# Patient Record
Sex: Female | Born: 1940 | Race: White | Hispanic: No | Marital: Married | Smoking: Former smoker
Health system: Southern US, Community
[De-identification: ages and names within clinical notes are randomized; demographics above are authoritative.]

## PROBLEM LIST (undated history)

## (undated) DIAGNOSIS — Z972 Presence of dental prosthetic device (complete) (partial): Secondary | ICD-10-CM

## (undated) DIAGNOSIS — J4489 Other specified chronic obstructive pulmonary disease: Secondary | ICD-10-CM

## (undated) DIAGNOSIS — Z9889 Other specified postprocedural states: Secondary | ICD-10-CM

## (undated) DIAGNOSIS — I711 Thoracic aortic aneurysm, ruptured, unspecified: Secondary | ICD-10-CM

## (undated) DIAGNOSIS — R1013 Epigastric pain: Secondary | ICD-10-CM

## (undated) DIAGNOSIS — Z5189 Encounter for other specified aftercare: Secondary | ICD-10-CM

## (undated) DIAGNOSIS — N390 Urinary tract infection, site not specified: Secondary | ICD-10-CM

## (undated) DIAGNOSIS — Z1239 Encounter for other screening for malignant neoplasm of breast: Secondary | ICD-10-CM

## (undated) DIAGNOSIS — Z923 Personal history of irradiation: Secondary | ICD-10-CM

## (undated) DIAGNOSIS — IMO0001 Reserved for inherently not codable concepts without codable children: Secondary | ICD-10-CM

## (undated) DIAGNOSIS — C50919 Malignant neoplasm of unspecified site of unspecified female breast: Secondary | ICD-10-CM

## (undated) DIAGNOSIS — R0602 Shortness of breath: Secondary | ICD-10-CM

## (undated) DIAGNOSIS — I714 Abdominal aortic aneurysm, without rupture, unspecified: Secondary | ICD-10-CM

## (undated) DIAGNOSIS — M810 Age-related osteoporosis without current pathological fracture: Secondary | ICD-10-CM

## (undated) DIAGNOSIS — E785 Hyperlipidemia, unspecified: Secondary | ICD-10-CM

## (undated) DIAGNOSIS — R0789 Other chest pain: Secondary | ICD-10-CM

## (undated) DIAGNOSIS — M1990 Unspecified osteoarthritis, unspecified site: Secondary | ICD-10-CM

## (undated) DIAGNOSIS — E041 Nontoxic single thyroid nodule: Secondary | ICD-10-CM

## (undated) DIAGNOSIS — E039 Hypothyroidism, unspecified: Secondary | ICD-10-CM

## (undated) DIAGNOSIS — I2510 Atherosclerotic heart disease of native coronary artery without angina pectoris: Secondary | ICD-10-CM

## (undated) DIAGNOSIS — I739 Peripheral vascular disease, unspecified: Secondary | ICD-10-CM

## (undated) DIAGNOSIS — R112 Nausea with vomiting, unspecified: Secondary | ICD-10-CM

## (undated) DIAGNOSIS — M542 Cervicalgia: Secondary | ICD-10-CM

## (undated) DIAGNOSIS — D0592 Unspecified type of carcinoma in situ of left breast: Secondary | ICD-10-CM

## (undated) DIAGNOSIS — D485 Neoplasm of uncertain behavior of skin: Secondary | ICD-10-CM

## (undated) DIAGNOSIS — I10 Essential (primary) hypertension: Secondary | ICD-10-CM

## (undated) DIAGNOSIS — I219 Acute myocardial infarction, unspecified: Secondary | ICD-10-CM

## (undated) DIAGNOSIS — C801 Malignant (primary) neoplasm, unspecified: Secondary | ICD-10-CM

## (undated) DIAGNOSIS — H9319 Tinnitus, unspecified ear: Secondary | ICD-10-CM

## (undated) DIAGNOSIS — J449 Chronic obstructive pulmonary disease, unspecified: Secondary | ICD-10-CM

## (undated) HISTORY — DX: Peripheral vascular disease, unspecified: I73.9

## (undated) HISTORY — DX: Other chest pain: R07.89

## (undated) HISTORY — DX: Essential (primary) hypertension: I10

## (undated) HISTORY — DX: Age-related osteoporosis without current pathological fracture: M81.0

## (undated) HISTORY — DX: Hyperlipidemia, unspecified: E78.5

## (undated) HISTORY — DX: Neoplasm of uncertain behavior of skin: D48.5

## (undated) HISTORY — DX: Cervicalgia: M54.2

## (undated) HISTORY — DX: Chronic obstructive pulmonary disease, unspecified: J44.9

## (undated) HISTORY — DX: Epigastric pain: R10.13

## (undated) HISTORY — DX: Thoracic aortic aneurysm, ruptured: I71.1

## (undated) HISTORY — DX: Abdominal aortic aneurysm, without rupture, unspecified: I71.40

## (undated) HISTORY — DX: Malignant (primary) neoplasm, unspecified: C80.1

## (undated) HISTORY — DX: Unspecified type of carcinoma in situ of left breast: D05.92

## (undated) HISTORY — DX: Abdominal aortic aneurysm, without rupture: I71.4

## (undated) HISTORY — DX: Encounter for other screening for malignant neoplasm of breast: Z12.39

## (undated) HISTORY — DX: Unspecified osteoarthritis, unspecified site: M19.90

## (undated) HISTORY — DX: Other specified chronic obstructive pulmonary disease: J44.89

## (undated) HISTORY — DX: Thoracic aortic aneurysm, ruptured, unspecified: I71.10

## (undated) HISTORY — DX: Atherosclerotic heart disease of native coronary artery without angina pectoris: I25.10

## (undated) HISTORY — DX: Acute myocardial infarction, unspecified: I21.9

## (undated) HISTORY — DX: Hypothyroidism, unspecified: E03.9

## (undated) HISTORY — PX: ABDOMINAL HYSTERECTOMY: SHX81

## (undated) HISTORY — DX: Tinnitus, unspecified ear: H93.19

---

## 1977-04-17 HISTORY — PX: OTHER SURGICAL HISTORY: SHX169

## 2002-04-17 HISTORY — PX: PORT WINE STAIN REMOVAL W/ LASER: SHX2245

## 2004-01-26 ENCOUNTER — Ambulatory Visit: Payer: Self-pay | Admitting: Urology

## 2004-03-07 ENCOUNTER — Other Ambulatory Visit: Payer: Self-pay

## 2004-03-09 ENCOUNTER — Ambulatory Visit: Payer: Self-pay | Admitting: Urology

## 2004-03-09 ENCOUNTER — Encounter: Payer: Self-pay | Admitting: Family Medicine

## 2004-11-10 ENCOUNTER — Ambulatory Visit: Payer: Self-pay | Admitting: Unknown Physician Specialty

## 2005-04-17 DIAGNOSIS — I219 Acute myocardial infarction, unspecified: Secondary | ICD-10-CM

## 2005-04-17 DIAGNOSIS — I2510 Atherosclerotic heart disease of native coronary artery without angina pectoris: Secondary | ICD-10-CM

## 2005-04-17 HISTORY — DX: Acute myocardial infarction, unspecified: I21.9

## 2005-04-17 HISTORY — DX: Atherosclerotic heart disease of native coronary artery without angina pectoris: I25.10

## 2005-11-15 HISTORY — PX: OTHER SURGICAL HISTORY: SHX169

## 2005-11-15 HISTORY — PX: CORONARY ARTERY BYPASS GRAFT: SHX141

## 2005-11-24 ENCOUNTER — Ambulatory Visit: Payer: Self-pay | Admitting: Emergency Medicine

## 2005-11-24 ENCOUNTER — Ambulatory Visit: Payer: Self-pay | Admitting: Cardiology

## 2005-11-24 ENCOUNTER — Inpatient Hospital Stay (HOSPITAL_COMMUNITY): Admission: EM | Admit: 2005-11-24 | Discharge: 2005-12-05 | Payer: Self-pay | Admitting: Emergency Medicine

## 2005-11-27 ENCOUNTER — Encounter: Payer: Self-pay | Admitting: Cardiology

## 2005-11-28 ENCOUNTER — Encounter: Payer: Self-pay | Admitting: Vascular Surgery

## 2005-12-21 ENCOUNTER — Ambulatory Visit: Payer: Self-pay | Admitting: Cardiology

## 2006-08-02 ENCOUNTER — Ambulatory Visit: Payer: Self-pay | Admitting: Cardiology

## 2007-04-15 ENCOUNTER — Ambulatory Visit: Payer: Self-pay | Admitting: Family Medicine

## 2007-04-15 DIAGNOSIS — M1990 Unspecified osteoarthritis, unspecified site: Secondary | ICD-10-CM | POA: Insufficient documentation

## 2007-04-15 DIAGNOSIS — I2510 Atherosclerotic heart disease of native coronary artery without angina pectoris: Secondary | ICD-10-CM | POA: Insufficient documentation

## 2007-04-15 DIAGNOSIS — I10 Essential (primary) hypertension: Secondary | ICD-10-CM

## 2007-04-15 DIAGNOSIS — E039 Hypothyroidism, unspecified: Secondary | ICD-10-CM | POA: Insufficient documentation

## 2007-04-15 DIAGNOSIS — E785 Hyperlipidemia, unspecified: Secondary | ICD-10-CM | POA: Insufficient documentation

## 2007-05-07 ENCOUNTER — Encounter: Payer: Self-pay | Admitting: Family Medicine

## 2007-05-13 ENCOUNTER — Ambulatory Visit: Payer: Self-pay | Admitting: Family Medicine

## 2007-05-14 LAB — CONVERTED CEMR LAB
ALT: 20 units/L (ref 0–35)
AST: 23 units/L (ref 0–37)
Albumin: 4.2 g/dL (ref 3.5–5.2)
Alkaline Phosphatase: 64 units/L (ref 39–117)
BUN: 10 mg/dL (ref 6–23)
Bilirubin, Direct: 0.1 mg/dL (ref 0.0–0.3)
CO2: 30 meq/L (ref 19–32)
Calcium: 9.5 mg/dL (ref 8.4–10.5)
Chloride: 102 meq/L (ref 96–112)
Cholesterol: 139 mg/dL (ref 0–200)
Creatinine, Ser: 0.9 mg/dL (ref 0.4–1.2)
GFR calc Af Amer: 80 mL/min
GFR calc non Af Amer: 66 mL/min
Glucose, Bld: 95 mg/dL (ref 70–99)
HDL: 60.3 mg/dL (ref >39.0)
LDL Cholesterol: 59 mg/dL (ref 0–99)
Potassium: 4.7 meq/L (ref 3.5–5.1)
Sodium: 138 meq/L (ref 135–145)
Total Bilirubin: 0.7 mg/dL (ref 0.3–1.2)
Total CHOL/HDL Ratio: 2.3
Total Protein: 7.7 g/dL (ref 6.0–8.3)
Triglycerides: 99 mg/dL (ref 0–149)
VLDL: 20 mg/dL (ref 0–40)

## 2007-05-15 ENCOUNTER — Ambulatory Visit: Payer: Self-pay | Admitting: Family Medicine

## 2007-05-16 ENCOUNTER — Ambulatory Visit: Payer: Self-pay | Admitting: Family Medicine

## 2007-05-16 ENCOUNTER — Encounter: Payer: Self-pay | Admitting: Family Medicine

## 2007-05-17 ENCOUNTER — Encounter: Payer: Self-pay | Admitting: Family Medicine

## 2007-05-17 ENCOUNTER — Ambulatory Visit: Payer: Self-pay | Admitting: Gastroenterology

## 2007-06-11 ENCOUNTER — Ambulatory Visit: Payer: Self-pay | Admitting: Gastroenterology

## 2007-06-11 ENCOUNTER — Encounter: Payer: Self-pay | Admitting: Family Medicine

## 2007-08-15 ENCOUNTER — Ambulatory Visit: Payer: Self-pay | Admitting: Family Medicine

## 2007-08-15 DIAGNOSIS — D485 Neoplasm of uncertain behavior of skin: Secondary | ICD-10-CM

## 2007-08-21 DIAGNOSIS — I714 Abdominal aortic aneurysm, without rupture, unspecified: Secondary | ICD-10-CM | POA: Insufficient documentation

## 2007-08-26 ENCOUNTER — Ambulatory Visit: Payer: Self-pay | Admitting: Family Medicine

## 2007-11-19 ENCOUNTER — Ambulatory Visit: Payer: Self-pay | Admitting: Cardiovascular Disease

## 2007-11-20 ENCOUNTER — Ambulatory Visit: Payer: Self-pay | Admitting: Urology

## 2007-11-25 ENCOUNTER — Ambulatory Visit: Payer: Self-pay | Admitting: Urology

## 2008-02-05 ENCOUNTER — Ambulatory Visit: Payer: Self-pay | Admitting: Family Medicine

## 2008-02-05 DIAGNOSIS — G43009 Migraine without aura, not intractable, without status migrainosus: Secondary | ICD-10-CM | POA: Insufficient documentation

## 2008-02-17 ENCOUNTER — Ambulatory Visit: Payer: Self-pay | Admitting: Family Medicine

## 2008-02-26 ENCOUNTER — Ambulatory Visit: Payer: Self-pay | Admitting: Family Medicine

## 2008-02-26 ENCOUNTER — Encounter: Payer: Self-pay | Admitting: Family Medicine

## 2008-03-17 ENCOUNTER — Ambulatory Visit: Payer: Self-pay | Admitting: Vascular Surgery

## 2008-03-19 ENCOUNTER — Encounter: Admission: RE | Admit: 2008-03-19 | Discharge: 2008-03-19 | Payer: Self-pay | Admitting: Vascular Surgery

## 2008-05-21 ENCOUNTER — Ambulatory Visit: Payer: Self-pay | Admitting: Family Medicine

## 2008-05-25 LAB — CONVERTED CEMR LAB
Albumin: 4.2 g/dL (ref 3.5–5.2)
BUN: 10 mg/dL (ref 6–23)
Cholesterol: 133 mg/dL (ref 0–200)
Creatinine, Ser: 0.8 mg/dL (ref 0.4–1.2)
GFR calc Af Amer: 92 mL/min
GFR calc non Af Amer: 76 mL/min
LDL Cholesterol: 57 mg/dL (ref 0–99)
TSH: 0.29 microintl units/mL — ABNORMAL LOW (ref 0.35–5.50)
Total Bilirubin: 0.5 mg/dL (ref 0.3–1.2)
Total CHOL/HDL Ratio: 2.4
Triglycerides: 105 mg/dL (ref 0–149)
VLDL: 21 mg/dL (ref 0–40)

## 2008-05-26 ENCOUNTER — Ambulatory Visit: Payer: Self-pay

## 2008-05-27 ENCOUNTER — Ambulatory Visit: Payer: Self-pay | Admitting: Family Medicine

## 2008-05-27 DIAGNOSIS — M810 Age-related osteoporosis without current pathological fracture: Secondary | ICD-10-CM

## 2008-05-27 LAB — HM COLONOSCOPY

## 2008-05-27 LAB — CONVERTED CEMR LAB
Cholesterol, target level: 200 mg/dL
HDL goal, serum: 40 mg/dL
LDL Goal: 100 mg/dL

## 2008-06-01 ENCOUNTER — Ambulatory Visit: Payer: Self-pay | Admitting: Cardiovascular Disease

## 2008-06-18 ENCOUNTER — Ambulatory Visit: Payer: Self-pay | Admitting: Family Medicine

## 2008-06-18 ENCOUNTER — Encounter: Payer: Self-pay | Admitting: Family Medicine

## 2008-06-22 ENCOUNTER — Encounter (INDEPENDENT_AMBULATORY_CARE_PROVIDER_SITE_OTHER): Payer: Self-pay | Admitting: *Deleted

## 2008-06-23 ENCOUNTER — Ambulatory Visit: Payer: Self-pay | Admitting: Family Medicine

## 2008-07-06 ENCOUNTER — Ambulatory Visit: Payer: Self-pay | Admitting: Family Medicine

## 2008-10-13 ENCOUNTER — Ambulatory Visit: Payer: Self-pay | Admitting: Vascular Surgery

## 2008-11-30 ENCOUNTER — Ambulatory Visit: Payer: Self-pay | Admitting: Family Medicine

## 2009-01-15 ENCOUNTER — Ambulatory Visit: Payer: Self-pay | Admitting: Family Medicine

## 2009-01-15 DIAGNOSIS — J449 Chronic obstructive pulmonary disease, unspecified: Secondary | ICD-10-CM | POA: Insufficient documentation

## 2009-04-08 ENCOUNTER — Encounter: Admission: RE | Admit: 2009-04-08 | Discharge: 2009-04-08 | Payer: Self-pay | Admitting: Vascular Surgery

## 2009-04-08 ENCOUNTER — Ambulatory Visit: Payer: Self-pay | Admitting: Vascular Surgery

## 2009-04-28 ENCOUNTER — Telehealth: Payer: Self-pay | Admitting: Family Medicine

## 2009-05-07 ENCOUNTER — Ambulatory Visit: Payer: Self-pay | Admitting: Family Medicine

## 2009-05-07 DIAGNOSIS — R071 Chest pain on breathing: Secondary | ICD-10-CM | POA: Insufficient documentation

## 2009-06-30 ENCOUNTER — Ambulatory Visit: Payer: Self-pay | Admitting: Family Medicine

## 2009-06-30 LAB — CONVERTED CEMR LAB
BUN: 12 mg/dL (ref 6–23)
Cholesterol: 142 mg/dL (ref 0–200)
Creatinine, Ser: 1.1 mg/dL (ref 0.4–1.2)
GFR calc non Af Amer: 52.26 mL/min (ref >60)
HDL: 62.8 mg/dL (ref >39.00)
LDL Cholesterol: 58 mg/dL (ref 0–99)
TSH: 0.68 microintl units/mL (ref 0.35–5.50)
Total Bilirubin: 0.3 mg/dL (ref 0.3–1.2)
Triglycerides: 105 mg/dL (ref 0.0–149.0)
VLDL: 21 mg/dL (ref 0.0–40.0)

## 2009-07-05 ENCOUNTER — Encounter: Payer: Self-pay | Admitting: Family Medicine

## 2009-07-06 ENCOUNTER — Encounter: Payer: Self-pay | Admitting: Family Medicine

## 2009-07-06 ENCOUNTER — Ambulatory Visit: Payer: Self-pay | Admitting: Family Medicine

## 2009-07-30 ENCOUNTER — Encounter: Payer: Self-pay | Admitting: Family Medicine

## 2009-07-30 ENCOUNTER — Ambulatory Visit: Payer: Self-pay | Admitting: Family Medicine

## 2009-08-03 DIAGNOSIS — R928 Other abnormal and inconclusive findings on diagnostic imaging of breast: Secondary | ICD-10-CM | POA: Insufficient documentation

## 2009-08-10 ENCOUNTER — Encounter: Payer: Self-pay | Admitting: Family Medicine

## 2009-08-10 ENCOUNTER — Ambulatory Visit: Payer: Self-pay | Admitting: Family Medicine

## 2009-08-12 ENCOUNTER — Encounter (INDEPENDENT_AMBULATORY_CARE_PROVIDER_SITE_OTHER): Payer: Self-pay | Admitting: *Deleted

## 2009-10-14 ENCOUNTER — Ambulatory Visit: Payer: Self-pay | Admitting: Vascular Surgery

## 2010-01-05 ENCOUNTER — Ambulatory Visit: Payer: Self-pay | Admitting: Family Medicine

## 2010-01-05 LAB — CONVERTED CEMR LAB
AST: 18 units/L (ref 0–37)
Albumin: 4.1 g/dL (ref 3.5–5.2)
BUN: 10 mg/dL (ref 6–23)
CO2: 27 meq/L (ref 19–32)
Calcium: 9.5 mg/dL (ref 8.4–10.5)
Cholesterol: 125 mg/dL (ref 0–200)
GFR calc non Af Amer: 67.5 mL/min (ref >60)
Glucose, Bld: 96 mg/dL (ref 70–99)
HDL: 46.4 mg/dL (ref >39.00)
Potassium: 5 meq/L (ref 3.5–5.1)
Total Protein: 7.1 g/dL (ref 6.0–8.3)
VLDL: 18.4 mg/dL (ref 0.0–40.0)

## 2010-01-12 ENCOUNTER — Ambulatory Visit: Payer: Self-pay | Admitting: Family Medicine

## 2010-01-12 DIAGNOSIS — M79609 Pain in unspecified limb: Secondary | ICD-10-CM

## 2010-01-26 ENCOUNTER — Encounter: Payer: Self-pay | Admitting: Family Medicine

## 2010-01-26 ENCOUNTER — Ambulatory Visit: Payer: Self-pay | Admitting: Family Medicine

## 2010-01-27 ENCOUNTER — Encounter: Payer: Self-pay | Admitting: Family Medicine

## 2010-01-27 ENCOUNTER — Ambulatory Visit: Payer: Self-pay

## 2010-02-03 LAB — HM MAMMOGRAPHY

## 2010-02-04 ENCOUNTER — Encounter (INDEPENDENT_AMBULATORY_CARE_PROVIDER_SITE_OTHER): Payer: Self-pay | Admitting: *Deleted

## 2010-03-21 ENCOUNTER — Telehealth: Payer: Self-pay | Admitting: Family Medicine

## 2010-04-07 ENCOUNTER — Encounter: Payer: Self-pay | Admitting: Family Medicine

## 2010-04-07 ENCOUNTER — Ambulatory Visit: Payer: Self-pay | Admitting: Internal Medicine

## 2010-04-07 DIAGNOSIS — M546 Pain in thoracic spine: Secondary | ICD-10-CM

## 2010-05-08 ENCOUNTER — Encounter: Payer: Self-pay | Admitting: Family Medicine

## 2010-05-15 LAB — CONVERTED CEMR LAB
ALT: 15 units/L (ref 0–35)
BUN: 12 mg/dL (ref 6–23)
Basophils Absolute: 0.1 10*3/uL (ref 0.0–0.1)
Bilirubin, Direct: 0 mg/dL (ref 0.0–0.3)
Chloride: 103 meq/L (ref 96–112)
Cholesterol: 131 mg/dL (ref 0–200)
Creatinine, Ser: 0.7 mg/dL (ref 0.4–1.2)
Creatinine, Ser: 0.8 mg/dL (ref 0.4–1.2)
Eosinophils Absolute: 0.2 10*3/uL (ref 0.0–0.7)
GFR calc non Af Amer: 75.6 mL/min (ref >60)
LDL Cholesterol: 61 mg/dL (ref 0–99)
Lymphocytes Relative: 20.7 % (ref 12.0–46.0)
MCHC: 34.3 g/dL (ref 30.0–36.0)
Neutro Abs: 6.4 10*3/uL (ref 1.4–7.7)
Neutrophils Relative %: 68.8 % (ref 43.0–77.0)
Platelets: 266 10*3/uL (ref 150.0–400.0)
Potassium: 4.6 meq/L (ref 3.5–5.1)
Potassium: 4.8 meq/L (ref 3.5–5.1)
RDW: 13.9 % (ref 11.5–14.6)
Sodium: 140 meq/L (ref 135–145)
Total Bilirubin: 0.6 mg/dL (ref 0.3–1.2)
Total Bilirubin: 0.6 mg/dL (ref 0.3–1.2)
Triglycerides: 84 mg/dL (ref 0.0–149.0)
VLDL: 16.8 mg/dL (ref 0.0–40.0)

## 2010-05-18 NOTE — Assessment & Plan Note (Signed)
Summary: CPX  CYD   Vital Signs:  Patient profile:   70 year old female Height:      65.75 inches Weight:      138.2 pounds BMI:     22.56 Temp:     98.1 degrees F oral Pulse rate:   52 / minute Pulse rhythm:   regular BP sitting:   120 / 66  (left arm) Cuff size:   regular  Vitals Entered By: Benny Lennert CMA Duncan Dull) (July 06, 2009 11:21 AM)  History of Present Illness: Chief complaint chest for 1 motnh shooting pain at  times and rib pain  Chest pain, atypical in pt with history of CAD, s/p CABG 2007.   Has been ongoing for past 2 months...saeen in 04/2009..felt muscle spasm due to lifting.  No longer pain associated pain with moving right arm. Intermittant , lasts 5 min at a time.  When bends over..feels pressure..feels like something in her chest. EKG today shows no clear changes compared to 03/2007  No associated breast pain, SOB, or eating. No exertional component. No reflux symptoms.   Hypertension History:      She complains of chest pain, but denies headache, palpitations, dyspnea with exertion, peripheral edema, and syncope.  She notes the following problems with antihypertensive medication side effects: well controlled at home. [name]        Positive major cardiovascular risk factors include female age 1 years old or older, hyperlipidemia, and hypertension.  Negative major cardiovascular risk factors include non-tobacco-user status.        Positive history for target organ damage include ASHD (either angina/prior MI/prior CABG) and peripheral vascular disease.     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 Take 1 Tablet By Mouth Once A Day 2)  Tenormin 25 Mg  Tabs (Atenolol) .... Take 1 Tablet By Mouth Once A Day 3)  Simvastatin 80 Mg  Tabs (Simvastatin) .... Take 1 Tablet By Mouth Once A Day 4)  Aspirin 81 Mg  Tabs (Aspirin) .... Take 1 Tablet By Mouth Once A Day 5)  Levothyroxine Sodium 25 Mcg Tabs (Levothyroxine Sodium) .[name].. 1 Tab By Mouth Every  Day 6)  Spiriva Handihaler 18 Mcg Caps (Tiotropium Bromide Monohydrate) .[name].. 1 Inh Daily  Allergies: 1)  ! Prednisone 2)  ! * Iv Dye  Past History:  Past medical, surgical, family and social histories (including risk factors) reviewed, and no changes noted (except as noted below).  Past Medical History: Reviewed history from 02/11/2009 and no changes required. EPIGASTRIC PAIN (ICD-789.06) OSTEOPOROSIS (ICD-733.00) NECK PAIN, ACUTE (ICD-723.1) TINNITUS (ICD-388.30) HEADACHE (ICD-784.0) COMMON MIGRAINE (ICD-346.10) NEOPLASM, SKIN, UNCERTAIN BEHAVIOR (ICD-238.2) ABDOMINAL AORTIC ANEURYSM (ICD-441.4) CHEST PAIN, ATYPICAL (ICD-786.59) SCREENING FOR MLIG NEOP, BREAST, NOS (ICD-V76.10) HYPERTENSION (ICD-401.9) HYPERLIPIDEMIA (ICD-272.4) HYPOTHYROIDISM (ICD-244.9) OSTEOARTHRITIS (ICD-715.90) CHRONIC OBSTRUCTIVE PULMONARY DISEASE, MODERATE (ICD-496) CAD (ICD-414.00)  Past Surgical History: Reviewed history from 04/15/2007 and no changes required. 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 father alzheimer's, parkinson's mother: polio, enlarged heart, MI brother: healthy sister: HTN, hypothyroid sister: DM sister: HTN, breast cancer PGF: aneurysm no cancer  Social History: Reviewed history from  08/15/2007 and no changes required. Occupation: housewife  Married Former Smoker, 40 pack years Alcohol use-no Drug use-no Regular exercise-no Regular exercise-yes, walk  Physical Exam  General:  thin appearing elderly female inNAd Ears:  External ear exam shows no significant lesions or deformities.  Otoscopic examination reveals clear canals, tympanic membranes are intact bilaterally without bulging, retraction, inflammation or discharge. Hearing is grossly normal bilaterally. Nose:  External nasal examination shows no deformity or inflammation. Nasal mucosa are pink and moist without lesions or exudates. Mouth:  Oral mucosa and oropharynx without lesions or exudates.  Teeth in good repair. Neck:  no cervical or supraclavicular lymphadenopathy. no carotid bruit or thyromegaly  Chest Wall:  ttp right upper chest wall,  Breasts:  No mass, nodules, thickening, tenderness, bulging, retraction, inflamation, nipple discharge or skin changes noted.   Lungs:  Normal respiratory effort, chest expands symmetrically. Lungs are clear to auscultation, no crackles or wheezes. Heart:  Normal rate and regular rhythm. S1 and S2 normal without gallop, murmur, click, rub or other extra sounds. Abdomen:  Bowel sounds positive,abdomen soft and mild left lower quadrant ttp without masses, organomegaly or hernias noted. Msk:  No deformity or scoliosis noted of thoracic or lumbar spine.   Full ROM B shoulders, decresas RPM in B hips, mild Pulses:  R and L posterior tibial pulses are full and equal bilaterally  Extremities:  no edema  Neurologic:  No cranial nerve deficits noted. Station and gait are normal. Sensory, motor and coordinative functions appear intact.   Impression & Recommendations:  Problem # 1:  CHEST WALL PAIN, ANTERIOR (ICD-786.52) Smoker..eval with CXR: no lung pathology..circular metal clips from surgery? in area of tenderness Pending mammogram but normal breast exam.  Chest wall tender  to palpation...near CABG scar..? pain due to [facility] source or scarring. Await radiaologist reading of CXR.  Suggest followup with cards sooner than 08/2009 to assure no further cardiac eval needed, but pain is nonexertional.   Her updated medication list for this problem includes:    Aspirin 81 Mg Tabs (Aspirin) .[name]... Take 1 tablet by mouth once a day  Orders: CXR- 2view (CXR) EKG w/ Interpretation (93000)  Problem # 2:  HYPERTENSION (ICD-401.9)  Well controlled. Continue current medication.  Her updated medication list for this problem includes:    Benazepril Hcl 10 Mg Tabs (Benazepril hcl) .[name]... Take 1 tablet by mouth once a day    Tenormin 25 Mg Tabs (Atenolol) .[name]... Take 1 tablet by mouth once a day  BP today: 120/66 Prior BP: 110/70 (05/07/2009)  Prior 10 Yr Risk Heart Disease: N/A (02/17/2008)  Labs Reviewed: K+: 4.6 (06/30/2009) Creat: : 1.1 (06/30/2009)   Chol: 142 (06/30/2009)   HDL: 62.80 (06/30/2009)   LDL: 58 (06/30/2009)   TG: 105.0 (06/30/2009)  Problem # 3:  HYPERLIPIDEMIA (ICD-272.4)  Well controlled. Continue current medication.  Her updated medication list for this problem includes:    Simvastatin 80 Mg Tabs (Simvastatin) .[name]... Take 1 tablet by mouth once a day  Labs Reviewed: SGOT: 17 (06/30/2009)   SGPT: 15 (06/30/2009)  Lipid Goals: Chol Goal: 200 (05/27/2008)   HDL Goal: 40 (05/27/2008)   LDL Goal: 100 (05/27/2008)   TG Goal: 150 (05/27/2008)  Prior 10 Yr Risk Heart  Disease: N/A (02/17/2008)   HDL:62.80 (06/30/2009), 53.70 (11/30/2008)  LDL:58 (06/30/2009), 61 (11/30/2008)  Chol:142 (06/30/2009), 131 (11/30/2008)  Trig:105.0 (06/30/2009), 84.0 (11/30/2008)  Problem # 4:  HYPOTHYROIDISM (ICD-244.9)  Well controlled. Continue current medication.  Her updated medication list for this problem includes:    Levothyroxine Sodium 25 Mcg Tabs (Levothyroxine sodium) .[name]... 1 tab by mouth every  day  Labs Reviewed: TSH: 0.68 (06/30/2009)    Chol: 142  (06/30/2009)   HDL: 62.80 (06/30/2009)   LDL: 58 (06/30/2009)   TG: 105.0 (06/30/2009)  Complete Medication List: 1)  Benazepril Hcl 10 Mg Tabs (Benazepril hcl) .... Take 1 tablet by mouth once a day 2)  Tenormin 25 Mg Tabs (Atenolol) .... Take 1 tablet by mouth once a day 3)  Simvastatin 80 Mg Tabs (Simvastatin) .... Take 1 tablet by mouth once a day 4)  Aspirin 81 Mg Tabs (Aspirin) .... Take 1 tablet by mouth once a day 5)  Levothyroxine Sodium 25 Mcg Tabs (Levothyroxine sodium) .[name].. 1 tab by mouth every  day 6)  Spiriva Handihaler 18 Mcg Caps (Tiotropium bromide monohydrate) .[name].. 1 inh daily  Other Orders: Radiology Referral (Radiology)  Hypertension Assessment/Plan:      The patient's hypertensive risk group is category C: Target organ damage and/or diabetes.  Today's blood pressure is 120/66.  Her blood pressure goal is < 140/90.  Patient Instructions: 1)  Referral Appointment Information 2)  Day/Date: 3)  Time: 4)  Place/MD: 5)  Address: 6)  Phone/Fax: 7)  Patient given appointment information. Information/Orders faxed/mailed.  8)  Fasting LIPIDS, CMET in 6 months Dx 272.0. 9)  Please schedule a follow-up appointment in 6 months HTN.  10)  Call cardiology to move appt sooner to eval chest pain atypical.   Current Allergies (reviewed today): ! PREDNISONE ! * IV DYE  Last Flu Vaccine:  Fluvax 3+ (01/15/2009 2:53:41 PM) Flu Vaccine Next Due:  1 yr Bone Density Next Due: Refused   Past Medical History:    Reviewed history from 02/11/2009 and no changes required:       EPIGASTRIC PAIN (ICD-789.06)       OSTEOPOROSIS (ICD-733.00)       NECK PAIN, ACUTE (ICD-723.1)       TINNITUS (ICD-388.30)       HEADACHE (ICD-784.0)       COMMON MIGRAINE (ICD-346.10)       NEOPLASM, SKIN, UNCERTAIN BEHAVIOR (ICD-238.2)       ABDOMINAL AORTIC ANEURYSM (ICD-441.4)       CHEST PAIN, ATYPICAL (ICD-786.59)       SCREENING FOR MLIG NEOP, BREAST, NOS (ICD-V76.10)       HYPERTENSION  (ICD-401.9)       HYPERLIPIDEMIA (ICD-272.4)       HYPOTHYROIDISM (ICD-244.9)       OSTEOARTHRITIS (ICD-715.90)       CHRONIC OBSTRUCTIVE PULMONARY DISEASE, MODERATE (ICD-496)       CAD (ICD-414.00)  Past Surgical History:    Reviewed history from 04/15/2007 and no changes required:       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

## 2010-05-18 NOTE — Letter (Signed)
Summary: Results Follow up Letter   at [facility]  [address] [place], [place] [address]   Phone: [phone]  Fax: [phone]    08/12/2009 MRN: [id number]     Amy Stone [address] [place], [place]  [address]    Dear Ms. Habermehl,  The following are the results of your recent test(s):  Test         Result    Pap Smear:        Normal _____  Not Normal _____ Comments: ______________________________________________________ Cholesterol: LDL(Bad cholesterol):         Your goal is less than:         HDL (Good cholesterol):       Your goal is more than: Comments:  ______________________________________________________ Mammogram:        Normal _____  Not Normal __x___ Comments:Abnormal right repeat in 6 months  ___________________________________________________________________ Hemoccult:        Normal _____  Not normal _______ Comments:    _____________________________________________________________________ Other Tests:    We routinely do not discuss normal results over the telephone.  If you desire a copy of the results, or you have any questions about this information we can discuss them at your next office visit.   Sincerely,   Kerby Nora MD

## 2010-05-18 NOTE — Assessment & Plan Note (Signed)
Summary: 6 MONTH FOLLOWUP/[facility]H   Vital Signs:  Patient profile:   70 year old female Height:      65.75 inches Weight:      138.0 pounds BMI:     22.52 Temp:     98.7 degrees F oral Pulse rate:   52 / minute Pulse rhythm:   regular BP sitting:   108 / 72  (left arm) Cuff size:   regular  Vitals Entered By: Benny Lennert CMA Duncan Dull) (January 12, 2010 8:23 AM)  History of Present Illness: Chief complaint 6 month follow up  Abnormal mammogram in 07/2009...next due in 6 mnths....schedule 10/12  Atypical chest pain at last appt...thought to likely be sue to clips from past cardiac surgery. Since then no further central chest pain, but some pressure in right breast only when moving right arm.  B legs hurt when walking..points to hips down, right buttock pain no numbness, no tingling, no weakness. Has to stop every few minutes to let leg feel better, then starts again. Pt reports 2 years ago had [place] B legs..nml  She does state has to move legs at night to get comfortable. Feels like water running down legs on inside. Intermittant low back pain. Sisters have same issue.  Leg pain has been going on for several years...may be associated with wehen she started chol med.  She is not interested in stopping high dose simvastatin.   Has noted decreased hearing in past few years.   Lipid Management History:      Positive NCEP/ATP III risk factors include female age 3 years old or older, hypertension, ASHD (either angina/prior MI/prior CABG), peripheral vascular disease, and aortic aneurysm.  Negative NCEP/ATP III risk factors include non-tobacco-user status.        The patient states that she knows about the "Therapeutic Lifestyle Change" diet.  Her compliance with the TLC diet is good.  The patient expresses understanding of adjunctive measures for cholesterol lowering.  Adjunctive measures started by the patient include aerobic exercise, limit alcohol consumpton, and weight reduction.   She expresses no side effects from her lipid-lowering medication.  The patient denies any symptoms to suggest myopathy or liver disease.     Problems Prior to Update: 1)  Abnormal Mammogram  (ICD-793.80) 2)  Chest Wall Pain, Anterior  (ICD-786.52) 3)  Osteoporosis  (ICD-733.00) 4)  Common Migraine  (ICD-346.10) 5)  Neoplasm, Skin, Uncertain Behavior  (ICD-238.2) 6)  Abdominal Aortic Aneurysm  (ICD-441.4) 7)  Screening For Mlig Neop, Breast, Nos  (ICD-V76.10) 8)  Hypertension  (ICD-401.9) 9)  Hyperlipidemia  (ICD-272.4) 10)  Hypothyroidism  (ICD-244.9) 11)  Osteoarthritis  (ICD-715.90) 12)  Chronic Obstructive Pulmonary Disease, Moderate  (ICD-496) 13)  Cad  (ICD-414.00)  Current Medications (verified): 1)  Benazepril Hcl 10 Mg  Tabs (Benazepril Hcl) .... Take 1 Tablet By Mouth Once A Day 2)  Tenormin 25 Mg  Tabs (Atenolol) .... Take 1 Tablet By Mouth Once A Day 3)  Simvastatin 80 Mg  Tabs (Simvastatin) .... Take 1 Tablet By Mouth Once A Day 4)  Aspirin 81 Mg  Tabs (Aspirin) .... Take 1 Tablet By Mouth Once A Day 5)  Levothyroxine Sodium 25 Mcg Tabs (Levothyroxine Sodium) .[name].. 1 Tab By Mouth Every  Day 6)  Spiriva Handihaler 18 Mcg Caps (Tiotropium Bromide Monohydrate) .[name].. 1 Inh Daily  Allergies: 1)  ! Prednisone 2)  ! * Iv Dye  Past History:  Past medical, surgical, family and social histories (including risk factors) reviewed, and no  changes noted (except as noted below).  Past Medical History: Reviewed history from 02/11/2009 and no changes required. EPIGASTRIC PAIN (ICD-789.06) OSTEOPOROSIS (ICD-733.00) NECK PAIN, ACUTE (ICD-723.1) TINNITUS (ICD-388.30) HEADACHE (ICD-784.0) COMMON MIGRAINE (ICD-346.10) NEOPLASM, SKIN, UNCERTAIN BEHAVIOR (ICD-238.2) ABDOMINAL AORTIC ANEURYSM (ICD-441.4) CHEST PAIN, ATYPICAL (ICD-786.59) SCREENING FOR MLIG NEOP, BREAST, NOS (ICD-V76.10) HYPERTENSION (ICD-401.9) HYPERLIPIDEMIA (ICD-272.4) HYPOTHYROIDISM (ICD-244.9) OSTEOARTHRITIS  (ICD-715.90) CHRONIC OBSTRUCTIVE PULMONARY DISEASE, MODERATE (ICD-496) CAD (ICD-414.00)  Past Surgical History: Reviewed history from 04/15/2007 and no changes required. 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 father alzheimer's, parkinson's mother: polio, enlarged heart, MI brother: healthy sister: HTN, hypothyroid sister: DM sister: HTN, breast cancer PGF: aneurysm no cancer  Social History: Reviewed history from 08/15/2007 and no changes required. 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 CV:  Denies palpitations. Resp:  Denies shortness of breath. GI:  Denies abdominal pain. GU:  Denies dysuria. Derm:  Denies rash.  Physical Exam  General:  Well-developed,well-nourished,in no acute distress; alert,appropriate and cooperative throughout examination Mouth:  Oral mucosa and oropharynx without lesions or exudates.  Teeth in good repair. Neck:  no carotid bruit or thyromegaly no cervical or supraclavicular lymphadenopathy  Lungs:  Normal respiratory effort, chest expands symmetrically. Lungs are clear to auscultation, no crackles or wheezes. Heart:  Normal rate and regular rhythm. S1 and S2 normal without gallop, murmur, click, rub or other extra sounds. Abdomen:  Bowel sounds positive,abdomen soft and non-tender without masses, organomegaly or hernias noted. Msk:  ttp in right buttock, and right low back. Neg SLR, neg Fabers  Slightly decrease hip ext rotation B Pulses:  diminished pulse B post tibialis and pedis Extremities:  no edema, B varicosities, mild Neurologic:  No cranial nerve deficits noted. Station and gait are normal. Plantar reflexes are  down-going bilaterally. DTRs are symmetrical throughout. Sensory, motor and coordinative functions appear intact. Skin:  Intact without suspicious lesions or rashes   Impression & Recommendations:  Problem # 1:  LEG PAIN, BILATERAL (ICD-729.5) Multifactorial: Some component during day of claudication symptoms...eval with ABIs.  ? due to high dose simvastatin.She refuses to change or stop medicaiton...if ABIs neg, check CK to assure no rhabdo.  Nighttime symtpos consistent with restless leg...symptoms are not bad enough to take med like requip.  Right buttock pains low back pain consistent with piriformis syndrome..no definatesciatica. Recommended tylenol for pain and , given low back gentle stretches, may use heat/ice.  Problem # 2:  ? of CLAUDICATION, INTERMITTENT (ICD-443.9)  Orders: Radiology Referral (Radiology)  Problem # 3:  ABNORMAL MAMMOGRAM (ICD-793.80) HAs scheduled recheck  Problem # 4:  HYPERTENSION (ICD-401.9)  Well controlled. Continue current medication.  Her updated medication list for this problem includes:    Benazepril Hcl 10 Mg Tabs (Benazepril hcl) .[name]... Take 1 tablet by mouth once a day    Tenormin 25 Mg Tabs (Atenolol) .[name]... Take 1 tablet by mouth once a day  BP today: 108/72 Prior BP: 120/66 (07/06/2009)  Prior 10 Yr Risk Heart Disease: N/A (02/17/2008)  Labs Reviewed: K+: 5.0 (01/05/2010) Creat: : 0.9 (01/05/2010)   Chol: 125 (01/05/2010)   HDL: 46.40 (01/05/2010)   LDL: 60 (01/05/2010)   TG: 92.0 (01/05/2010)  Problem # 5:  HYPERLIPIDEMIA (ICD-272.4) Well controlled. Continue current medication.  Her updated medication list  for this problem includes:    Simvastatin 80 Mg Tabs (Simvastatin) .[name]... Take 1 tablet by mouth once a day  Problem # 6:  CAD (ICD-414.00) Overdue for ECHo/follow up wiht Dr. Dicie Beam. Her updated medication list for this problem includes:    Benazepril Hcl 10 Mg Tabs (Benazepril hcl) .[name]... Take 1 tablet by mouth once a  day    Tenormin 25 Mg Tabs (Atenolol) .[name]... Take 1 tablet by mouth once a day    Aspirin 81 Mg Tabs (Aspirin) .[name]... Take 1 tablet by mouth once a day  Complete Medication List: 1)  Benazepril Hcl 10 Mg Tabs (Benazepril hcl) .... Take 1 tablet by mouth once a day 2)  Tenormin 25 Mg Tabs (Atenolol) .... Take 1 tablet by mouth once a day 3)  Simvastatin 80 Mg Tabs (Simvastatin) .... Take 1 tablet by mouth once a day 4)  Aspirin 81 Mg Tabs (Aspirin) .... Take 1 tablet by mouth once a day 5)  Levothyroxine Sodium 25 Mcg Tabs (Levothyroxine sodium) .[name].. 1 tab by mouth every  day 6)  Spiriva Handihaler 18 Mcg Caps (Tiotropium bromide monohydrate) .[name].. 1 inh daily  Other Orders: Flu Vaccine 30yrs + MEDICARE PATIENTS (Z6109) Administration Flu vaccine - MCR (G0008)  Lipid Assessment/Plan:      Based on NCEP/ATP III, the patient's risk factor category is "history of coronary disease, peripheral vascular disease, cerebrovascular disease, or aortic aneurysm".  The patient's lipid goals are as follows: Total cholesterol goal is 200; LDL cholesterol goal is 100; HDL cholesterol goal is 40; Triglyceride goal is 150.  Her LDL cholesterol goal has been met.    Patient Instructions: 1)  Appears overdue for follow up with  cardiologist..Marland KitchenMarion please call Dr. Dicie Beam to see needs to follow up...appears she may have never set up follow up ECHO. 2)  Please schedule a follow-up appointment in 6 months for CPX. 3)  BMP prior to visit, ICD-9: 401.1,  4)  Hepatic Panel prior to visit ICD-9:  5)  Lipid panel prior to visit ICD-9 :  6)  TSH prior to visit ICD-9 : 244.9 7)  CBC w/ Diff prior to visit ICD-9 :  8)  Referral Appointment Information 9)  Day/Date: 10)  Time: 11)  Place/MD: 12)  Address: 13)  Phone/Fax: 14)  Patient given appointment information. Information/Orders faxed/mailed.   Current Allergies (reviewed today): ! PREDNISONE ! * IV DYE             Flu Vaccine Consent Questions      Do you have a history of severe allergic reactions to this vaccine? no    Any prior history of allergic reactions to egg and/or gelatin? no    Do you have a sensitivity to the preservative Thimersol? no    Do you have a past history of Guillan-Barre Syndrome? no    Do you currently have an acute febrile illness? no    Have you ever had a severe reaction to latex? no    Vaccine information given and explained to patient? yes    Are you currently pregnant? no    Lot Number:[id number]   Exp Date:10/15/2010   Manufacturer: [employer]    Site Given  Left Deltoid IMdflu

## 2010-05-18 NOTE — Progress Notes (Signed)
Summary: change in personality  Phone Note Call from Patient Call back at [phone]   Caller: Daughter- Larita Fife Call For: Kerby Nora MD Summary of Call: Patient has an appointment scheduled for Jan 21 and the daughter called and wanted to give you a heads up about some concerns they are having regarding patient. She says that her personality has really changed and that she stays very agitated, and mean. They have tried to talk to her about it so that she would mention it to you at app and daughter says that she gets very mean and denies that she is acting this way. The daugther just really wants to make you aware of this for the upcoming app.  Initial call taken by: Melody Comas,  April 28, 2009 10:38 AM

## 2010-05-18 NOTE — Letter (Signed)
Summary: Results Follow up Letter  [name] at [facility]  [address] [place], [place] [address]   Phone: [phone]  Fax: [phone]    02/04/2010 MRN: [id number]    Amy Stone [address] [place], [place]  [address]    Dear Ms. Vasques,  The following are the results of your recent test(s):  Test         Result    Pap Smear:        Normal _____  Not Normal _____ Comments: ______________________________________________________ Cholesterol: LDL(Bad cholesterol):         Your goal is less than:         HDL (Good cholesterol):       Your goal is more than: Comments:  ______________________________________________________ Mammogram:        Normal __X___  Not Normal _____ Comments:  Yearly follow up is recommended.   ___________________________________________________________________ Hemoccult:        Normal _____  Not normal _______ Comments:    _____________________________________________________________________ Other Tests:    We routinely do not discuss normal results over the telephone.  If you desire a copy of the results, or you have any questions about this information we can discuss them at your next office visit.   Sincerely,    Kerby Nora, M.D.  AEB:lsf

## 2010-05-18 NOTE — Assessment & Plan Note (Signed)
Summary: F/U COPD, HTN  CYD   Vital Signs:  Patient profile:   70 year old female Height:      65.75 inches Weight:      138.2 pounds BMI:     22.56 Temp:     98.2 degrees F oral Pulse rate:   76 / minute Pulse rhythm:   regular BP sitting:   110 / 70  (left arm) Cuff size:   regular  Vitals Entered By: Benny Lennert CMA Duncan Dull) (May 07, 2009 11:23 AM)  History of Present Illness: Chief complaint follow up copd/htn  COPD, moderate..improved SOB continues. Able to walk  further.  Cough resolving.Marland Kitchenonly rare..from dust in air.    Some pain in right chest when moving right arm x few months..no shoulder pain. Started after doing arm stretches.  Some pain when bending over but only when stretching arm forward as weel No swelling in ankles. Tender to palpation. No breast masses.   Past CABG  4 years ago.  Hypertension History:      She complains of chest pain, but denies palpitations, peripheral edema, and syncope.  B P well controlled. [name]        Positive major cardiovascular risk factors include female age 39 years old or older, hyperlipidemia, and hypertension.  Negative major cardiovascular risk factors include non-tobacco-user status.        Positive history for target organ damage include ASHD (either angina/prior MI/prior CABG) and peripheral vascular disease.     Problems Prior to Update: 1)  Chest Wall Pain, Anterior  (ICD-786.52) 2)  Epigastric Pain  (ICD-789.06) 3)  Osteoporosis  (ICD-733.00) 4)  Neck Pain, Acute  (ICD-723.1) 5)  Tinnitus  (ICD-388.30) 6)  Headache  (ICD-784.0) 7)  Common Migraine  (ICD-346.10) 8)  Neoplasm, Skin, Uncertain Behavior  (ICD-238.2) 9)  Abdominal Aortic Aneurysm  (ICD-441.4) 10)  Chest Pain, Atypical  (ICD-786.59) 11)  Screening For Mlig Neop, Breast, Nos  (ICD-V76.10) 12)  Hypertension  (ICD-401.9) 13)  Hyperlipidemia  (ICD-272.4) 14)  Hypothyroidism  (ICD-244.9) 15)  Osteoarthritis  (ICD-715.90) 16)  Chronic Obstructive  Pulmonary Disease, Moderate  (ICD-496) 17)  Cad  (ICD-414.00)  Current Medications (verified): 1)  Benazepril Hcl 10 Mg  Tabs (Benazepril Hcl) .... Take 1 Tablet By Mouth Once A Day 2)  Tenormin 25 Mg  Tabs (Atenolol) .... Take 1 Tablet By Mouth Once A Day 3)  Simvastatin 80 Mg  Tabs (Simvastatin) .... Take 1 Tablet By Mouth Once A Day 4)  Aspirin 81 Mg  Tabs (Aspirin) .... Take 1 Tablet By Mouth Once A Day 5)  Levothyroxine Sodium 25 Mcg Tabs (Levothyroxine Sodium) .[name].. 1 Tab By Mouth Every  Day 6)  Calcium Carbonate-Vitamin D 600-400 Mg-Unit  Tabs (Calcium Carbonate-Vitamin D) .[name].. 1 Tab By Mouth Every Other Day (Sometimes). 7)  Spiriva Handihaler 18 Mcg Caps (Tiotropium Bromide Monohydrate) .[name].. 1 Inh Daily  Allergies: 1)  ! Prednisone 2)  ! * Iv Dye  Past History:  Past medical, surgical, family and social histories (including risk factors) reviewed, and no changes noted (except as noted below).  Past Medical History: Reviewed history from 02/11/2009 and no changes required. EPIGASTRIC PAIN (ICD-789.06) OSTEOPOROSIS (ICD-733.00) NECK PAIN, ACUTE (ICD-723.1) TINNITUS (ICD-388.30) HEADACHE (ICD-784.0) COMMON MIGRAINE (ICD-346.10) NEOPLASM, SKIN, UNCERTAIN BEHAVIOR (ICD-238.2) ABDOMINAL AORTIC ANEURYSM (ICD-441.4) CHEST PAIN, ATYPICAL (ICD-786.59) SCREENING FOR MLIG NEOP, BREAST, NOS (ICD-V76.10) HYPERTENSION (ICD-401.9) HYPERLIPIDEMIA (ICD-272.4) HYPOTHYROIDISM (ICD-244.9) OSTEOARTHRITIS (ICD-715.90) CHRONIC OBSTRUCTIVE PULMONARY DISEASE, MODERATE (ICD-496) CAD (ICD-414.00)  Past Surgical History: Reviewed history  from 04/15/2007 and no changes required. 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 father alzheimer's, parkinson's mother: polio, enlarged heart, MI brother: healthy sister: HTN, hypothyroid sister:  DM sister: HTN, breast cancer PGF: aneurysm no cancer  Social History: Reviewed history from 08/15/2007 and no changes required. Occupation: housewife  Married Former Smoker, 40 pack years Alcohol use-no Drug use-no Regular exercise-no Regular exercise-yes, walk  Review of Systems CV:  Complains of chest pain or discomfort; denies swelling of feet. Resp:  Denies sputum productive and wheezing. GI:  Denies abdominal pain, bloody stools, constipation, and diarrhea. GU:  Denies dysuria.  Physical Exam  General:  Well-developed,well-nourished,in no acute distress; alert,appropriate and cooperative throughout examination Mouth:  Oral mucosa and oropharynx without lesions or exudates.  Teeth in moderate repair. Neck:  no carotid bruit or thyromegaly,  no cervical or supraclavicular lymphadenopathy  Chest Wall:  ttp over right upper chest wall, no mass No supraclavicular lymphadenopathy Lungs:  Normal respiratory effort, chest expands symmetrically, somewhat tympanous lung sounds. Lungs are clear to auscultation, no crackles or wheezes. Heart:  Normal rate and regular rhythm. S1 and S2 normal without gallop, murmur, click, rub or other extra sounds. Abdomen:  ttp in epigastrum Msk:  full ROm of B shoulders, no pain in shoulders Pulses:  R and L posterior tibial pulses are full and equal bilaterally and B radial pulses Extremities:  no edema in upper or lower ext Neurologic:  No cranial nerve deficits noted. Station and gait are normal. Sensory, motor and coordinative functions appear intact.   Impression & Recommendations:  Problem # 1:  CHEST WALL PAIN, ANTERIOR (ICD-786.52) High risk for CAD...but this is not typical of CAD pain.Marland KitchenAppt with cards in 08/2009. Will treat as MSKstrain.Marland Kitchenuse NSAIDs, heat and gentle stretching..call if not imrpoving.  Her updated medication list for this problem includes:    Aspirin 81 Mg Tabs (Aspirin) .[name]... Take 1 tablet by mouth once a day  Problem  # 2:  CHRONIC OBSTRUCTIVE PULMONARY DISEASE, MODERATE (ICD-496)  Improving on Spiriva..continue to follow. Her updated medication list for this problem includes:    Spiriva Handihaler 18 Mcg Caps (Tiotropium bromide monohydrate) .[name]... 1 inh daily  Vaccines Reviewed: Pneumovax: Pneumovax (Medicare) (05/27/2008)   Flu Vax: Fluvax 3+ (01/15/2009)  Problem # 3:  HYPERTENSION (ICD-401.9) Well controlled. Continue current medication.  Her updated medication list for this problem includes:    Benazepril Hcl 10 Mg Tabs (Benazepril hcl) .[name]... Take 1 tablet by mouth once a day    Tenormin 25 Mg Tabs (Atenolol) .[name]... Take 1 tablet by mouth once a day  BP today: 110/70 Prior BP: 120/76 (01/15/2009)  Prior 10 Yr Risk Heart Disease: N/A (02/17/2008)  Labs Reviewed: K+: 4.8 (11/30/2008) Creat: : 0.8 (11/30/2008)   Chol: 131 (11/30/2008)   HDL: 53.70 (11/30/2008)   LDL: 61 (11/30/2008)   TG: 84.0 (11/30/2008)  Complete Medication List: 1)  Benazepril Hcl 10 Mg Tabs (Benazepril hcl) .... Take 1 tablet by mouth once a day 2)  Tenormin 25 Mg Tabs (Atenolol) .... Take 1 tablet by mouth once a day 3)  Simvastatin 80 Mg Tabs (Simvastatin) .... Take 1 tablet by mouth once a day 4)  Aspirin 81 Mg Tabs (Aspirin) .... Take 1 tablet by mouth once a day 5)  Levothyroxine Sodium 25 Mcg Tabs (Levothyroxine sodium) .[name].. 1 tab by mouth every  day 6)  Calcium Carbonate-vitamin D 600-400 Mg-unit Tabs (Calcium carbonate-vitamin d) .[name].. 1 tab by mouth every other day (sometimes). 7)  Spiriva Handihaler 18 Mcg Caps (Tiotropium bromide monohydrate) .[name].. 1 inh daily  Hypertension Assessment/Plan:      The patient's hypertensive risk group is category C: Target organ damage and/or diabetes.  Today's blood pressure is 110/70.  Her blood pressure goal is < 140/90.  Patient Instructions: 1)  SCshedule CPX in 06/2009. 2)  Fasting lipids, CMET Dx 272.0, TSH Dx 244.9 3)  Heat and gentle stretching. 4)  Call if not  improving.  Prescriptions: SPIRIVA HANDIHALER 18 MCG CAPS (TIOTROPIUM BROMIDE MONOHYDRATE) 1 inh daily  #1 x 11   Entered and Authorized by:   Kerby Nora MD   Signed by:   Kerby Nora MD on 05/07/2009   Method used:   Electronically to        [facility] [address] (retail)       [address]       [place], [place]  [address]       Ph: [phone]       Fax: [phone]   RxID:   [id number] LEVOTHYROXINE SODIUM 25 MCG TABS (LEVOTHYROXINE SODIUM) 1 tab by mouth every  day  #90 x 3   Entered and Authorized by:   Kerby Nora MD   Signed by:   Kerby Nora MD on 05/07/2009   Method used:   Electronically to        [facility] [address] (retail)       [address], [address]       [place], [place]  [address]       Ph: [phone]       Fax: [phone]   RxID:   [id number] SIMVASTATIN 80 MG  TABS (SIMVASTATIN) Take 1 tablet by mouth once a day  #90 x 3   Entered and Authorized by:   Kerby Nora MD   Signed by:   Kerby Nora MD on 05/07/2009   Method used:   Electronically to        [facility] [address] (retail)       [address], [address]       [place], [place]  [address]       Ph: [phone]       Fax: [phone]   RxID:   [id number] TENORMIN 25 MG  TABS (ATENOLOL) Take 1 tablet by mouth once a day  #90 x 3   Entered and Authorized by:   Kerby Nora MD   Signed by:   Kerby Nora MD on 05/07/2009   Method used:   Electronically to        [facility] [address] ([facility]ail)       [address]       [place], [place]  [address]       Ph: [phone]       Fax: [phone]   RxID:   [id number] BENAZEPRIL HCL 10 MG  TABS (BENAZEPRIL HCL) Take 1 tablet by mouth once a day  #90 x 3   Entered and Authorized by:   Kerby Nora MD   Signed by:   Kerby Nora MD on 05/07/2009   Method used:   Electronically to  [facility] [address] ([facility]ail)       [address], [address]       [place], [place]  [address]       Ph: [phone]       Fax: [phone]   RxID:   [id number]   Current Allergies (reviewed today): ! PREDNISONE ! * IV DYE

## 2010-05-18 NOTE — Miscellaneous (Signed)
Summary: Orders Update  Clinical Lists Changes  Orders: Added new Test order of Arterial Duplex Lower Extremity (Arterial Duplex Low) - Signed 

## 2010-05-19 NOTE — Assessment & Plan Note (Signed)
Summary: pain in back and ribs/alc   Vital Signs:  Patient profile:   70 year old female Weight:      137.25 pounds Temp:     98.5 degrees F oral Pulse rate:   70 / minute Pulse rhythm:   regular BP sitting:   140 / 80  (left arm) Cuff size:   regular  Vitals Entered By: Selena Batten Dance CMA Duncan Dull) (April 07, 2010 11:22 AM) CC: Thoracic pain x2 days Comments Increases with movement, deep breathing and coughing   History of Present Illness: CC: back pain  69yo with CAD/ inferior MI and 4v CABG (2007), HTN, HLD, osteoporosis, AAA presents with 2d h/o worsening back pain thoracic, ribs.  Has actually been going on for 1 mo but recently worsened.  Worse with movement esp reaching back.  Not as bad with deep breaths.  Hurting R side of back, travels to RUQ.  Has tried hot/icy and tylenol which didnt' help.  Never had similar episode.  + nausea in morning after eating.  + SOB but not worsening, spiriva helps.  pain not exertional.  no h/o reflux.  quit smoking 2007.  No fevers/chills, diarrhea.  No chest pain  h/o AAA, no abd pain.  s/p ultrasound last done 09/2009 per patient.  last MI - no chest pain.  presented as syncope/jaw burning.  wants to get checekd out prior to trip to [facility] early January.  Current Medications (verified): 1)  Benazepril Hcl 10 Mg  Tabs (Benazepril Hcl) .... Take 1 Tablet By Mouth Once A Day 2)  Tenormin 25 Mg  Tabs (Atenolol) .... Take 1 Tablet By Mouth Once A Day 3)  Simvastatin 80 Mg  Tabs (Simvastatin) .... Take 1 Tablet By Mouth Once A Day 4)  Aspirin 81 Mg  Tabs (Aspirin) .... Take 1 Tablet By Mouth Once A Day 5)  Levothyroxine Sodium 25 Mcg Tabs (Levothyroxine Sodium) .[name].. 1 Tab By Mouth Every  Day 6)  Spiriva Handihaler 18 Mcg Caps (Tiotropium Bromide Monohydrate) .[name].. 1 Inh Daily 7)  Transderm-Scop 1.5 Mg Pt72 (Scopolamine Base) .... Apply Behind Ear, Change Q 72 Hours  Allergies: 1)  ! Prednisone 2)  ! * Iv Dye  Past History:  Past Medical  History: Last updated: 02/11/2009 EPIGASTRIC PAIN (ICD-789.06) OSTEOPOROSIS (ICD-733.00) NECK PAIN, ACUTE (ICD-723.1) TINNITUS (ICD-388.30) HEADACHE (ICD-784.0) COMMON MIGRAINE (ICD-346.10) NEOPLASM, SKIN, UNCERTAIN BEHAVIOR (ICD-238.2) ABDOMINAL AORTIC ANEURYSM (ICD-441.4) CHEST PAIN, ATYPICAL (ICD-786.59) SCREENING FOR MLIG NEOP, BREAST, NOS (ICD-V76.10) HYPERTENSION (ICD-401.9) HYPERLIPIDEMIA (ICD-272.4) HYPOTHYROIDISM (ICD-244.9) OSTEOARTHRITIS (ICD-715.90) CHRONIC OBSTRUCTIVE PULMONARY DISEASE, MODERATE (ICD-496) CAD (ICD-414.00)  Past Surgical History: Last updated: 04/15/2007 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  Teeth in good repair. Neck:  no carotid bruit or thyromegaly no cervical or supraclavicular lymphadenopathy  Lungs:  Normal respiratory effort, chest expands symmetrically. Lungs are clear to auscultation, no crackles or wheezes. Heart:  Normal rate and regular rhythm. S1 and S2 normal without gallop, murmur, click, rub or other extra sounds. Abdomen:  Bowel sounds positive,abdomen soft and non-tender without masses, organomegaly or hernias noted.  no RUQ pain, + abd  bruit Msk:  no midline spine tenderness.  + ttp R thoracic paraspinous mm and tight medial to scapula.  palpation reproduces sxs pt has been having.  No pain with palpation of R lateral ribs or abd.   Pulses:  2+ rad pulses Extremities:  no edema, B varicosities, mild Skin:  Intact without suspicious lesions  or rashes   Impression & Recommendations:  Problem # 1:  BACK PAIN, THORACIC REGION, RIGHT (ICD-724.1) very positional, reproducible.  likely MSK.  doubt thoracic aorta issue, doubt cardiac or pulmonary.  Treat with NSAIDs, flexeril and rest/ice.  If not beter with these measures, advised to return next week for further eval (?imaging).  if significantly worse in meantime, to ER.  checked EKG given cardiac history - sinus brady at 54 bpm, no acute ST/T changes, no changes compared to 06/2009.  Her updated medication list for this problem includes:    Aspirin 81 Mg Tabs (Aspirin) .[name]... Take 1 tablet by mouth once a day    Flexeril 5 Mg Tabs (Cyclobenzaprine hcl) .[name]... Take one twice daily for 5 days then as needed for muscle spasm  Orders: EKG w/ Interpretation (93000)  Problem # 2:  ABDOMINAL AORTIC ANEURYSM (ICD-441.4) h/o aneurysm.  last I can see checked abd CT 2010 and measured 4.6cm.  followed by Dr. Durwin Nora.  no current abd pain.  Complete Medication List: 1)  Benazepril Hcl 10 Mg Tabs (Benazepril hcl) .... Take 1 tablet by mouth once a day 2)  Tenormin 25 Mg Tabs (Atenolol) .... Take 1 tablet by mouth once a day 3)  Simvastatin 80 Mg Tabs (Simvastatin) .... Take 1 tablet by mouth once a day 4)  Aspirin 81 Mg Tabs (Aspirin) .... Take 1 tablet by mouth once a day 5)  Levothyroxine Sodium 25 Mcg Tabs (Levothyroxine sodium) .[name].. 1 tab by mouth every  day 6)  Spiriva Handihaler 18 Mcg Caps (Tiotropium bromide monohydrate) .[name].. 1 inh daily 7)  Transderm-scop 1.5 Mg Pt72 (Scopolamine base) .... Apply behind ear, change q 72 hours 8)  Flexeril 5 Mg Tabs (Cyclobenzaprine hcl) .... Take one twice daily for 5 days then as needed for muscle spasm  Patient Instructions: 1)  this sounds very muscular.   2)  Treat with ice, flexeril as muscle relaxant (caution can make you sleepy) and advil 400mg  three times a day for next 3 days.   3)  If not better, please return early next week for further  evaluation with myself or Dr. Ermalene Searing. 4)  If worsening, and we're closed, you may need to go to ER.   5)  schedule follow up with cardiology, i think you are due. Prescriptions: FLEXERIL 5 MG TABS (CYCLOBENZAPRINE HCL) take one twice daily for 5 days then as needed for muscle spasm  #20 x 0   Entered and Authorized by:   Eustaquio Boyden  MD   Signed by:   Eustaquio Boyden  MD on 04/07/2010   Method used:   Electronically to        [facility] [address])       [address]       [place], [place]  [address]       Ph: [phone]       Fax: [phone]   RxID:   [id number]    Orders Added: 1)  Est. Patient Level IV [84132] 2)  EKG w/ Interpretation [93000]    Current Allergies (  reviewed today): ! PREDNISONE ! * IV DYE

## 2010-05-19 NOTE — Progress Notes (Signed)
Summary: wants patches for sea sickness  Phone Note Call from Patient   Caller: Patient Call For: Kerby Nora MD Summary of Call: Pt is asking for patches for sea sickness.  She wil be gone for 5 days.  [facility]. Initial call taken by: Lowella Petties CMA, AAMA,  March 21, 2010 11:51 AM  Follow-up for Phone Call        done  tell her not to touch them and then her eyes, or they will dilate for about a week.   take at least a week before going onto the sea. Hannah Beat MD  March 21, 2010 5:26 PM   Additional Follow-up for Phone Call Additional follow up Details #1::        Patient advised rx sent to pharmacy and instruction with medication Additional Follow-up by: Benny Lennert CMA Duncan Dull),  March 21, 2010 5:27 PM    New/Updated Medications: TRANSDERM-SCOP 1.5 MG PT72 (SCOPOLAMINE BASE) Apply behind ear, change q 72 hours Prescriptions: TRANSDERM-SCOP 1.5 MG PT72 (SCOPOLAMINE BASE) Apply behind ear, change q 72 hours  #1 box x 0   Entered and Authorized by:   Hannah Beat MD   Signed by:   Hannah Beat MD on 03/21/2010   Method used:   [facility]ronically to        [facility] [address]       [address]       [place], [place]  [address]       Ph: [phone]       Fax: [phone]   RxID:   [id number]   Appended Document: wants patches for sea sickness Patient came in for OV today and said [facility] Trans derm patch. [facility] on [facility]-21-10. [facility] for patient.

## 2010-05-23 ENCOUNTER — Telehealth: Payer: Self-pay | Admitting: Family Medicine

## 2010-05-26 ENCOUNTER — Encounter: Payer: Self-pay | Admitting: Family Medicine

## 2010-06-02 NOTE — Progress Notes (Signed)
Summary: spiriva handihaler is too costly  Phone Note Call from Patient Call back at Home Phone [phone]   Caller: Patient Call For: Kerby Nora MD Summary of Call: Patient's spiriva handihaler has gone to a higher tier. It will now cost her over $100 when it used to only cost her $45. Patient would like to try something else that is less expensive. Uses [facility] [address].  Initial call taken by: Melody Comas,  May 23, 2010 4:33 PM  Follow-up for Phone Call        No generic equivalent to spiriva.  She can call her insurance to see if atrovent or duonebs is cheaper for her... but she would have to  take every 6 hours.  Also have her check if advair or symbicort is covered.. these are different but may help her in a different way. All above meds are brand.  Follow-up by: Kerby Nora MD,  May 24, 2010 8:17 AM  Additional Follow-up for Phone Call Additional follow up Details #1::        Patient will call back after she speaks to pharmacy.Consuello Masse CMA   Additional Follow-up by: Benny Lennert CMA Duncan Dull),  May 24, 2010 4:25 PM

## 2010-07-04 ENCOUNTER — Ambulatory Visit: Payer: Self-pay | Admitting: Gastroenterology

## 2010-07-06 ENCOUNTER — Other Ambulatory Visit (INDEPENDENT_AMBULATORY_CARE_PROVIDER_SITE_OTHER): Payer: PRIVATE HEALTH INSURANCE | Admitting: Family Medicine

## 2010-07-06 DIAGNOSIS — E039 Hypothyroidism, unspecified: Secondary | ICD-10-CM

## 2010-07-06 DIAGNOSIS — I10 Essential (primary) hypertension: Secondary | ICD-10-CM

## 2010-07-06 LAB — CBC WITH DIFFERENTIAL/PLATELET
Basophils Relative: 0.6 % (ref 0.0–3.0)
Eosinophils Relative: 3.7 % (ref 0.0–5.0)
Hemoglobin: 12.2 g/dL (ref 12.0–15.0)
Lymphocytes Relative: 27.3 % (ref 12.0–46.0)
Monocytes Relative: 8.2 % (ref 3.0–12.0)
Neutrophils Relative %: 60.2 % (ref 43.0–77.0)
RBC: 4.17 Mil/uL (ref 3.87–5.11)
WBC: 8 10*3/uL (ref 4.5–10.5)

## 2010-07-06 LAB — LIPID PANEL
HDL: 49 mg/dL (ref >39.00)
Total CHOL/HDL Ratio: 3

## 2010-07-06 LAB — HEPATIC FUNCTION PANEL
ALT: 14 U/L (ref 0–35)
AST: 17 U/L (ref 0–37)
Alkaline Phosphatase: 73 U/L (ref 39–117)
Bilirubin, Direct: 0.1 mg/dL (ref 0.0–0.3)
Total Protein: 6.8 g/dL (ref 6.0–8.3)

## 2010-07-06 LAB — BASIC METABOLIC PANEL
Calcium: 9.1 mg/dL (ref 8.4–10.5)
Creatinine, Ser: 0.8 mg/dL (ref 0.4–1.2)
Sodium: 137 mEq/L (ref 135–145)

## 2010-07-06 LAB — TSH: TSH: 0.39 u[IU]/mL (ref 0.35–5.50)

## 2010-07-13 ENCOUNTER — Ambulatory Visit: Payer: Self-pay | Admitting: Vascular Surgery

## 2010-07-19 ENCOUNTER — Ambulatory Visit (INDEPENDENT_AMBULATORY_CARE_PROVIDER_SITE_OTHER): Payer: Medicare Other | Admitting: Family Medicine

## 2010-07-19 ENCOUNTER — Encounter: Payer: Self-pay | Admitting: Family Medicine

## 2010-07-19 DIAGNOSIS — I714 Abdominal aortic aneurysm, without rupture, unspecified: Secondary | ICD-10-CM

## 2010-07-19 DIAGNOSIS — I10 Essential (primary) hypertension: Secondary | ICD-10-CM

## 2010-07-19 DIAGNOSIS — E785 Hyperlipidemia, unspecified: Secondary | ICD-10-CM

## 2010-07-19 DIAGNOSIS — L821 Other seborrheic keratosis: Secondary | ICD-10-CM

## 2010-07-19 DIAGNOSIS — J4489 Other specified chronic obstructive pulmonary disease: Secondary | ICD-10-CM

## 2010-07-19 DIAGNOSIS — J449 Chronic obstructive pulmonary disease, unspecified: Secondary | ICD-10-CM

## 2010-07-19 DIAGNOSIS — M810 Age-related osteoporosis without current pathological fracture: Secondary | ICD-10-CM

## 2010-07-19 DIAGNOSIS — Z0000 Encounter for general adult medical examination without abnormal findings: Secondary | ICD-10-CM

## 2010-07-19 DIAGNOSIS — E039 Hypothyroidism, unspecified: Secondary | ICD-10-CM

## 2010-07-19 LAB — HM PAP SMEAR

## 2010-07-19 NOTE — Assessment & Plan Note (Signed)
Pt reassured.

## 2010-07-19 NOTE — Assessment & Plan Note (Signed)
Improved on spiriva.

## 2010-07-19 NOTE — Assessment & Plan Note (Signed)
Well controlled. Continue current medication. LDL goal <70 given CAD.

## 2010-07-19 NOTE — Assessment & Plan Note (Signed)
Dr. Dicie Beam follows yearly.Marland Kitchenappointment scheduled 07/2010.

## 2010-07-19 NOTE — Assessment & Plan Note (Signed)
Well controlled. Continue current medication.  

## 2010-07-19 NOTE — Assessment & Plan Note (Signed)
Refuses DEXA and bisphosphonate treatment.

## 2010-07-19 NOTE — Progress Notes (Signed)
Subjective:    Patient ID: Amy Stone, female    DOB: [birth year], 70 y.o.   MRN: [id number]  HPI 69yo with CAD/ inferior MI and 4v CABG (2007), HTN, HLD, osteoporosis, AAA presents for annual medicare wellness visit.  I have personally reviewed the Medicare Annual Wellness questionnaire and have noted 1. The patient's medical and social history 2. Their use of alcohol, tobacco or illicit drugs 3. Their current medications and supplements 4. The patient's functional ability including ADL's, fall risks, home safety risks and hearing or visual             impairment. 5. Diet and physical activities 6. Evidence for depression or mood disorders The patients weight, height, BMI and visual acuity have been recorded in the chart I have made referrals, counseling and provided education to the patient based review of the above and I have provided the pt with a written personalized care plan for preventive services.  HTN, well controlled on atenolol, benazapril   CAD,stable Cardiologist is Dr. Dicie Beam.. Seeing yearly.  AAA.[name] Last abdominal US: 1 year ago.[name] Has appt for repeat scheduled 4/18.   High cholesterol: GOAL <70 given past CAD... At goal on simvastatin 80 mg daily.  COPD, well controlled on Spiriva.. She reports breathing much better prior to being on Spiriva.   Hypothyroid, well controlled:  Lab Results  Component Value Date   TSH 0.39 07/06/2010   New skin rash on scalp in 3 spots... Present x several months. In last month...gotten larger. Area is itchy, no bleeding. occ mild soreness.    Review of Systems  Constitutional: Negative for fever, fatigue and unexpected weight change.  HENT: Negative for ear pain, congestion, sore throat, sneezing, trouble swallowing and sinus pressure.        [Has hearing aid new now in left ear.. Plans to get in right soon.  Eyes: Negative for pain and itching.  Respiratory: Negative for cough, shortness of breath and wheezing.     Cardiovascular: Negative for chest pain, palpitations and leg swelling.  Gastrointestinal: Negative for nausea, abdominal pain, diarrhea, constipation and blood in stool.  Genitourinary: Negative for dysuria, hematuria, vaginal discharge and difficulty urinating.  Skin: Negative for rash.  Neurological: Negative for syncope, weakness, light-headedness, numbness and headaches.  Psychiatric/Behavioral: Negative for confusion and dysphoric mood. The patient is not nervous/anxious.        Objective:   Physical Exam  Constitutional: Vital signs are normal. She appears well-developed and well-nourished. She is cooperative.  Non-toxic appearance. She does not appear ill. No distress.  HENT:  Head: Normocephalic.  Right Ear: Hearing, tympanic membrane, external ear and ear canal normal.  Left Ear: Hearing, tympanic membrane, external ear and ear canal normal.  Nose: Nose normal.  Eyes: Conjunctivae, EOM and lids are normal. Pupils are equal, round, and reactive to light. No foreign bodies found.  Neck: Trachea normal and normal range of motion. Neck supple. Carotid bruit is not present. No mass and no thyromegaly present.  Cardiovascular: Normal rate, regular rhythm, S1 normal, S2 normal, normal heart sounds and intact distal pulses.  Exam reveals no gallop.   No murmur heard. Pulmonary/Chest: Effort normal and breath sounds normal. No respiratory distress. She has no wheezes. She has no rhonchi. She has no rales.  Abdominal: Soft. Normal appearance and bowel sounds are normal. She exhibits no distension, no fluid wave, no abdominal bruit and no mass. There is no hepatosplenomegaly. There is no tenderness. There is no rebound, no guarding and  no CVA tenderness. No hernia.  Genitourinary: No breast swelling, tenderness, discharge or bleeding. Pelvic exam was performed with patient prone.  Lymphadenopathy:    She has no cervical adenopathy.    She has no axillary adenopathy.  Neurological: She is  alert. She has normal strength. No cranial nerve deficit or sensory deficit.  Skin: Skin is warm, dry and intact. Rash noted. Rash is macular.       Multiple SKs on torso Three non irritated SKs on scalp  Psychiatric: Her speech is normal and behavior is normal. Judgment normal. Her mood appears not anxious. Cognition and memory are normal. She does not exhibit a depressed mood.          Assessment & Plan:

## 2010-07-19 NOTE — Patient Instructions (Addendum)
Consider setting up health care power of attorney and living will. Continue healthy eating and regular exercise.

## 2010-07-21 ENCOUNTER — Encounter: Payer: Self-pay | Admitting: Family Medicine

## 2010-07-25 ENCOUNTER — Other Ambulatory Visit: Payer: Self-pay | Admitting: Family Medicine

## 2010-07-26 ENCOUNTER — Other Ambulatory Visit: Payer: Self-pay | Admitting: Family Medicine

## 2010-07-26 NOTE — Telephone Encounter (Signed)
error 

## 2010-08-03 ENCOUNTER — Ambulatory Visit (INDEPENDENT_AMBULATORY_CARE_PROVIDER_SITE_OTHER): Payer: Medicare Other | Admitting: Vascular Surgery

## 2010-08-03 DIAGNOSIS — I714 Abdominal aortic aneurysm, without rupture, unspecified: Secondary | ICD-10-CM

## 2010-08-03 DIAGNOSIS — I739 Peripheral vascular disease, unspecified: Secondary | ICD-10-CM

## 2010-08-04 NOTE — Procedures (Unsigned)
DUPLEX ULTRASOUND OF ABDOMINAL AORTA  INDICATION:  Abdominal aortic aneurysm.  HISTORY: Diabetes:  No Cardiac:  CABG Hypertension:  Yes Smoking:  Previous Family History:  No Previous Surgery:  No  DUPLEX EXAM:         AP (cm)                   TRANSVERSE (cm) Proximal             2.32 cm                   2.36 cm Mid                  2.08cm                    2.11 cm Distal               4.36 cm                   4.38 cm Right Iliac          0.82 cm                   0.82 cm Left Iliac           0.87 cm                   0.91 cm  PREVIOUS:  Date: 10/14/2009  AP:  3.9  TRANSVERSE:  4.3  IMPRESSION: 1. Distal abdominal aortic aneurysm measuring 4.36 cm x 4.38 cm with     intramural thrombus present. 2. Peak systolic velocity of 304 cm/sec. suggesting a 50% to 75%     stenosis involving the right common iliac artery. 3. Peak systolic velocity of 434 cm/sec. suggesting a greater than 75%     stenosis involving the left common iliac artery. 4. Essentially unchanged since previous study on 10/14/2009.  ___________________________________________ Di Kindle. Edilia Bo, M.D.  SH/MEDQ  D:  08/03/2010  T:  08/03/2010  Job:  [id number]

## 2010-08-04 NOTE — Assessment & Plan Note (Signed)
OFFICE VISIT  Amy Stone, Amy Stone DOB:  08/05/1940                                       08/03/2010 CHART#:[id number]  I saw the patient in the office today for continued followup of her abdominal aortic aneurysm.  I last saw her in June 2011 at which time the aneurysm measured 4.3 cm in maximum diameter.  She comes in for her 62-month followup visit.  Since I saw her last, she has had no significant abdominal or back pain.  She does have stable claudication in both thighs and calves.  This pain is brought on by ambulation and relieved with rest.  It occurs approximately a third of a mile.  Her symptoms have been stable over the last 9 months.  Her symptoms are slightly more significant on the left side.  She has had no rest pain or no history of nonhealing ulcers.  PAST MEDICAL HISTORY:  Significant for hypertension, hypercholesterolemia, and coronary artery disease.  She had a CABG in 2008.  SOCIAL HISTORY:  She is married.  She quit tobacco 5 years ago.  REVIEW OF SYSTEMS:  CARDIOVASCULAR:  She does admit to dyspnea on exertion.  She has had no chest pain, chest pressure, palpitations, or arrhythmias.  She has had no history of stroke or TIA. PULMONARY:  She has had bronchitis in the past. MUSCULOSKELETAL:  She does have arthritis.  PHYSICAL EXAMINATION:  This is a pleasant 70 year old woman who appears her stated age.  Blood pressure is 134/50, heart rate is 72, respiratory rate is 18.  HEENT is unremarkable.  Lungs are clear bilaterally to auscultation without rales, rhonchi, or wheezing.  On cardiovascular exam, I do not detect any carotid bruits.  She has a regular rate and rhythm.  She has palpable femoral pulses bilaterally.  Both feet are warm and well perfused without ischemic ulcers.  Abdomen is soft and nontender with normal pitched bowel sounds.  Her aneurysm is palpable and nontender.  Neurologic exam:  She has no focal weakness  or paresthesias.  I have independently interpreted her duplex of her aneurysm which shows the maximum diameter is 4.38 cm and, thus, has not changed significantly over the last 9 months.  She does have evidence of bilateral common iliac artery stenosis.  By duplex criteria, she has a 50% to 75% stenosis in the right common iliac artery and a greater than 75% stenosis in the left common iliac artery.  These velocities are not changed significantly compared to June.  As her symptoms have remained stable, we will simply continue to follow her iliac disease for now.  With respect to her aneurysm, she knows we would consider elective repair if the aneurysm reached 5.5 cm in maximum diameter.  I have ordered a followup CT angio of the abdomen and pelvis in 9 months which will also allow [place] to evaluate her iliac disease. Consideration could be given to addressing the iliac disease with angioplasty if it did not involve the aneurysm.  However, currently, she feels her symptoms are quite stable.  I will see her back in 9 months. She knows to call sooner if she has problems.    Di Kindle. Edilia Bo, M.D. Electronically Signed  CSD/MEDQ  D:  08/03/2010  T:  08/04/2010  Job:  [id number] [id number]

## 2010-08-30 NOTE — Assessment & Plan Note (Signed)
OFFICE VISIT   MAXINE, FREDMAN  DOB:  1940-08-09                                       10/14/2009  CHART#:[id number]   I saw the patient in the office today for continued follow-up of her  infrarenal abdominal aortic aneurysm.  She also has bilateral lower  extremity claudication.  I last saw her in December 2010.  At that time  she had a CT scan which showed no change in the size of her aneurysm.  By CT scan the aneurysm was 4.6 cm.  However compared to a previous CT  scan a year ago it was 4.7 at that time, she said there had been no  change.  She comes in for a routine follow-up ultrasound study.  Since I  saw her last she has had no abdominal or back pain.  She continues to  have stable claudication in both calves and thighs associated with  ambulation and relieved with rest.  Symptoms are more significant on the  right side.  Symptoms are aggravated by ambulating and relieved with  rest.  There are no associated symptoms.  She has had no rest pain and  no history of nonhealing wounds.   PAST MEDICAL HISTORY:  Significant for hypertension and  hypercholesterolemia.  She had a myocardial infarction 4 years ago and  quit tobacco at that time.   SOCIAL HISTORY:  She is married.  She has three children.  She quit  tobacco 4 years ago.   REVIEW OF SYSTEMS:  CARDIOVASCULAR:  She has had no recent chest pain,  chest pressure, palpitations or arrhythmias.  She does admit to dyspnea  on exertion.  She has had no history of stroke or TIAs.  PULMONARY:  She has had occasional bronchitis in the past.   PHYSICAL EXAMINATION:  This is a pleasant 70 year old woman who appears  her stated age.  Blood pressure 102/70, heart rate is 65, saturation  97%, temperature is 98.  HEENT:  Unremarkable.  Lungs are clear  bilaterally to auscultation without rales, rhonchi or wheezing.  Cardiovascular examination; I do not detect any carotid bruits.  She has  a regular rate  and rhythm.  She has slightly diminished femoral pulses  and diminished but palpable dorsalis pedis pulses bilaterally.  Both  feet are warm and well-perfused without ischemic ulcers.  Abdomen:  Soft  and nontender.  Aneurysm is palpable and nontender.  She has normal  pitched bowel sounds.  Neurologic examination; she has no focal weakness  or paresthesias.   I have independently interpreted a duplex of her aneurysm which shows a  maximum diameter of 4.3 cm and has not changed compared to study in June  2010 when it was 4.3 cm by ultrasound.  Reviewing back in January 2009  she had an ultrasound which showed it was 4.3 cm.   Given that the aneurysm has been quite stable I have recommended we  stretch  her follow-up out to 9 months and I will see her back at that  time with a follow-up ultrasound which I have ordered.  She does have  some iliac disease bilaterally based on increased velocity seen on  duplex scan.  However, in the past we have discussed proceeding with  arteriography to further evaluate this and she feels her symptoms are  quite tolerable and did not  wish to pursue any further workup for this  unless her symptoms progressed, which I thought was quite reasonable.   I plan on seeing her back in 9 months.  She knows to call sooner if she  has problems.     Di Kindle. Edilia Bo, M.D.  Electronically Signed   CSD/MEDQ  D:  10/14/2009  T:  10/14/2009  Job:  [id number]

## 2010-08-30 NOTE — Procedures (Signed)
DUPLEX ULTRASOUND OF ABDOMINAL AORTA   INDICATION:   HISTORY:  Diabetes:  no  Cardiac:  CABG  Hypertension:  yes  Smoking:  Previous  Connective Tissue Disorder:  Family History:  no  Previous Surgery:  No   DUPLEX EXAM:         AP (cm)                   TRANSVERSE (cm)  Proximal             2.0 cm                    2.0 cm  Mid                  3.0 cm                    3.0 cm  Distal               3.9 cm                    4.3 cm  Right Iliac                                    1.02 cm  Left Iliac                                     1.09 cm   PREVIOUS:  Date:  AP:  4.1  TRANSVERSE:  4.3   IMPRESSION:  1. Abdominal aortic aneurysm with the largest diameter of 3.9 x 4.3 cm      in the distal aorta.  2. Increased velocities of 440 cm/ sec noted in the right common iliac      artery and 282 cm/sec noted in the left common iliac artery.  3. Stable from previous exam.   ___________________________________________  Di Kindle. Edilia Bo, M.D.   NT/MEDQ  D:  10/14/2009  T:  10/14/2009  Job:  [id number]

## 2010-08-30 NOTE — Procedures (Signed)
DUPLEX ULTRASOUND OF ABDOMINAL AORTA   INDICATION:  Follow-up abdominal aortic aneurysm.   HISTORY:  Diabetes:  No.  Cardiac:  CABG.  Hypertension:  Yes.  Smoking:  Quit about 3 years ago.  Connective Tissue Disorder:  Family History:  Previous Surgery:   DUPLEX EXAM:         AP (cm)                   TRANSVERSE (cm)  Proximal             2.37 cm                   2.41 cm  Mid                  3.46 cm                   3.20 cm  Distal               4.17 cm                   4.30 cm  Right Iliac          1.20 cm                   1.62 cm  Left Iliac           1.30 cm                   1.49 cm   PREVIOUS:  Date:  AP:  3.67  TRANSVERSE:  4.27   IMPRESSION:  Abdominal aortic aneurysm noted with the largest  measurement of (4.17 cm x 4.30 cm).   ___________________________________________  Di Kindle. Edilia Bo, M.D.   MG/MEDQ  D:  10/13/2008  T:  10/13/2008  Job:  [id number]

## 2010-08-30 NOTE — Consult Note (Signed)
VASCULAR SURGERY CONSULTATION   Amy Stone, Amy Stone  DOB:  May 10, 1940                                       03/17/2008  CHART#:[id number]   I saw the patient in the office today in consultation concerning a 4.6  cm infrarenal abdominal aortic aneurysm.  This is a pleasant 70 year old  woman who was referred by Dr. Ermalene Searing after her most recent ultrasound  showed that her aneurysm had enlarged to 4.6 cm.  The patient has had  some abdominal pain for the last 3 weeks which she states comes and  goes.  There are really no aggravating or alleviating factors.  Associated with this the pain is sharp and intermittent, is not  especially severe.  She has not had any postprandial abdominal pain.  She has not had significant back pain.  Of note, her aneurysm by  ultrasound was 4.3 cm approximately a year ago in January of 2009.   PAST MEDICAL HISTORY:  Her past medical history is significant for  hypercholesterolemia and hypertension.  In addition, she had a  myocardial infarction in 2007 and underwent coronary revascularization  at that time.  She is followed by [facility].  She denies any  history of diabetes, history of congestive heart failure or history of  COPD.   FAMILY HISTORY:  She had a brother who had a thoracic aneurysm repaired.  She is unaware of any history of premature cardiovascular disease.   SOCIAL HISTORY:  She is married.  She has three children.  She quit  tobacco in 2007.   REVIEW OF SYSTEMS AND MEDICATIONS:  Are documented on the medical  history form in her chart.   PHYSICAL EXAMINATION:  General:  This is a pleasant 70 year old woman  who appears her stated age.  Vital signs:  Her blood pressure is 134/73,  heart rate is 74.  Neck:  Is supple.  No cervical lymphadenopathy.  I do  not detect any carotid bruits.  Lungs:  Are clear bilaterally to  auscultation.  Cardiac:  She has a regular rate and rhythm.  Abdomen:  Soft and nontender.  Her  aneurysm is palpable and nontender.  She has  some lower midline incision from previous hysterectomy and tubal  ligation.  She has palpable femoral pulses and palpable dorsalis pedis  pulses bilaterally.  She has no significant lower extremity swelling.  Neurological:  Exam is nonfocal.   Her ultrasound shows a 4.6 cm infrarenal abdominal aortic aneurysm.   I have explained we generally would not consider elective repair of the  aneurysm unless it reached 5.5 cm in maximum diameter.  I have recommend  a followup ultrasound in 6 months and I will see her back at that time.  Given that she has had some abdominal pain, however, we will obtain a CT  scan of the abdomen to help sort this out although I have explained I do  not think her pain is from her aneurysm as this pain has been  intermittent and is fairly mild.  I will call her with those results  after her CT scan is done.  ([phone]).   Di Kindle. Edilia Bo, M.D.  Electronically Signed  CSD/MEDQ  D:  03/17/2008  T:  03/18/2008  Job:  [id number]   cc:   Kerby Nora, MD

## 2010-08-30 NOTE — Letter (Signed)
November 19, 2007    Karie Schwalbe, MD  [address] [place], [place] [address]   RE:  Amy, Stone  MRN:  [id number]  /  DOB:  [birth year]   Dear Dr. Alphonsus Stone:   I had the pleasure today of seeing Amy Stone in our Cardiology  Office in [facility].  As you recall, she is a pleasant 70-year-  old Caucasian female with a past medical history significant for  hyperlipidemia, hypothyroidism, and coronary artery disease status post  an acute inferior myocardial infarction in August of 2007.  At the time  of her myocardial infarction, Dr. Randa Stone performed an emergent  heart catheterization where he found an occluded right coronary artery  as well as other diffuse coronary disease.  The patient underwent  angioplasty with a placement of a bare-metal stent into the right  coronary artery.  Given the severity of her other coronary artery  disease, she proceeded to have coronary artery bypass grafting surgery  by Dr. Donata Stone.  She had an uncomplicated postoperative course and has  done well since that time.  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  In regards to her cardiovascular health, she  gives me no complaints.  She states that she has been active in her  garden and has experienced no chest discomfort or shortness of breath.  She denies any palpitations, dizziness, syncope, near syncope,  paroxysmal nocturnal dyspnea, orthopnea, or lower extremity edema.  She  has not felt any palpitations or dyspnea with extreme exertion.  She  climbs multiple stairs daily and has no problems with this.  She has  continued to take all of her medicines as prescribed before and are  listed below.  She has been holding her aspirin prior to her planned  surgical procedure.  She stopped smoking 2 years  ago at the time of her  myocardial infarction and has not resumed tobacco use.   CURRENT MEDICATIONS:  1. Atenolol 25 mg once daily.  2. Simvastatin 80 mg once daily.  3. Benazepril 10 mg once daily.  4. Synthroid 50 mcg once daily.  5. Estropipate 0.75 mg once daily  6. (aspirin 81 mg once daily, however, she has been holding this prior      to her planned surgical procedure).   PHYSICAL EXAMINATION:  GENERAL:  She is a well-appearing Caucasian  female, in no acute distress.  VITAL SIGNS:  Her heart rate today is 78 and regular.  Blood pressure is  120/80, respirations are 12 and unlabored.  Her weight is 141 pounds,  which represents a 37-pound weight increase over the last 22 months.  NECK:  No JVD.  No carotid bruits noted.  SKIN:  Warm, dry, and intact.  LUNGS:  Clear to auscultation bilaterally with no wheezes, rhonchi, or  crackles noted.  CARDIOVASCULAR:  Regular rate and rhythm with normal S1, normal S2.  No  murmurs, gallops, or rubs are noted.  ABDOMEN:  Soft and nontender.  Bowel sounds are present.  EXTREMITIES:  There is no evidence of lower extremity edema.  The  dorsalis pedis pulses are 1+  in both lower extremities.  Posterior  tibial pulses are 1+ in both lower extremities.  There are no  varicosities noted on her lower  extremities.   A 12-lead electrocardiogram today shows normal sinus rhythm and a  possible right atrial enlargement.  There are no ischemic changes noted.   IMPRESSION AND RECOMMENDATIONS:  I feel that Amy Stone is doing well  from a cardiovascular standpoint.  She exhibits no symptoms that are  suggestive of angina or worsening of her coronary artery disease.  I  have encouraged her to remain active.  I do not think that any  medication changes are needed at the current time.  I also do not  suggest any further cardiovascular workup prior to her planned surgical  procedure.  I will have her continue to take her statin medication, beta-   blocker, and ACE inhibitor.  Her aspirin should be resumed after her  surgical procedure is completed.  In regards to her hyperlipidemia, I  have asked her to obtain records of recent fasting lipid profile from  her primary care physician.  If this has not been checked in the last  year, then we should repeat that prior to her next visit in our office.  Of note, the patient did complain of occasional cramping in her lower  extremities.  At the time of her next visit in our office in 6 months,  we will perform noninvasive lower extremity arterial studies to assess  for peripheral arterial disease.  I will see her on return in our office  here in [place] in 6 months.   I appreciate the opportunity to assist in the care of this very pleasant  lady.   Sincerely,   Amy Carrow, MD    Sincerely,      Amy Carrow, MD  Electronically Signed    CM/MedQ  DD: 11/19/2007  DT: 11/20/2007  Job #: [id number]   CC:    Amy Stone

## 2010-08-30 NOTE — Assessment & Plan Note (Signed)
OFFICE VISIT   Amy Stone, Amy Stone  DOB:  1940/12/30                                       10/13/2008  CHART#:[id number]   I saw the patient in the office today for continued followup of her  aneurysm.  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  Her aneurysm had measured 4.3 cm on a previous ultrasound.  She had some mild abdominal pain and for this reason we obtained a CT  scan which showed the aneurysm measured 4.7 cm maximum diameter.   I explained at that time we generally would not consider elective repair  unless it reached 5.5 cm in maximum diameter.  She was scheduled for a 6  month followup visit.  Since I saw her last she has had no abdominal or  back pain.  There has been no significant change in her medical history.   SOCIAL HISTORY:  She quit tobacco 3 years ago.   REVIEW OF SYSTEMS:  She does admit to dyspnea on exertion.  She has had  no chest pain recently although does occasionally have chest pain and  has had a history of myocardial infarction in the past.  She has had no  recent symptoms.   PHYSICAL EXAMINATION:  General:  This is a pleasant 70 year old woman  who appears her stated age.  Vital signs:  Blood pressure is 106/62,  heart rate is 45.  Neck:  Supple.  I do not detect any carotid bruits.  Lungs:  Are clear bilaterally to auscultation.  Cardiac:  She has a  regular rate and rhythm.  Abdomen:  Soft and nontender.  Her aneurysm is  palpable and nontender.  She has diminished femoral pulses.  She does  have palpable dorsalis pedis pulses bilaterally.  She has no evidence of  atheroembolic disease.   Her ultrasound in our office today shows the maximum diameter of her  aneurysm is 4.3 cm.   I have reassured her there has been no change in the size of her  aneurysm and clearly the aneurysm is not large enough to consider  elective  repair.  I have recommended we see her back in 6 months with a  followup CT scan.  Again, we have explained we generally would not  consider elective repair unless the aneurysm reached 5.5 cm in maximum  diameter.   Di Kindle. Edilia Bo, M.D.  Electronically Signed   CSD/MEDQ  D:  10/13/2008  T:  10/14/2008  Job:  [id number]

## 2010-08-30 NOTE — Assessment & Plan Note (Signed)
[facility] OFFICE NOTE   Amy, Stone                      MRN:          [id number]  DATE:06/01/2008                            DOB:          [birth year]    PRIMARY CARE PHYSICIAN:  Karie Schwalbe, MD   HISTORY OF PRESENT ILLNESS:  [name] is a pleasant 70 year old  Caucasian female with a past medical history significant for  hyperlipidemia, hypothyroidism, and coronary artery disease, who is  status post an acute inferior myocardial infarction in August 2007, at  which time she received a bare-metal stent in her right coronary artery  followed by a four-vessel coronary artery bypass grafting surgery by Dr.  Donata Clay.  She was last seen in our Cardiology office in August 2009.  She tells me that she has been doing well from a cardiovascular  standpoint.  She denies any chest pain, shortness of breath,  palpitations, dizziness, near syncope, syncope, orthopnea, PND, or lower  extremity edema.  She has been able to do anything.  She walks around  the house and in the yard.  She stopped smoking at the time of her heart  attack in August 2007 and does not smoked a cigarette since then.  She  continues to take all of her medications as prescribed.  She was seen in  the office of Dr. Edilia Bo, who plans to follow her infrarenal abdominal  aortic aneurysm, which was 4.7 cm on the last check in December 2009.   CURRENT MEDICATIONS:  1. Atenolol 25 mg once daily.  2. Simvastatin 80 mg once daily.  3. Benazepril 10 mg once daily.  4. Synthroid 50 mcg alternating every other day with 25 mcg.  5. Aspirin 81 mg once daily.   REVIEW OF SYSTEMS:  As stated in the history of present illness, is  otherwise negative.   PHYSICAL EXAMINATION:  VITALS:  Blood pressure 140/62, pulse 60 and  regular, and respirations 12 and unlabored.  GENERAL:  She is a pleasant 70-year-old Caucasian female in no acute  distress.   She is alert and oriented x3.  SKIN:  Warm and dry.  HEENT:  Normal.  NECK:  No JVD.  No carotid bruits.  No thyromegaly.  No lymphadenopathy.  LUNGS:  Clear to auscultation bilaterally without wheezes, rhonchi, or  crackles noted.  CARDIOVASCULAR:  Regular rate and rhythm without murmurs, gallops or  rubs noted.  ABDOMEN:  Soft, nontender, and nondistended.  Bowel sounds are present.  EXTREMITIES:  No evidence of edema.  Pulses are 2+ in all extremities.   DIAGNOSTIC STUDIES:  1. Bilateral lower extremity arterial Dopplers showed no evidence of      segmental lower extremity arterial disease bilaterally.  ABI is      normal on the right and the left at 1.0.   ASSESSMENT AND PLAN:  This is a pleasant 70 year old Caucasian female  with a history of coronary artery disease, status post prior stenting  and four-vessel coronary artery bypass graft, as well as hyperlipidemia,  and hypothyroidism, who returns for routine cardiac visit.  [name]  has not described any symptoms that are suggestive of angina,  arrhythmias, or congestive heart failure.  She continues to take all of  her medications as prescribed.  She remains active and has not limited  in any of her activities of daily living.  Her blood pressure is  reasonably well controlled today.  I do not see any need to make any  medication changes at the current time.  I would like to see her back in  our office in 6 months and will repeat a transthoracic echocardiogram at  that time to reassess her left ventricular function.  I have asked her  to continue to follow with her primary care physician, Dr. Alphonsus Sias.     Verne Carrow, MD  Electronically Signed    CM/MedQ  DD: 06/01/2008  DT: 06/02/2008  Job #: [id number]   cc:   Karie Schwalbe, MD

## 2010-08-30 NOTE — Assessment & Plan Note (Signed)
OFFICE VISIT   Amy Stone, Amy Stone  DOB:  14-Nov-1940                                       04/08/2009  CHART#:[id number]   I saw this patient in the office today for continued follow up of her  abdominal aortic aneurysm.  When I last saw her on 10/13/2008, by  ultrasound, the maximum diameter was 4.3 cm.  We explained we generally  would not consider elective repair unless the aneurysm reached 5.5 cm in  maximum diameter.  Since I saw her last she has had no history of  abdominal or back pain.   The patient also describes bilateral lower extremity claudication that  involves her hips, thighs and calves at a fairly short distance.  This  pain is relieved with rest.  She has had no history of rest pain and no  history of nonhealing ulcers.  These symptoms have been stable over the  last year.   The patient also has a history of hypertension and hypercholesterolemia  which has been stable on her current medications.  She had a myocardial  infarction 4 years ago but has had no recent cardiac problems.  She  denies any history of diabetes, history of congestive heart failure or  history of COPD.   SOCIAL HISTORY:  She is married.  She has 3 children.  She quit tobacco  3 years ago.  She does not drink alcohol on a regular basis.   REVIEW OF SYSTEMS:  CARDIOVASCULAR:  She has had no chest pain, chest  pressure, palpitations or arrhythmias.  She has had no history of  stroke, TIAs or amaurosis fugax and no history of DVT.  PULMONARY:  She has had no productive cough, bronchitis, asthma or  wheezing.  MUSCULOSKELETAL:  She does admit to some occasional joint pain.   PHYSICAL EXAMINATION:  This is a pleasant 70 year old woman who appears  her stated age.  Her blood pressure is 135/75, heart rate 77,  respiratory rate 20.  Lungs:  Clear bilaterally to auscultation without  rales, rhonchi or wheezing.  Cardiovascular:  I do not detect any  carotid bruits.  She has  a regular rate and rhythm without murmur or  gallop appreciated.  She has no significant peripheral edema.  She has  slightly diminished femoral pulses and slightly diminished dorsalis  pedis pulses bilaterally.  I cannot palpate posterior tibial pulses.  She has palpable radial pulses.  Abdomen:  Soft and nontender.  Aneurysm  is palpable and nontender.  Neurologic:  She has no focal weakness or  paresthesias.   I have reviewed her CT scan which shows the maximum diameter of her  infrarenal aneurysm is 4.6 cm.  I compared this to her previous CT scan  from a year ago which showed that the aneurysm at that point was 4.7 cm.  Thus there has been no change in the size of her aneurysm.   I again explained that we would not consider elective repair of an  aneurysm unless it reached 5.5 cm in maximum diameter.  I have recommend  a follow-up ultrasound in 6 months and I will see her back at that time.   With respect to her claudication, her symptoms have remained stable.  We  discussed the potential option of proceeding with arteriography to  further evaluate her iliac disease based on her  CT scan.  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  We will  get follow-up ABIs when she comes back in 6 months.  If her symptoms  progress, she will let us know.  However, at this point she was not  interested in considering arteriography and I think this is reasonable.     Di Kindle. Edilia Bo, M.D.  Electronically Signed   CSD/MEDQ  D:  04/08/2009  T:  04/13/2009  Job:  [id number]

## 2010-09-02 NOTE — Assessment & Plan Note (Signed)
[facility]                            CARDIOLOGY OFFICE NOTE   SHARONLEE, NINE                      MRN:          [id number]  DATE:08/02/2006                            DOB:          04-27-1940    HISTORY OF PRESENT ILLNESS:  Mr. Auth is a 70 year old woman who  suffered inferior myocardial infarction in August 2007.  I opened the  culprit lesion with a stent, and then referred her for coronary artery  bypass grafting for her remaining multivessel disease.  Her ejection  fraction is preserved.  Since her surgery, she has had no recurrent  chest discomfort or dyspnea.  She remains very active, clearing her  property of brush yesterday.  She remains off cigarettes, and if feeling  quite well.   CURRENT MEDICATIONS:  1. Atenolol 25 mg daily.  2. Zocor 80 mg daily.  3. Benazepril 10 mg daily.  4. Aspirin 81 mg daily.   PHYSICAL EXAMINATION:  She is generally well appearing, in no distress.  Heart rate 63.  Blood pressure 124/74.  Weight of 125 pounds.  She has no thyromegaly, lymphadenopathy, or jugular venous distention.  LUNGS:  Clear to auscultation.  Respiratory effort is normal.  She has a nondisplaced point of maximal cardiac impulse.  There is a  regular rate and rhythm without murmur, rub, or gallop.  ABDOMEN:  Soft, non-distended, non-tender.  There is no  hepatosplenomegaly.  Bowel sounds are normal.  EXTREMITIES:  Warm without clubbing, cyanosis, edema, or ulceration.  Carotid pulses 2+ bilaterally.   Electrocardiogram demonstrates normal sinus rhythm with right atrial  enlargement, borderline left atrial enlargement, and is otherwise  unremarkable.   IMPRESSION/RECOMMENDATIONS:  1. Coronary disease, status post inferior myocardial infarction:      Recovering nicely after coronary artery bypass graft.  Continue      aspirin, ACE inhibitor, beta blocker.  2. Hypercholesterolemia:  Continue Zocor.  We will check lipids and  LFTs today.  3. Followup:  Will follow up in our [place] in 12 months' time.     Salvadore Farber, MD  Electronically Signed    WED/MedQ  DD: 08/02/2006  DT: 08/02/2006  Job #: [id number]

## 2010-09-02 NOTE — Discharge Summary (Signed)
NAME:  Amy Stone, Amy Stone NO.:  [id number]   MEDICAL RECORD NO.:  [id number]          PATIENT TYPE:  INP   LOCATION:  2020                         FACILITY:  [facility]   PHYSICIAN:  Kerin Perna, M.D.  DATE OF BIRTH:  11/17/1940   DATE OF ADMISSION:  11/24/2005  DATE OF DISCHARGE:                                 DISCHARGE SUMMARY   ADMISSION DATE:  November 24, 2005   DISCHARGE DATE:  Tentatively one to two days.   ADMISSION DIAGNOSIS:  Acute myocardial infarction with syncope.   DISCHARGE DIAGNOSES:  1. Acute myocardial infarction status post PCI to the RCA, CABG x3.  2. COPD.  3. Heavy tobacco abuse.  4. Arthritis.   FAMILY HISTORY:  1. CAD.  2. Hypothyroidism.   CONSULTS:  1. On November 24, 2005, Dr. Samule Ohm was consulted for cardiology.  2. On November 28, 2005, pulmonary was consulted.  3. On November 26, 2005, Dr. Sheliah Plane for cardiothoracic surgery was      consulted.   PROCEDURES:  1. On November 24, 2005, the patient underwent left heart catheterization,      left ventriculography, coronary angiography, and bare-metal stent      placement to the proximal right coronary artery by Dr. Randa Evens.  2. On November 29, 2005, the patient underwent coronary artery bypass      grafting x3 (left internal mammary artery to the left anterior      descending, saphenous vein graft to the ramus intermedius, saphenous      vein graft to the right coronary artery) by Dr. Kathlee Nations Trigt.  3. On November 27, 2005, the patient underwent pulmonary function tests.   HISTORY AND PHYSICAL:  The patient is a 70 year old female who had no  previous cardiac history other than a history of palpitations for many  years.  While at her granddaughter's birthday party on August 10th, patient  felt a wave of heat coming over her and indigestion which she attributed to  eating pizza.  The patient took some reflux pills and drank a glass of water  and had sudden syncope witnessed  by her family and was reportedly  unconscious for approximately two minutes.  Patient said she awoke with EMS  over top of her.  EKG showed an inferior infarct.  The patient was given  heparin, ReoPro, with Solu-Medrol and taken directly to the cath lab where  acute angioplasty of the right coronary artery with non-drug-eluting stent  was performed by Dr. Samule Ohm.  Initial CK-MB was 69 with an MB of 2.3.  Peak  CK was 1080 with an MB of 209 and a troponin of 25.69.  Patient tolerated  angioplasty well and is now stable.  Early after her angioplasty she had  episodes of nonsustained ventricular tachycardia.  Patient denied any  history of hypertension, hyperlipidemia, diabetes mellitus.  Patient is a  heavy smoker and smokes 1 1/2 packs a day for more than 50 years.  Patient  has a positive family history of coronary artery disease.  Patient presents  for coronary artery bypass grafting with persistent  disease in the right  coronary artery and disease in the left main/proximal left anterior  descending circumflex.  However, the risks and benefits were explained in  great detail and she has agreed to proceed.   HOSPITAL COURSE:  The patient underwent cardiac catheterization without any  complications and a successful PCI of the lesion in the RCA.  Post stent,  the patient did have some V-tach of 10 to 20 beats at a rate of 150.  V-tach  then began to stabilize.  The patient was treated with Lovenox post stent.  CVTS was consulted and a CABG was planned for November 29, 2005.  Patient did  have pulmonary function tests on November 27, 2005 which showed moderately  severe obstructive airway disease.  Tobacco cessation was consulted on  November 27, 2005.  Pulmonary saw the patient on November 28, 2005 and put her  on Atrovent and albuterol.   Patient underwent a CABG on November 29, 2005 without any complications.  Patient was given two units of packed red blood cells preoperatively.  On  the evening  of November 29, 2005, the patient was extubated without any  problems.  Her vital signs have remained stable and the patient has remained  in normal sinus rhythm.  Patient did have some acute blood loss anemia, she  did not require any further blood transfusion and her hemoglobin and  hematocrit have raised appropriately.  On postop day number four, the  patient did have some supraventricular tachycardia, atrial flutter, sinus  tachycardia and her beta-blocker was increased.  On postop day number five,  the patient's rhythm has remained stable in normal sinus rhythm, however her  blood pressure is slightly decreased.  The patient remains asymptomatic and  this continues to be followed.  Patient has postop volume overload, patient  is being diuresed and she has responded appropriately.   On postop day number two, the patient had some scattered rhonchi with a  moderate cough.  The patient's white blood cell count was also increased to  17,000 and she was started on IV Maxipime.  On postop day number five, the  patient's white blood cell count had decreased significantly and she was  started on p.o. Avelox and her IV Maxipime was discontinued.  The patient  had some postop nausea and vomiting, on postop day number three she was  treated with Phenergan and this resolved appropriately.   The patient is ambulating with cardiac rehab daily with a fairly steady  gait.  The patient's oxygen is being weaned slowly.   PHYSICAL EXAMINATION:  VITALS:  Blood pressure 96/63, heart rate 88,  temperature 98.3, O2 sat 96% on room air.  The patient's weight is 51.2  kilograms, preop weight is 50 kilograms.  HEART:  Regular rate and rhythm.  SKIN:  Clear and dry.  LUNGS:  Clear.  EXTREMITIES:  No edema.   Patient has a CBC which shows a white blood cell count of 13.5, hemoglobin 11.1, hematocrit 32.1, platelet count of 258.  Patient has a TSH which is  pending.   Patient will be discharged home in the  next one to two days provided she  remains stable and continues to improve appropriately.   DISPOSITION:  The patient will be discharged to home in stable condition.   INSTRUCTIONS:  Patient instructed to follow a low fat, low-salt diet.  No  driving or heavy lifting greater than ten pounds for three weeks.  The  patient is to ambulate three  to four times daily and increase activity as  tolerated.  She is to continue her breathing exercises.  The patient may  shower and clean her wounds with mild soap and water.  Patient is to call  the office if any wound problems shall arise.  Patient was instructed to  quit smoking.   MEDICATIONS:  1. Aspirin 325 mg p.o. daily.  2. Calcium plus vitamin D daily.  3. Estropipate 0.125 mg p.o. daily.  4. Toprol 750 mg p.o. daily.  5. Altace 2.7 mg p.o. daily.  6. Lipitor 80 mg p.o. daily.  7. Oxycodone two tablets every four hours p.r.n.  8. Avelox 400 mg p.o. daily x5 days.  9. Tylenol p.r.n.  10.Synthroid 75 mcg p.o. daily.  11.Plavix 75 mg p.o. daily.  12.Nicotine patch, apply to skin daily.  13.Lasix 40 mg p.o. daily x5 days.  14.Potassium chloride 20 mEq p.o. daily x5 days.  15.Combivent inhalation two puffs t.i.d.  16.Albuterol inhaler two puffs every four hours p.r.n.   FOLLOWUP:  The patient is to call Dr. Samule Ohm for an appointment in two  weeks.  Patient will have a chest x-ray taken at Dr. Melinda Crutch office.  Patient will see Dr. Donata Clay on September 14th at 11:15 a.m.  Patient  should follow up with pulmonary as needed.      Constance Holster, [place]      Kerin Perna, M.D.  Electronically Signed    JMW/MEDQ  D:  12/04/2005  T:  12/04/2005  Job:  [id number]   cc:   Salvadore Farber, MD

## 2010-09-02 NOTE — Op Note (Signed)
NAME:  Amy Stone, Amy Stone NO.:  [id number]   MEDICAL RECORD NO.:  [id number]          PATIENT TYPE:  INP   LOCATION:  2309                         FACILITY:  [facility]   PHYSICIAN:  Kerin Perna, M.D.  DATE OF BIRTH:  10-08-1940   DATE OF PROCEDURE:  11/29/2005  DATE OF DISCHARGE:                                 OPERATIVE REPORT   PROCEDURE:  Coronary artery bypass grafting x3 (left internal mammary artery  to the left anterior descending, saphenous vein graft to ramus intermediate,  saphenous vein graft to right coronary artery).   PREOPERATIVE DIAGNOSIS:  Acute myocardial infarction with unstable angina  and left main and three vessel coronary  disease.   POSTOPERATIVE DIAGNOSIS:  Acute myocardial infarction with unstable angina  and left main and three-vessel coronary disease.   SURGEON:  Kerin Perna, M.D.   ASSISTANT:  Danelle Earthly, P.A-C.   ANESTHESIA:  General by Dr. Bedelia Person.   MEDICATIONS:  The patient is a 70 year old white female smoker with  significant COPD who presented with acute chest pain and an acute DMI.  She  was taken for emergency catheterization by Dr. Samule Ohm who performed a PCI of  a totally occluded proximal right coronary artery.  Her CPK-MB peaked at 250  ng/mL.  She remained hemodynamically stable, however, Her CPK-MB AP and ACT  to 115 nanogram Pravachol.  She remained hemodynamically stable, however and  was maintained on ReoPro for the first 48 hours after the stent in the right  coronary.  Because of her residual three-vessel disease and left main  stenosis, she is felt to be a candidate for surgery prior to her discharge  from hospital.   Prior to surgery I examined the patient in her intensive care unit room and  reviewed the results of her cardiac catheterization and 2-D echo the patient  and husband.  I discussed indications and benefits of coronary bypass  surgery for treatment of coronary disease.  I reviewed the  alternatives to  surgical therapy as well.  I discussed with the patient the major aspects of  the planned procedure including the choice of conduit for grafts to include  internal mammary artery and endoscopically harvested saphenous vein, the  location of the surgical incisions, use of general anesthesia and  cardiopulmonary bypass, and the expected postoperative hospital recovery.  I  discussed with the patient the risks to her of coronary artery bypass  surgery including the risks of MI, CVA, bleeding, blood transfusion  requirement, infection, and death.  After reviewing these issues, she  demonstrated her understanding and agreed to proceed with the operation as  planned under what I felt was an informed consent.   OPERATIVE FINDINGS:  The patient was given the aprotinin protocol due to her  recent acute DMI and the high degree of anticoagulation she received with  antiplatelet agents.  The patient received 2 units of packed cells on  arrival to the operating room for a hematocrit of 25%.  The vein was  harvested endoscopically from the right leg and was of good quality.  The  mammary artery was a small but adequate vessel with excellent flow.  The  patient has severe emphysema and COPD with hyperinflation of the lungs which  cover the presternal space.   DESCRIPTION OF PROCEDURE:  The patient was brought to operating room and  placed supine on the operating table where general anesthesia was induced  under invasive hemodynamic dynamic monitoring.  The chest, abdomen and legs  were prepped with Betadine and draped as a sterile field.  A left femoral  arterial line was placed after the patient was prepped due to nonfunctioning  arterial lines and a right radial space in the right femoral area.  The left  femoral A-line worked fine.  A sternal incision was made and the saphenous  vein was harvested endoscopically from the right leg.  The left internal  mammary artery was harvested as  a pedicle graft from its origin at  subclavian vessels.  Heparin was administered and ACT was documented as  being therapeutic.  The sternal retractor was placed, and the pericardium  was opened and suspended.  Pursestrings were placed in the ascending aorta  and right atrium and the patient was cannulated and placed on bypass.  The  coronaries were identified for grafting.  The distal right coronary was  high, and the AV groove close the coronary sinus and vena cava and was very  small and the distal posterolateral branches were too small to graft.  The  graft was placed to the large right coronary branch bifurcation.  The  patient was cooled to 32 degrees and a cardioplegia catheter was placed in  the ascending aorta.  The aortic crossclamp was applied, and 800 mL of cold  blood cardioplegia was delivered with good cardioplegic arrest.  Septal  temperature dropped less than 12 degrees.   The distal coronary anastomoses were performed.  The first distal  anastomosis was to the distal right coronary.  This a 1.5 mm vessel and had  been previously totally occluded and was treated with a stent (bare metal).  A reverse saphenous vein was sewn end-to-side with a running 7-0 Prolene.  There is good flow through graft.  The second distal anastomosis was to  ramus intermediate.  This had a proximal 80% stenosis and a reverse  saphenous vein was sewn end-to-side with a running 7-0 Prolene.  There was  good flow through the graft.  Cardioplegia was redosed.  The third distal  anastomosis was to the proximal aspect of the LAD.  This is a 1.5 mm vessel  in a proximal 80-90% stenosis.  The left IMA pedicle was brought through an  opening created in the left lateral pericardium which was brought down onto  the LAD and sewn end-to-side with running 8-0 Prolene.  There is excellent  flow through the anastomosis after briefly releasing the pedicle bulldog on the mammary pedicle.  The bulldog was replaced  and the pedicle was secured  top the epicardium.   Cardioplegia was redosed.  While the crossclamp was still in place, two  proximal anastomoses were placed on the ascending aorta using a 4.0-mm punch  running 7-0 Prolene.  Prior to tying down the final proximal anastomosis,  air was vented from the aorta and the coronaries with the usual maneuvers on  bypass.  The final proximal anastomosis was tied and the crossclamp was  removed.   The heart resumed a spontaneous rhythm.  Air was aspirated from the vein  grafts with a 27 gauge needle.  The  grafts were opened.  Each had good flow  and hemostasis was documented at the proximal and distal anastomoses.  The  patient was rewarmed to 37 degrees.  Temporary pacing wires were applied.  When the patient was rewarmed, the lungs re-expanded and the ventilator was  resumed.  The patient was then weaned from bypass without difficulty.  Cardiac output and blood pressure were stable.  Protamine was administered  without adverse reaction.  The cannulas were removed.  The mediastinum was  irrigated with warm antibiotic irrigation.  The leg incision was irrigated  and closed in a standard fashion.  The superior pericardial fat was closed  over the aorta and vein grafts.  Two mediastinal and a left pleural chest  tube were placed and brought out through separate incisions.  The sternum  was closed with  interrupted steel wire.  The pectoralis fascia was closed in running #1  Vicryl.  The subcutaneous and skin layers were closed with a running Vicryl  and sterile dressings were applied.  Total bypass time was 92 minutes with  crossclamp time of 60 minutes.      Kerin Perna, M.D.  Electronically Signed     PV/MEDQ  D:  11/29/2005  T:  11/29/2005  Job:  [id number]   cc:   Salvadore Farber, MD  [facility] office

## 2010-09-02 NOTE — Assessment & Plan Note (Signed)
[facility]                              CARDIOLOGY OFFICE NOTE   RICARDO, KAYES                      MRN:          [id number]  DATE:12/21/2005                            DOB:          1940/07/05    HISTORY OF PRESENT ILLNESS:  Ms. Schriever is a 70 year old woman who presented  with inferior myocardial infarction on November 24, 2005.  I found her to have  multi-vessel coronary disease with acute occlusion of the right coronary  artery.  I opened this with a combination of balloon angioplasty and  provisional stenting.  She then underwent  coronary artery bypass grafting  by Dr. Donata Clay.  Postoperative course was uncomplicated.  Ejection  fraction remains preserved.   Since her surgery, Ms. Leibensperger has continued to do very nicely.  She has quit  smoking.  She says she is feeling better than she was prior to her heart  attack.  She has not had any chest discomfort, claudication, palpitation,  syncope or presyncope.  She does have some exertional dyspnea which is  unchanged from prior to her MI.   CURRENT MEDICATIONS:  1. Toprol XL 50 mg per day.  2. Lipitor 80 mg per day.  3. Plavix 75 mg per day.  4. Altace 2.5 mg per day.  5. Aspirin 325 mg per day.   PHYSICAL EXAMINATION:  GENERAL:  She is generally well-appearing, in no  distress.  VITAL SIGNS:  Heart rate 90, blood pressure 120/70, weight 104 pounds.  NECK:  She has no thyromegaly, lymphadenopathy or jugular venous distention.  LUNGS:  Clear to auscultation though expiratory phase is markedly prolonged  and diaphragmatic exertion is minimal.  HEART:  She has a nonpalpable point  of maximal cardiac impulse.  There is a regular rate and rhythm without  murmur, rub or gallop.  The median sternotomy is healing nicely as are her  epigastric incisions.  ABDOMEN:  Soft, nondistended, nontender.  There is no hepatosplenomegaly.  Bowel sounds are normal.  There is no pulsatile midline mass.  EXTREMITIES:  Warm without clubbing, cyanosis, edema or ulceration.  Carotid  pulses are 2+ bilaterally.  DP and PT pulses are not palpable on either  side.   Electrocardiogram demonstrates normal sinus rhythm with right atrial  enlargement and an evolving inferior myocardial infarction.   IMPRESSION/RECOMMENDATION:  1. Doing very nicely after inferior myocardial infarction, treated with      stenting of the right coronary artery and subsequent coronary artery      bypass grafting for her left main stenosis.  She is quite concerned      about her medication cost.  We will, therefore, switch her to generics      available at [facility].  We will discontinue the metoprolol in favor of      atenolol 25 mg per day.  We will discontinue the Altace in favor of      benazepril 10 mg per day.  We will discontinue the Lipitor in favor of      simvastatin 80 mg per day.  2. Hypercholesterolemia:  Switched  to simvastatin as above.  3. COPD:  Follow up with Pulmonary.                                 Salvadore Farber, MD    WED/MedQ  DD:  12/21/2005  DT:  12/21/2005  Job #:  [id number]

## 2010-09-02 NOTE — Consult Note (Signed)
NAME:  Amy Stone, Amy Stone NO.:  [id number]   MEDICAL RECORD NO.:  [id number]          PATIENT TYPE:  INP   LOCATION:  2309                         FACILITY:  [facility]   PHYSICIAN:  Sheliah Plane, MD    DATE OF BIRTH:  01-10-1941   DATE OF CONSULTATION:  11/26/2005  DATE OF DISCHARGE:                                   CONSULTATION   REQUESTING PHYSICIAN:  Willa Rough, M.D.   FOLLOW UP CARDIOLOGIST:  Willa Rough, M.D.   PRIMARY CARE PHYSICIAN:  None.   REASON FOR CONSULTATION:  Acute myocardial infarction with syncope.   HISTORY OF PRESENT ILLNESS:  The patient is a 70 year old female who had no  previous cardiac history other than a history of palpitations for many  years.  While at her granddaughter's birthday party on August 10, felt a  wave of heat coming over her and ingestion which she attributed to eating  pizza.  She took some reflux pills and drank a glass of water and had  sudden syncope witnessed by her family and was reportedly unconscious for  approximately 2 minutes.  She says she awoke with EMS over top of her.  EKG  showed an inferior infarct.  The patient was given heparin, ReoPro, bolus  Solu-Medrol and taken directly to the Cath Lab where acute angioplasty of  the right coronary artery with a non-drug eluting stent was performed by Dr.  Samule Ohm.  Initial CK MB was 69 with an MB of 2.3.  Peak CK was 1080 with an  MB of 209 and a troponin of 25.69.  She tolerated angioplasty well and is  now stable.  Early after angioplasty she had episodes of nonsustained  ventricular tachycardia.   CARDIAC RISK FACTORS:  Patient denies hypertension, denies hyperlipidemia,  denies diabetes.  She is a heavy smoker of up to 1 to 1-1/2 packs per day  for more than 50 years.  Positive family history for a myocardial infarction  in her mother at age 32.  Has a sister, a niece and a nephew all with  diabetes.  She complains of bilateral hip claudication and night  cramps in  her calves.   PAST MEDICAL HISTORY:  Significant for osteoarthritis.   PAST SURGICAL HISTORY:  1. Partial thyroidectomy 25 years ago for a goiter.  2. History of hysterectomy.   SOCIAL HISTORY:  Patient is married and lives with her husband.  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  Denies alcohol use.   MEDICATIONS AT THE TIME OF ADMISSION:  1. Estrogen 1 pill, unknown dose.  2. Thyroid 1 pill per day, unknown dose.  3. Calcium.  4. Vitamin D.  5. Tylenol Arthritis every other day.   DRUG ALLERGIES:  1. PREDNISONE CAUSES HER TO BE WILD.  2. ALLERGY TO CONTRAST, CAUSES HIVES.   CARDIAC REVIEW OF SYSTEMS:  Positive for chest pain, longstanding  palpitations for many years and one episode of acute syncope as presenting  symptom.   GENERAL REVIEW OF SYSTEMS:  The patient's weight has been stable, denies  constitutional symptoms.  She does  have some wheezing, especially with  exertion.  Denies recurrent pneumonia.  GASTROINTESTINAL:  Denies blood in  her stool or urine.  Has no previous history of gallbladder disease.  NEUROLOGIC:  Denies amaurosis or TIAs.  GU:  Denies any blood in her urine.  She has a known goiter and notes that a goiter is developing in the right  neck.  PSYCHIATRIC HISTORY:  Denies.   PHYSICAL EXAM:  The patient is awake and alert, neurologically intact, in  sinus rhythm.  Blood pressure is 105/60, heart rate is 70, respiratory rate  is 18 and in no distress.  The patient is 5 feet tall, weighs 110 pounds.  The patient is neurologically intact and able to relate her history.  Her  pupils are equal, round, reactive to light.  She has slight fullness in her  right neck, no carotid bruits, no jugular venous distention.  Patient does  have some cough but auscultation of the lungs reveal no wheezing or rhonchi.  CARDIAC EXAM:  Reveals a regular rate and rhythm without murmur or gallop.  ABDOMINAL EXAM:  Reveals a thin abdomen with an easily  palpable aorta that  does not feel palpably enlarged.  The right groin cath site is with minimal  hematoma.  EXTREMITIES:  She has decreased pulses at the ankles with absent posterior  tibial pulses bilaterally and 1+ DP pulses bilaterally.   LABORATORY FINDINGS:  In addition to the troponin and CK noted above, a  hematocrit of 38.7, white count of 14.7 on the 10th and up to 21,000 today.  Cholesterol is 163, BUN is 5, creatinine 0.8.  Cardiac cath films are  reviewed.  Patient presented with acute proximal occlusion of the right  coronary artery.  Angioplasty and non-drug eluting stent were placed, still  has 80% posterior descending and diffuse disease throughout the proximal  right coronary artery.  In addition, he had 80% stenosis at the distal left  main bifurcation of the LAD and circumflex.   IMPRESSION:  A patient who presented with acute myocardial infarction and  syncope and post angioplasty had runs of nonsustained ventricular  tachycardia.  Presents for coronary artery bypass grafting with the  persistent disease in the right coronary artery and disease in the left  main/proximal left anterior descending circumflex.  Agree that in the long  run coronary artery bypass grafting would benefit the patient.  She has  received ReoPro at the time of catheterization and is now on Lovenox.  No  Plavix was given.  We discussed risks and options with the patient and she  is willing to proceed with coronary artery bypass grafting.  This could  tentatively be planned for August 14th or 15th, depending on patient's  progress and her pulmonary status.  Will obtain bedside pulmonary function  tests, as she is a heavy smoker.  Pulmonary is to see the patient in  consultation.  Will discuss the case with Dr. Donata Clay who potentially  could proceed with surgery on Wednesday, the 15th.      Sheliah Plane, MD  Electronically Signed    EG/MEDQ  D:  11/26/2005  T:  11/26/2005  Job:   [id number]   cc:   Luis Abed, MD, [facility]

## 2010-09-02 NOTE — H&P (Signed)
NAME:  Amy Stone, CHIMENTI NO.:  [id number]   MEDICAL RECORD NO.:  [id number]          PATIENT TYPE:  INP   LOCATION:  1823                         FACILITY:  [facility]   PHYSICIAN:  Lowella Bandy, MD      DATE OF BIRTH:  10/03/1940   DATE OF ADMISSION:  11/24/2005  DATE OF DISCHARGE:                                HISTORY & PHYSICAL   CHIEF COMPLAINT:  Chest pain.   HISTORY OF PRESENT ILLNESS:  Amy Stone is a pleasant 70 year old female,  who is a smoker but has no other known previous cardiac risk factors, who  started having chest pain at approximately 7:30 p.m. while at her  granddaughter's birthday party.  She reported that the pain was substernal,  with associated shortness of breath.  She also began to feel hot, weak,  and somewhat dizzy.  She denies any frank loss of consciousness.  Emergency  medical services were called, and EKG enroute showed changes consistent with  an inferior ST elevation MI.  The patient was given aspirin 325 mg orally  and nitroglycerin sublingual nitroglycerin x3 enroute.  On arrival to the  emergency department, she is still complaining of 8/10 chest pain.  Her EKG  here confirmed an inferior ST elevation MI.  She has also had some nausea  and vomiting in the emergency department, as well as shortness of breath.  She has been given 4000 units of IV heparin bolus and a Reapro bolus in the  emergency department after discussion with Dr. Samule Ohm.  She was also given  IV Solu-Medrol, Pepcid and Benadryl in the emergency department for a  reported history of contrast dye allergy.   PAST MEDICAL HISTORY:  Osteoarthritis.   MEDICATIONS:  1. Estrogen.  2. Tylenol arthritis.   ALLERGIES:  CONTRAST DYE, reports a history of breaking out and diffuse  rash.   SOCIAL HISTORY:  Smokes just under 1 pack per day, and has smoked for well  over 40 years.  She denies any alcohol use.   FAMILY HISTORY:  Her mother died of a myocardial infarction at  age unknown.   REVIEW OF SYSTEMS:  Ten systems reviewed are negative, other than as noted  above in the HPI.  Specifically, she denies any previous history of stroke,  GI bleeding, melena, hematochezia or renal dysfunction.   PHYSICAL EXAMINATION:  VITAL SIGNS:  Blood pressure 130/60, pulse 105,  oxygen saturation 97% on 2 liter nasal cannula.  GENERAL:  The patient is breathing comfortably; in no apparent distress.  HEENT:  Normocephalic and atraumatic.  Oropharynx clear.  Edentulous with  dentures present.  NECK:  Supple, no masses; no JVD or carotid bruits.  CARDIOVASCULAR:  Regular rate and rhythm.  No murmurs, rubs or gallops.  CHEST:  Clear to auscultation bilaterally.  ABDOMEN:  Soft, nontender, nondistended.  Normal bowel sounds.  EXTREMITIES:  Warm, no edema.  VASCULAR:  Two-plus femoral and dorsalis pedis pulses bilaterally.  NEUROLOGIC:  Alert and oriented x3.  Cranial nerves grossly intact.  Moving  all extremities well.  SKIN:  No rash.  MUSCULOSKELETAL:  No joint effusions or erythema.   EKG:  Sinus tachycardia. Changes consistent with inferior ST elevation MI,  with anterior and lateral reciprocal changes.   LABORATORY VALUES:  Pending.   ASSESSMENT/PLAN:  Amy Stone is a 70 year old female with an acute inferior  ST elevation MI.  She has been given aspirin and IV heparin and IV Reapro in  the emergency department; will be taken to the cardiac catheterization for  emergent PCI.   1. As noted above, has been given aspirin, heparin and Reapro bolus.  We      have held off on beta blockers at the current time until after PCI,      when we get a better idea of what her underlying heart rate and blood      pressure will look like. There is some concern with an inferior ST      elevation MI for increased vagal tone with low blood pressures and      heart rates.  We will start a statin empirically and check a fasting      lipid profile.   1. She has also been given IV  Solu-Medrol, Pepcid and Benadry for reported      history of contrast dye allergy.   1. The risks and benefits of cardiac catheterization and percutaneous      coronary intervention were discussed with the patient.  She agrees to      proceed and consent has been signed and is on the chart.      Lowella Bandy, MD  Electronically Signed     JJC/MEDQ  D:  11/24/2005  T:  11/24/2005  Job:  [id number]

## 2010-09-02 NOTE — Cardiovascular Report (Signed)
NAMEMarland Kitchen  Amy, Stone NO.:  [id number]   MEDICAL RECORD NO.:  [id number]          PATIENT TYPE:  INP   LOCATION:  2309                         FACILITY:  [facility]   PHYSICIAN:  Salvadore Farber, MD  DATE OF BIRTH:  07/20/1940   DATE OF PROCEDURE:  11/24/2005  DATE OF DISCHARGE:                              CARDIAC CATHETERIZATION   PROCEDURE:  1. Left heart catheterization.  2. Left ventriculography.  3. Coronary angiography.  4. Bare metal stent placement to the proximal right coronary artery.   INDICATION:  Ms. Uselton is a 70 year old woman with minimal past medical  history.  She has active tobacco abuse.  She suffered the onset of chest  discomfort at 7:30 p.m. today.  She presented to the [facility] Emergency  Room via EMS.  Electrocardiogram demonstrated acute inferior myocardial  infarction.  She was brought emergently to the cardiac catheterization lab  for angiography and possible percutaneous coronary intervention.   PROCEDURAL TECHNIQUE:  Informed consent was obtained.  Under 1% lidocaine  local anesthesia, a 6-French sheath was placed in the right common femoral  artery and a 6-French sheath in the right common femoral vein using the  modified Seldinger technique.  Diagnostic angiography was performed using JL-  4 diagnostic catheter for the left system and JR-4 guiding catheter for the  right system.  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   Anticoagulation had been initiated with heparin and ReoPro in the emergency  room.  Aspirin had been given by EMS.  ACT was confirmed to be greater than  200 seconds and maintained such throughout the case.  A Prowater wire was  advanced across the occlusion into the distal right coronary without  difficulty.  The lesion was then dilated using a 2.5 x 12 mm Maverick at up  to 10 atmospheres.  With this, TIMI 3 flow was established.  With  establishment  of flow, it was clear that she had very severe diffuse disease  throughout the proximal and mid right coronary artery as well as a severe  ostial stenosis of the large posterior descending artery.  She also had  significant stenosis of the left main coronary artery extending into the  ostium of the left anterior descending, and she had an approximately 80%  stenosis.  Based on these findings, I elected to refer her for eventual  coronary artery bypass grafting and to simply stabilize the culprit to allow  persistent TIMI 3 flow while she awaited that procedure.   To that end, I aimed for balloon angioplasty alone.  I used a 2.25 x 30 mm  Maverick balloon to treat the mid-right coronary.  Inflated it to 6  atmospheres for 90 seconds.  This resulted in improvement in the stenosis in  that segment.  However, there remained severe stenosis at the site of the  initial occlusion that did not open fully with balloon inflation.  I then  advanced a 2.25 x 12 mm Quantum balloon to this site and inflated to 14  atmospheres.  With  this, the lesion did open.  However, there was severe  recoil at that site.  Given the severe recoil, I did not think that the  result was stable to await CABG.  I therefore decided to place a bare metal  stent over the site of the culprit to reduce the risk of acute or subacute  thrombosis.  I therefore placed a 2.5 x 12 mm mini Vision stent deployed at  12 atmospheres.  I postdilated the stent using a 2.5 x 8 mm Quantum at 16  atmospheres.  Final angiography demonstrated no residual stenosis in the  stented segment and TIMI 3 flow through the remainder of the right coronary.  There was no dissection.  The patient tolerated the procedure well and was  transferred to the intensive care unit in stable condition.  Sheaths will be  removed when the ACT is less than 175 seconds.   COMPLICATIONS:  None.   TIMING:  Pain onset 1930, [facility] arrival 2035, cath lab arrival 2108,   reperfusion 2132.   FINDINGS:  1. LV:  107/9/16.  EF 70% with mild inferior hypokinesis.  2. No aortic stenosis or mitral regurgitation.  3. Left main:  The left main is very long.  There is a distal stenosis      which is in continuity with the ostial LAD stenosis which is up to 80%.  4. LAD:  Moderate-sized vessel giving rise to a single large diagonal.      There is an at least 80% stenosis at the ostium.  5. Circumflex:  Moderate-sized vessel giving rise to a single marginal.      There is at least moderate ostial disease.  6. RCA:  Very large, dominant vessel.  It was occluded proximally.  This      was treated with bare metal stent.  There remained severe diffuse      disease throughout the mid vessel and 80% stenosis at the ostium of the      posterior descending artery.  7. The heart is extremely vertical which made imaging of the left system      difficult.   IMPRESSION/PLAN:  Successful percutaneous revascularization of the culprit  lesion in the right coronary artery restoring TIMI 3 flow.  She has residual  severe disease in both the remainder of the right coronary artery as well as  the left main coronary artery and ostium of the left anterior descending.  Based on this residual disease, I recommend consideration of coronary artery  bypass grafting.  In determining timing of surgery, consideration should be  given to the ReoPro that was administered.   The patient appears to have significant emphysema.  We will need to assess  her pulmonary status before considering CABG.   Plavix will be withheld given the consideration of CABG.  Beta blocker will  currently be held due to her hypotension.      Salvadore Farber, MD  Electronically Signed     WED/MEDQ  D:  11/24/2005  T:  11/25/2005  Job:  [id number]

## 2010-10-06 ENCOUNTER — Ambulatory Visit: Payer: Medicare Other | Admitting: Family Medicine

## 2010-10-15 ENCOUNTER — Other Ambulatory Visit: Payer: Self-pay | Admitting: Family Medicine

## 2011-01-10 ENCOUNTER — Other Ambulatory Visit: Payer: Medicare Other

## 2011-01-11 ENCOUNTER — Other Ambulatory Visit (INDEPENDENT_AMBULATORY_CARE_PROVIDER_SITE_OTHER): Payer: Medicare Other

## 2011-01-11 DIAGNOSIS — I10 Essential (primary) hypertension: Secondary | ICD-10-CM

## 2011-01-11 DIAGNOSIS — E785 Hyperlipidemia, unspecified: Secondary | ICD-10-CM

## 2011-01-11 LAB — LIPID PANEL
Cholesterol: 132 mg/dL (ref 0–200)
LDL Cholesterol: 66 mg/dL (ref 0–99)
Total CHOL/HDL Ratio: 3
Triglycerides: 85 mg/dL (ref 0.0–149.0)
VLDL: 17 mg/dL (ref 0.0–40.0)

## 2011-01-11 LAB — HEPATIC FUNCTION PANEL
Albumin: 3.9 g/dL (ref 3.5–5.2)
Alkaline Phosphatase: 85 U/L (ref 39–117)
Total Protein: 7.2 g/dL (ref 6.0–8.3)

## 2011-01-17 ENCOUNTER — Ambulatory Visit: Payer: Medicare Other | Admitting: Family Medicine

## 2011-01-19 ENCOUNTER — Ambulatory Visit (INDEPENDENT_AMBULATORY_CARE_PROVIDER_SITE_OTHER): Payer: Medicare Other | Admitting: Family Medicine

## 2011-01-19 ENCOUNTER — Encounter: Payer: Self-pay | Admitting: Family Medicine

## 2011-01-19 VITALS — BP 120/70 | HR 60 | Temp 98.5°F | Ht 66.0 in | Wt 132.8 lb

## 2011-01-19 DIAGNOSIS — I10 Essential (primary) hypertension: Secondary | ICD-10-CM

## 2011-01-19 DIAGNOSIS — R0789 Other chest pain: Secondary | ICD-10-CM | POA: Insufficient documentation

## 2011-01-19 DIAGNOSIS — Z1231 Encounter for screening mammogram for malignant neoplasm of breast: Secondary | ICD-10-CM

## 2011-01-19 DIAGNOSIS — E785 Hyperlipidemia, unspecified: Secondary | ICD-10-CM

## 2011-01-19 DIAGNOSIS — Z23 Encounter for immunization: Secondary | ICD-10-CM

## 2011-01-19 NOTE — Assessment & Plan Note (Signed)
No specific changes on EKG, no new changes on comparison from 2011 EKG. Pain may be similar to pain in past from surgical clips.  No clear infection, lung issue, GERD or anxiety.  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  She will follow up with Cards if continuing.

## 2011-01-19 NOTE — Assessment & Plan Note (Signed)
Well controlled. Continue current medication.  

## 2011-01-19 NOTE — Patient Instructions (Signed)
If chest pain continuing.Amy KitchenMarland KitchenMake appointment to see cardiologist for further eval. If severe chest pain or shortness of breath.. Go to ER.

## 2011-01-19 NOTE — Assessment & Plan Note (Signed)
Well controlled. Continue current medication. No SE despite high dose simvastatin...cannot afford other statins easily.

## 2011-01-19 NOTE — Progress Notes (Signed)
Addended by: Kerby Nora E on: 01/19/2011 10:11 AM   Modules accepted: Orders

## 2011-01-19 NOTE — Progress Notes (Signed)
  Subjective:    Patient ID: Amy Stone, female    DOB: September 16, 1940, 70 y.o.   MRN: [id number]  HPI  70 year old female here for follow up  6 months on:  Hypertension:  Well controlled on benazapril  Using medication without problems or lightheadedness:  Chest pain with exertion:None Occ intermittant pain in RIGHT upper chest.  Last 5-10 minutes. LAst episode yesterday. Spontaneous, no relationship to eating. No change with stretching or movement. Occ cramping in lower rib cage, B. Did have some aytpical pain in chest wall 2011... Thought to be from surgical clips from past surgery. Edema:None Short of breath:None Average home BPs: well controlled at home, checking occasionally. Other issues: LAst saw cardiologist 1 year ago  Elevated Cholesterol:On simvastain 80 mg daily.Marland KitchenMarland KitchenLDL at goal <70 (hx of CAD) Using medications without problems:None Muscle aches: None Diet compliance: Good Exercise: Gardening a lot, yard maintenance. Other complaints:    Review of Systems  Constitutional: Negative for fever and fatigue.  HENT: Negative for ear pain, congestion and rhinorrhea.   Eyes: Negative for pain.  Respiratory: Positive for cough.        Occ dry cough.. When outside with allergies  Cardiovascular: Positive for chest pain. Negative for palpitations and leg swelling.  Gastrointestinal: Negative for abdominal pain.       Objective:   Physical Exam  Constitutional: Vital signs are normal. She appears well-developed and well-nourished. She is cooperative.  Non-toxic appearance. She does not appear ill. No distress.  HENT:  Head: Normocephalic.  Right Ear: Hearing, tympanic membrane, external ear and ear canal normal. Tympanic membrane is not erythematous, not retracted and not bulging.  Left Ear: Hearing, tympanic membrane, external ear and ear canal normal. Tympanic membrane is not erythematous, not retracted and not bulging.  Nose: No mucosal edema or rhinorrhea. Right  sinus exhibits no maxillary sinus tenderness and no frontal sinus tenderness. Left sinus exhibits no maxillary sinus tenderness and no frontal sinus tenderness.  Mouth/Throat: Uvula is midline, oropharynx is clear and moist and mucous membranes are normal.  Eyes: Conjunctivae, EOM and lids are normal. Pupils are equal, round, and reactive to light. No foreign bodies found.  Neck: Trachea normal and normal range of motion. Neck supple. Carotid bruit is not present. No mass and no thyromegaly present.  Cardiovascular: Normal rate, regular rhythm, S1 normal, S2 normal, normal heart sounds, intact distal pulses and normal pulses.  Exam reveals no gallop and no friction rub.   No murmur heard. Pulmonary/Chest: Effort normal and breath sounds normal. Not tachypneic. No respiratory distress. She has no decreased breath sounds. She has no wheezes. She has no rhonchi. She has no rales.       No chest walll ttp,  no shoulder pain or neck pain with ROM on right  Abdominal: Soft. Normal appearance and bowel sounds are normal. There is no tenderness.  Neurological: She is alert.  Skin: Skin is warm, dry and intact. No rash noted.  Psychiatric: Her speech is normal and behavior is normal. Judgment and thought content normal. Her mood appears not anxious. Cognition and memory are normal. She does not exhibit a depressed mood.          Assessment & Plan:

## 2011-02-13 ENCOUNTER — Other Ambulatory Visit: Payer: Self-pay | Admitting: *Deleted

## 2011-02-13 DIAGNOSIS — I714 Abdominal aortic aneurysm, without rupture: Secondary | ICD-10-CM

## 2011-02-13 DIAGNOSIS — I739 Peripheral vascular disease, unspecified: Secondary | ICD-10-CM

## 2011-02-14 ENCOUNTER — Ambulatory Visit: Payer: Self-pay | Admitting: Family Medicine

## 2011-02-14 ENCOUNTER — Encounter: Payer: Self-pay | Admitting: Family Medicine

## 2011-03-28 ENCOUNTER — Telehealth: Payer: Self-pay

## 2011-03-28 DIAGNOSIS — I714 Abdominal aortic aneurysm, without rupture: Secondary | ICD-10-CM

## 2011-03-28 NOTE — Telephone Encounter (Signed)
Pt. Is scheduled  For CTA of abd and pelvis 05/10/11 to f/u on AAA.  Made aware per [facility] that pt. Is allergic to IV contrast and Prednisone.  Discussed w/ Dr. Edilia Bo.  Received verbal order to change to CT scan abdomen/pelvis without contrast.

## 2011-05-09 ENCOUNTER — Encounter: Payer: Self-pay | Admitting: Vascular Surgery

## 2011-05-10 ENCOUNTER — Other Ambulatory Visit: Payer: Self-pay | Admitting: Family Medicine

## 2011-05-10 ENCOUNTER — Other Ambulatory Visit: Payer: Medicare Other

## 2011-05-10 ENCOUNTER — Encounter: Payer: Self-pay | Admitting: Vascular Surgery

## 2011-05-10 ENCOUNTER — Ambulatory Visit: Payer: Medicare Other

## 2011-05-10 ENCOUNTER — Other Ambulatory Visit: Payer: Self-pay | Admitting: *Deleted

## 2011-05-10 ENCOUNTER — Encounter (INDEPENDENT_AMBULATORY_CARE_PROVIDER_SITE_OTHER): Payer: Medicare Other | Admitting: *Deleted

## 2011-05-10 ENCOUNTER — Other Ambulatory Visit: Payer: Self-pay | Admitting: Vascular Surgery

## 2011-05-10 ENCOUNTER — Ambulatory Visit
Admission: RE | Admit: 2011-05-10 | Discharge: 2011-05-10 | Disposition: A | Discharge status: Home / Self Care | Payer: Medicare Other | Source: Ambulatory Visit | Attending: Vascular Surgery | Admitting: Vascular Surgery

## 2011-05-10 ENCOUNTER — Ambulatory Visit (INDEPENDENT_AMBULATORY_CARE_PROVIDER_SITE_OTHER): Payer: Medicare Other | Admitting: Vascular Surgery

## 2011-05-10 VITALS — BP 170/85 | HR 60 | Resp 16 | Ht 63.0 in | Wt 130.0 lb

## 2011-05-10 DIAGNOSIS — I714 Abdominal aortic aneurysm, without rupture, unspecified: Secondary | ICD-10-CM

## 2011-05-10 DIAGNOSIS — I739 Peripheral vascular disease, unspecified: Secondary | ICD-10-CM

## 2011-05-10 DIAGNOSIS — I771 Stricture of artery: Secondary | ICD-10-CM

## 2011-05-10 NOTE — Assessment & Plan Note (Signed)
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 Her symptoms have gradually progressed. She is being evaluated for aneurysm repair and this will likely be addressed at the same time. Given that she has a 5.1 cm aneurysm she is not a good candidate to have this addressed with iliac artery stents as the aneurysm we need to be addressed regardless. The stenoses and may possibly prohibit endovascular aneurysm repair. She is to undergo arteriography to further evaluate this.

## 2011-05-10 NOTE — Assessment & Plan Note (Signed)
Her aneurysm has now enlarged to 5.1 cm by CT scan. This is increased from 4.6 cm previously. I previously discussed with her that in a normal risk patient we would consider elective repair at 5.5 cm. However she feels very concerned about the aneurysm and would like to proceed with repair at this time. She apparently had a family member who had a ruptured aneurysm. Given the enlargement of the aneurysm which is now greater than 5 cm I think this is perfectly reasonable. Does have significant calcific disease in the common iliac arteries bilaterally and this may potentially prohibit her from having an endovascular repair. I recommended we proceed with arteriography in order to evaluate this further.I have reviewed with the patient the indications for arteriography. In addition, I have reviewed the potential complications of arteriography including but not limited to: Bleeding, arterial injury, arterial thrombosis, dye action, renal insufficiency, or other unpredictable medical problems. She does have a contrast allergy and is also allergic to prednisone. She be premedicated with Benadryl and Pepcid. We will also arrange for her to have preoperative cardiac evaluation by [facility] cardiology who is seen her in [place]. Once her workup is complete we can schedule for for elective repair of her 5.1 cm infrarenal abdominal aortic aneurysm.

## 2011-05-10 NOTE — Progress Notes (Signed)
[facility] [place]  Patient name: Amy Stone MRN: [id number] DOB: 1940-10-24 Sex: female  REASON FOR VISIT: follow up of abdominal aortic aneurysm.  HPI: [name] is a 71 y.o. female I last saw in April 2012 with a 4.4 cm infrarenal abdominal aortic aneurysm. She comes in for a 9 month follow up visit. Since I saw her last she's had no significant abdominal or back pain. She does state that she is very concerned about her aneurysm and wishes to pursue repair.   She also has known iliac artery occlusive disease and states her claudication symptoms have been bothering her significantly. She has claudication in both lower extremities from the hips to the feet bilaterally. Her symptoms are equal on both sides. His symptoms are brought on by ambulation and relieved with rest. She has had no rest pain or nonhealing ulcers.  Past Medical History  Diagnosis Date  . Epigastric pain   . Osteoporosis, unspecified   . Cervicalgia   . Unspecified tinnitus   . Headache   . Migraine without aura, without mention of intractable migraine without mention of status migrainosus   . Neoplasm of uncertain behavior of skin   . Thoracic aneurysm, ruptured   . Other chest pain   . Breast screening, unspecified   . Unspecified essential hypertension   . Other and unspecified hyperlipidemia   . Unspecified hypothyroidism   . Osteoarthrosis, unspecified whether generalized or localized, unspecified site   . Chronic airway obstruction, not elsewhere classified   . Coronary atherosclerosis of unspecified type of vessel, native or graft   . Peripheral vascular disease   . Cancer     Bladder  . Myocardial infarction 2007    Family History  Problem Relation Age of Onset  . Alzheimer's disease Father   . Parkinsonism Father   . Heart defect Mother     enlarged heart  . Heart attack Mother   . Hypertension Sister   . Diabetes Sister   . Heart disease Sister   .  Hyperlipidemia Sister   . Hypothyroidism Sister   . Aneurysm Paternal Grandfather     SOCIAL HISTORY: History  Substance Use Topics  . Smoking status: Former Smoker    Quit date: 11/24/2005  . Smokeless tobacco: Not on file   Comment: 40 pack years   . Alcohol Use: No    Allergies  Allergen Reactions  . Iohexol      Code: HIVES, Desc: CONTRAST REACTION OF HIVES (LARGE WHELPS DEVELOPED OVER ENTIRE BODY/MMS   . Prednisone     Current Outpatient Prescriptions  Medication Sig Dispense Refill  . aspirin 81 MG tablet Take 81 mg by mouth daily.        [name] atenolol (TENORMIN) 25 MG tablet Take 25 mg by mouth daily.        . benazepril (LOTENSIN) 10 MG tablet Take 10 mg by mouth daily.        . cyclobenzaprine (FLEXERIL) 5 MG tablet Take 5 mg by mouth. Take on twice daily for 5 days then as needed for muscle spasm       . levothyroxine (SYNTHROID, LEVOTHROID) 25 MCG tablet Take 25 mcg by mouth daily.        [name] levothyroxine (SYNTHROID, LEVOTHROID) 50 MCG tablet TAKE ONE TABLET BY MOUTH EVERY OTHER DAY  90 tablet  3  . scopolamine (TRANSDERM-SCOP) 1.5 MG Place 1 patch onto the skin every third day. Apply behind ear, change every 72  hrs.       . simvastatin (ZOCOR) 80 MG tablet TAKE ONE TABLET BY MOUTH EVERY DAY  90 tablet  2  . tiotropium (SPIRIVA) 18 MCG inhalation capsule Place 18 mcg into inhaler and inhale daily.          REVIEW OF SYSTEMS: Arly.Keller ] denotes positive finding; [  ] denotes negative finding  CARDIOVASCULAR:  [ ]  chest pain   [ ]  chest pressure   [ ]  palpitations   [ ]  orthopnea   Arly.Keller ] dyspnea on exertion   Arly.Keller ] claudication   [ ]  rest pain   [ ]  DVT   [ ]  phlebitis PULMONARY:   [ ]  productive cough   [ ]  asthma   [ ]  wheezing NEUROLOGIC:   [ ]  weakness  [ ]  paresthesias  [ ]  aphasia  [ ]  amaurosis  [ ]  dizziness HEMATOLOGIC:   [ ]  bleeding problems   [ ]  clotting disorders MUSCULOSKELETAL:  [ ]  joint pain   [ ]  joint swelling [ ]  leg swelling GASTROINTESTINAL: [ ]   blood  in stool  [ ]   hematemesis GENITOURINARY:  [ ]   dysuria  [ ]   hematuria PSYCHIATRIC:  [ ]  history of major depression INTEGUMENTARY:  [ ]  rashes  [ ]  ulcers CONSTITUTIONAL:  [ ]  fever   [ ]  chills  PHYSICAL EXAM: Filed Vitals:   05/10/11 1156  BP: 170/85  Pulse: 60  Resp: 16  Height: 5\' 3"  (1.6 m)  Weight: 130 lb (58.968 kg)  SpO2: 96%   Body mass index is 23.03 kg/(m^2). GENERAL: The patient is a well-nourished female, in no acute distress. The vital signs are documented above. CARDIOVASCULAR: There is a regular rate and rhythm without significant murmur appreciated. I do not detect carotid bruits. She has palpable femoral pulses and palpable dorsalis pedis pulses bilaterally. She has no significant lower extremity swelling. PULMONARY: There is good air exchange bilaterally without wheezing or rales. ABDOMEN: Soft and non-tender with normal pitched bowel sounds.  Her aneurysm is palpable and nontender. MUSCULOSKELETAL: There are no major deformities or cyanosis. NEUROLOGIC: No focal weakness or paresthesias are detected. SKIN: There are no ulcers or rashes noted. PSYCHIATRIC: The patient has a normal affect.  DATA:  Lab Results  Component Value Date   CREATININE 0.8 07/06/2010   I have reviewed and independently interpreted her duplex of her aorta which shows that her aneurysm has increased from 4.38 cm to 4.64 cm. She has be to stop velocity in the right iliac artery a 406 cm/s and a peak systolic velocity in the left common iliac artery 496 cm/s.  I have reviewed her CT scan which shows the aneurysm is Ashley 5.1 cm in maximum transverse diameter. It is increased in size from 4.6 cm. She has significant calcific disease at the aortic bifurcation and common iliac arteries.  MEDICAL ISSUES: ABDOMINAL AORTIC ANEURYSM Her aneurysm has now enlarged to 5.1 cm by CT scan. This is increased from 4.6 cm previously. I previously discussed with her that in a normal risk patient we would  consider elective repair at 5.5 cm. However she feels very concerned about the aneurysm and would like to proceed with repair at this time. She apparently had a family member who had a ruptured aneurysm. Given the enlargement of the aneurysm which is now greater than 5 cm I think this is perfectly reasonable. Does have significant calcific disease in the common iliac arteries bilaterally and this may potentially  prohibit her from having an endovascular repair. I recommended we proceed with arteriography in order to evaluate this further.I have reviewed with the patient the indications for arteriography. In addition, I have reviewed the potential complications of arteriography including but not limited to: Bleeding, arterial injury, arterial thrombosis, dye action, renal insufficiency, or other unpredictable medical problems. She does have a contrast allergy and is also allergic to prednisone. She be premedicated with Benadryl and Pepcid. We will also arrange for her to have preoperative cardiac evaluation by [facility] cardiology who is seen her in [place]. Once her workup is complete we can schedule for for elective repair of her 5.1 cm infrarenal abdominal aortic aneurysm.  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 Her symptoms have gradually progressed. She is being evaluated for aneurysm repair and this will likely be addressed at the same time. Given that she has a 5.1 cm aneurysm she is not a good candidate to have this addressed with iliac artery stents as the aneurysm we need to be addressed regardless. The stenoses and may possibly prohibit endovascular aneurysm repair. She is to undergo arteriography to further evaluate this.    Angellee Cohill S [facility] [place] Beeper: [phone]

## 2011-05-11 ENCOUNTER — Encounter (HOSPITAL_COMMUNITY): Payer: Self-pay | Admitting: Pharmacy Technician

## 2011-05-12 ENCOUNTER — Other Ambulatory Visit: Payer: Self-pay

## 2011-05-12 ENCOUNTER — Encounter: Payer: Self-pay | Admitting: *Deleted

## 2011-05-12 ENCOUNTER — Encounter: Payer: Self-pay | Admitting: Cardiovascular Disease

## 2011-05-12 ENCOUNTER — Ambulatory Visit: Payer: Medicare Other | Admitting: Cardiovascular Disease

## 2011-05-12 ENCOUNTER — Ambulatory Visit (INDEPENDENT_AMBULATORY_CARE_PROVIDER_SITE_OTHER): Payer: Medicare Other | Admitting: Cardiovascular Disease

## 2011-05-12 DIAGNOSIS — I2510 Atherosclerotic heart disease of native coronary artery without angina pectoris: Secondary | ICD-10-CM

## 2011-05-12 DIAGNOSIS — R079 Chest pain, unspecified: Secondary | ICD-10-CM

## 2011-05-12 DIAGNOSIS — I10 Essential (primary) hypertension: Secondary | ICD-10-CM

## 2011-05-12 DIAGNOSIS — I714 Abdominal aortic aneurysm, without rupture: Secondary | ICD-10-CM

## 2011-05-12 DIAGNOSIS — Z951 Presence of aortocoronary bypass graft: Secondary | ICD-10-CM

## 2011-05-12 DIAGNOSIS — E785 Hyperlipidemia, unspecified: Secondary | ICD-10-CM

## 2011-05-12 MED ORDER — DIPHENHYDRAMINE HCL 50 MG PO CAPS: ORAL_CAPSULE | ORAL | Status: DC

## 2011-05-12 NOTE — Assessment & Plan Note (Signed)
Blood pressure is well controlled on today's visit. No changes made to the medications. 

## 2011-05-12 NOTE — Assessment & Plan Note (Signed)
Cholesterol is at goal on the current lipid regimen. No changes to the medications were made.  

## 2011-05-12 NOTE — Assessment & Plan Note (Signed)
She is very high risk for worsening coronary artery disease.  I'm concerned about her recent episodes of chest pain, grabbing in her chest, shortness of breath.  She may also require open abdominal aortic aneurysm repair which is a high-risk procedure. We will schedule her for a stress test and she reports not having any testing since her bypass.

## 2011-05-12 NOTE — Patient Instructions (Signed)
You are doing well. No medication changes were made.  We will schedule you for a stress myoview at [facility] to evaluate your heart function prior to your AAA surgery.  Please call us if you have new issues that need to be addressed before your next appt.  Your physician wants you to follow-up in: 6 months.  You will receive a reminder letter in the mail two months in advance. If you don't receive a letter, please call our office to schedule the follow-up appointment.

## 2011-05-12 NOTE — Progress Notes (Signed)
Patient ID: Amy Stone, female    DOB: [birth year], 71 y.o.   MRN: [id number]  HPI Comments: Amy Stone is a very pleasant 71 year old woman with a history of coronary artery disease, bypass x4 in 2007, old inferior MI, abdominal aortic aneurysm measuring 5 cm as well as severe iliac arterial disease, who continues to smoke with recent episodes of chest pain, shortness of breath who presents for evaluation.  She reports that she is scheduled for a aortic and lower extremity arterial angiogram on January 28 in [place]. This will help decide whether she requires open surgical intervention on her aneurysm where she can be treated with endograft.   She reports having recent episodes of chest pain, a grabbing in her chest and some shortness of breath. She has to hold her left arm when she has these symptoms secondary to discomfort. She is uncertain if this feels similar to her previous anginal pain. She is otherwise active. She is unable to treadmill secondary to some discomfort in her legs with walking consistent with claudication. She denies any edema, orthopnea or PND.   EKG shows normal sinus rhythm with rate 60 beats per minute with no significant ST or T wave changes   Outpatient Encounter Prescriptions as of 05/12/2011  Medication Sig Dispense Refill  . aspirin EC 81 MG tablet Take 81 mg by mouth daily.      [name] atenolol (TENORMIN) 25 MG tablet Take 25 mg by mouth daily.        . benazepril (LOTENSIN) 10 MG tablet Take 10 mg by mouth daily.        [name] levothyroxine (SYNTHROID, LEVOTHROID) 50 MCG tablet Take 25 mcg by mouth daily. Takes one half (25mg ) of a 50mg  tablet daily.      . Multiple Vitamin (MULITIVITAMIN WITH MINERALS) TABS Take 1 tablet by mouth daily.      . simvastatin (ZOCOR) 80 MG tablet Take 80 mg by mouth at bedtime.      [name] tiotropium (SPIRIVA) 18 MCG inhalation capsule Place 18 mcg into inhaler and inhale daily.           Review of Systems  Constitutional: Negative.     HENT: Negative.   Eyes: Negative.   Respiratory: Positive for shortness of breath.   Cardiovascular: Positive for chest pain.  Gastrointestinal: Negative.   Musculoskeletal: Negative.        Leg pain with heavy exertion  Skin: Negative.   Neurological: Negative.   Hematological: Negative.   Psychiatric/Behavioral: Negative.   All other systems reviewed and are negative.    BP 112/62  Pulse 60  Ht 5\' 3"  (1.6 m)  Wt 130 lb 8 oz (59.194 kg)  BMI 23.12 kg/m2  Physical Exam  Nursing note and vitals reviewed. Constitutional: She is oriented to person, place, and time. She appears well-developed and well-nourished.  HENT:  Head: Normocephalic.  Nose: Nose normal.  Mouth/Throat: Oropharynx is clear and moist.  Eyes: Conjunctivae are normal. Pupils are equal, round, and reactive to light.  Neck: Normal range of motion. Neck supple. No JVD present. Carotid bruit is present.  Cardiovascular: Normal rate, regular rhythm, S1 normal, S2 normal, normal heart sounds and intact distal pulses.  Exam reveals no gallop and no friction rub.   No murmur heard. Pulses:      Carotid pulses are 1+ on the right side, and 1+ on the left side.      Radial pulses are 1+ on the right side, and 1+ on  the left side.       Femoral pulses are 1+ on the right side, and 1+ on the left side.      Dorsalis pedis pulses are 0 on the right side, and 0 on the left side.       Posterior tibial pulses are 0 on the right side, and 0 on the left side.  Pulmonary/Chest: Effort normal and breath sounds normal. No respiratory distress. She has no wheezes. She has no rales. She exhibits no tenderness.  Abdominal: Soft. Bowel sounds are normal. She exhibits no distension. There is no tenderness.  Musculoskeletal: Normal range of motion. She exhibits no edema and no tenderness.  Lymphadenopathy:    She has no cervical adenopathy.  Neurological: She is alert and oriented to person, place, and time. Coordination normal.   Skin: Skin is warm and dry. No rash noted. No erythema.  Psychiatric: She has a normal mood and affect. Her behavior is normal. Judgment and thought content normal.         Assessment and Plan

## 2011-05-12 NOTE — Assessment & Plan Note (Signed)
Angiogram scheduled in several days' time. We will perform a stress test as part of her preoperative evaluation prior to surgical intervention on her AAA.

## 2011-05-15 ENCOUNTER — Encounter (HOSPITAL_COMMUNITY)
Admission: RE | Disposition: A | Discharge status: Home / Self Care | Payer: Self-pay | Source: Ambulatory Visit | Attending: Vascular Surgery

## 2011-05-15 ENCOUNTER — Ambulatory Visit (HOSPITAL_COMMUNITY)
Admission: RE | Admit: 2011-05-15 | Discharge: 2011-05-15 | Disposition: A | Discharge status: Home / Self Care | Payer: Medicare Other | Source: Ambulatory Visit | Attending: Vascular Surgery | Admitting: Vascular Surgery

## 2011-05-15 DIAGNOSIS — I714 Abdominal aortic aneurysm, without rupture, unspecified: Secondary | ICD-10-CM

## 2011-05-15 DIAGNOSIS — I2510 Atherosclerotic heart disease of native coronary artery without angina pectoris: Secondary | ICD-10-CM | POA: Insufficient documentation

## 2011-05-15 DIAGNOSIS — I70219 Atherosclerosis of native arteries of extremities with intermittent claudication, unspecified extremity: Secondary | ICD-10-CM

## 2011-05-15 DIAGNOSIS — J4489 Other specified chronic obstructive pulmonary disease: Secondary | ICD-10-CM | POA: Insufficient documentation

## 2011-05-15 DIAGNOSIS — I10 Essential (primary) hypertension: Secondary | ICD-10-CM | POA: Insufficient documentation

## 2011-05-15 DIAGNOSIS — J449 Chronic obstructive pulmonary disease, unspecified: Secondary | ICD-10-CM | POA: Insufficient documentation

## 2011-05-15 DIAGNOSIS — E785 Hyperlipidemia, unspecified: Secondary | ICD-10-CM | POA: Insufficient documentation

## 2011-05-15 DIAGNOSIS — M810 Age-related osteoporosis without current pathological fracture: Secondary | ICD-10-CM | POA: Insufficient documentation

## 2011-05-15 HISTORY — PX: ABDOMINAL AORTAGRAM: SHX5454

## 2011-05-15 LAB — POCT I-STAT, CHEM 8
Calcium, Ion: 1.16 mmol/L (ref 1.12–1.32)
Chloride: 104 mEq/L (ref 96–112)
HCT: 44 % (ref 36.0–46.0)
Potassium: 4 mEq/L (ref 3.5–5.1)
Sodium: 138 mEq/L (ref 135–145)

## 2011-05-15 SURGERY — ABDOMINAL AORTAGRAM
Anesthesia: LOCAL

## 2011-05-15 MED ORDER — MIDAZOLAM HCL 2 MG/2ML IJ SOLN
INTRAMUSCULAR | Status: AC
  Filled 2011-05-15: qty 2

## 2011-05-15 MED ORDER — HEPARIN (PORCINE) IN NACL 2-0.9 UNIT/ML-% IJ SOLN
INTRAMUSCULAR | Status: AC
  Filled 2011-05-15: qty 1000

## 2011-05-15 MED ORDER — SODIUM CHLORIDE 0.9 % IV SOLN
INTRAVENOUS | Status: DC
  Administered 2011-05-15: 06:00:00 via INTRAVENOUS

## 2011-05-15 MED ORDER — FAMOTIDINE IN NACL 20-0.9 MG/50ML-% IV SOLN
20.0000 mg | Freq: Once | INTRAVENOUS | Status: AC
  Administered 2011-05-15: 20 mg via INTRAVENOUS
  Filled 2011-05-15: qty 50

## 2011-05-15 MED ORDER — FENTANYL CITRATE 0.05 MG/ML IJ SOLN
INTRAMUSCULAR | Status: AC
  Filled 2011-05-15: qty 2

## 2011-05-15 MED ORDER — ACETAMINOPHEN 325 MG PO TABS: 650.0000 mg | ORAL_TABLET | ORAL | Status: DC | PRN

## 2011-05-15 MED ORDER — LABETALOL HCL 5 MG/ML IV SOLN
10.0000 mg | INTRAVENOUS | Status: DC | PRN
Start: 2011-05-15 — End: 2011-05-15

## 2011-05-15 MED ORDER — LIDOCAINE HCL (PF) 1 % IJ SOLN
INTRAMUSCULAR | Status: AC
  Filled 2011-05-15: qty 30

## 2011-05-15 MED ORDER — SODIUM CHLORIDE 0.9 % IV SOLN: 1.0000 mL/kg/h | INTRAVENOUS | Status: DC

## 2011-05-15 MED ORDER — ONDANSETRON HCL 4 MG/2ML IJ SOLN: 4.0000 mg | Freq: Four times a day (QID) | INTRAMUSCULAR | Status: DC | PRN

## 2011-05-15 NOTE — Interval H&P Note (Signed)
History and Physical Interval Note:  05/15/2011 7:10 AM  Cranston Neighbor  has presented today for surgery, with the diagnosis of Triple A  The various methods of treatment have been discussed with the patient and family. After consideration of risks, benefits and other options for treatment, the patient has consented to: ABDOMINAL AORTAGRAM.  The patients' history has been reviewed, patient examined, no change in status, stable for surgery.  I have reviewed the patients' chart and labs.  Questions were answered to the patient's satisfaction.     DICKSON,CHRISTOPHER S

## 2011-05-15 NOTE — Op Note (Signed)
PATIENT: Amy Stone   MRN: [id number] DOB: Jun 01, 1940    DATE OF PROCEDURE: 05/15/2011  INDICATIONS: [name] is a 71 y.o. female with a 5.1 cm infrarenal abdominal aortic aneurysm. In addition she has bilateral proximal common iliac artery stenoses with claudication. She is brought in for elective arteriography in order to plan elective repair of her aneurysm.  PROCEDURE:  1. Ultrasound-guided access to right common femoral artery 2. Aortogram with bilateral iliac arteriogram and bilateral lower extremity runoff  SURGEON: Di Kindle. Edilia Bo, MD, FACS  ANESTHESIA: local with sedation   EBL: minimal  TECHNIQUE: the patient was taken to the PD lab and sedated with 1 mg of Versed and 50 mcg of fentanyl. Both groins were prepped and draped in the usual sterile fashion. After the skin was anesthetized with 1% lidocaine, and under ultrasound guidance, the right common femoral artery was cannulated and a guidewire introduced into the common iliac artery. Using a Kumpe catheter for support the wire was then advanced into the infrarenal aorta. A Kumpe catheter was exchanged for a pigtail catheter which was positioned at the L1 vertebral body. A flush aortogram was obtained. A lateral projection was obtained. The catheter was pulled down to above the bifurcation and oblique iliac projections were obtained. Bilateral lower extremity runoff was then obtained.  At the completion of the procedure the catheter was removed over a wire. The sheath was removed and pressure held for hemostasis. No immediate complications were noted.  FINDINGS:  1. There are single renal arteries bilaterally with no significant renal artery stenosis identified. The aneurysm appears to originate approximately 2 cm below the renal arteries. 2. There are stenoses of both proximal common iliac arteries of approximately 60-70%. 3. The external iliac arteries are small bilaterally. On the right the external iliac artery  measures 4.5 cm in maximum diameter. On the left the external iliac artery measures 4.8 cm. There is some plaque in the proximal left common iliac artery which produces a mild stenosis. 4. On the right side, the common femoral, superficial femoral, and deep femoral arteries are patent. The popliteal artery and tibial peroneal trunk are patent. The anterior tibial artery is occluded. His two-vessel runoff on the right via the peroneal and posterior tibial arteries. 5. On the left, the common femoral, deep femoral, and superficial femoral artery are patent. The popliteal artery and tibial peroneal trunk are patent. The anterior tibial artery on the left is occluded. His two-vessel runoff on the left via the peroneal and posterior tibial arteries although there is moderate diffuse disease throughout the posterior tibial artery.  Waverly Ferrari, MD, FACS [facility]  DATE OF DICTATION:   05/15/2011

## 2011-05-15 NOTE — H&P (View-Only) (Signed)
[facility]  Patient name: Amy Stone MRN: [id number] DOB: 03/06/1941 Sex: female  REASON FOR VISIT: follow up of abdominal aortic aneurysm.  HPI: Amy Stone is a 71 y.o. female I last saw in April 2012 with a 4.4 cm infrarenal abdominal aortic aneurysm. She comes in for a 9 month follow up visit. Since I saw her last she's had no significant abdominal or back pain. She does state that she is very concerned about her aneurysm and wishes to pursue repair.   She also has known iliac artery occlusive disease and states her claudication symptoms have been bothering her significantly. She has claudication in both lower extremities from the hips to the feet bilaterally. Her symptoms are equal on both sides. His symptoms are brought on by ambulation and relieved with rest. She has had no rest pain or nonhealing ulcers.  Past Medical History  Diagnosis Date  . Epigastric pain   . Osteoporosis, unspecified   . Cervicalgia   . Unspecified tinnitus   . Headache   . Migraine without aura, without mention of intractable migraine without mention of status migrainosus   . Neoplasm of uncertain behavior of skin   . Thoracic aneurysm, ruptured   . Other chest pain   . Breast screening, unspecified   . Unspecified essential hypertension   . Other and unspecified hyperlipidemia   . Unspecified hypothyroidism   . Osteoarthrosis, unspecified whether generalized or localized, unspecified site   . Chronic airway obstruction, not elsewhere classified   . Coronary atherosclerosis of unspecified type of vessel, native or graft   . Peripheral vascular disease   . Cancer     Bladder  . Myocardial infarction 2007    Family History  Problem Relation Age of Onset  . Alzheimer's disease Father   . Parkinsonism Father   . Heart defect Mother     enlarged heart  . Heart attack Mother   . Hypertension Sister   . Diabetes Sister   . Heart disease Sister   .  Hyperlipidemia Sister   . Hypothyroidism Sister   . Aneurysm Paternal Grandfather     SOCIAL HISTORY: History  Substance Use Topics  . Smoking status: Former Smoker    Quit date: 11/24/2005  . Smokeless tobacco: Not on file   Comment: 40 pack years   . Alcohol Use: No    Allergies  Allergen Reactions  . Iohexol      Code: HIVES, Desc: CONTRAST REACTION OF HIVES (LARGE WHELPS DEVELOPED OVER ENTIRE BODY/MMS   . Prednisone     Current Outpatient Prescriptions  Medication Sig Dispense Refill  . aspirin 81 MG tablet Take 81 mg by mouth daily.        . atenolol (TENORMIN) 25 MG tablet Take 25 mg by mouth daily.        . benazepril (LOTENSIN) 10 MG tablet Take 10 mg by mouth daily.        . cyclobenzaprine (FLEXERIL) 5 MG tablet Take 5 mg by mouth. Take on twice daily for 5 days then as needed for muscle spasm       . levothyroxine (SYNTHROID, LEVOTHROID) 25 MCG tablet Take 25 mcg by mouth daily.        . levothyroxine (SYNTHROID, LEVOTHROID) 50 MCG tablet TAKE ONE TABLET BY MOUTH EVERY OTHER DAY  90 tablet  3  . scopolamine (TRANSDERM-SCOP) 1.5 MG Place 1 patch onto the skin every third day. Apply behind ear, change every 72   hrs.       . simvastatin (ZOCOR) 80 MG tablet TAKE ONE TABLET BY MOUTH EVERY DAY  90 tablet  2  . tiotropium (SPIRIVA) 18 MCG inhalation capsule Place 18 mcg into inhaler and inhale daily.          REVIEW OF SYSTEMS: [X ] denotes positive finding; [  ] denotes negative finding  CARDIOVASCULAR:  [ ] chest pain   [ ] chest pressure   [ ] palpitations   [ ] orthopnea   [X ] dyspnea on exertion   [X ] claudication   [ ] rest pain   [ ] DVT   [ ] phlebitis PULMONARY:   [ ] productive cough   [ ] asthma   [ ] wheezing NEUROLOGIC:   [ ] weakness  [ ] paresthesias  [ ] aphasia  [ ] amaurosis  [ ] dizziness HEMATOLOGIC:   [ ] bleeding problems   [ ] clotting disorders MUSCULOSKELETAL:  [ ] joint pain   [ ] joint swelling [ ] leg swelling GASTROINTESTINAL: [ ]  blood  in stool  [ ]  hematemesis GENITOURINARY:  [ ]  dysuria  [ ]  hematuria PSYCHIATRIC:  [ ] history of major depression INTEGUMENTARY:  [ ] rashes  [ ] ulcers CONSTITUTIONAL:  [ ] fever   [ ] chills  PHYSICAL EXAM: Filed Vitals:   05/10/11 1156  BP: 170/85  Pulse: 60  Resp: 16  Height: 5' 3" (1.6 m)  Weight: 130 lb (58.968 kg)  SpO2: 96%   Body mass index is 23.03 kg/(m^2). GENERAL: The patient is a well-nourished female, in no acute distress. The vital signs are documented above. CARDIOVASCULAR: There is a regular rate and rhythm without significant murmur appreciated. I do not detect carotid bruits. She has palpable femoral pulses and palpable dorsalis pedis pulses bilaterally. She has no significant lower extremity swelling. PULMONARY: There is good air exchange bilaterally without wheezing or rales. ABDOMEN: Soft and non-tender with normal pitched bowel sounds.  Her aneurysm is palpable and nontender. MUSCULOSKELETAL: There are no major deformities or cyanosis. NEUROLOGIC: No focal weakness or paresthesias are detected. SKIN: There are no ulcers or rashes noted. PSYCHIATRIC: The patient has a normal affect.  DATA:  Lab Results  Component Value Date   CREATININE 0.8 07/06/2010   I have reviewed and independently interpreted her duplex of her aorta which shows that her aneurysm has increased from 4.38 cm to 4.64 cm. She has be to stop velocity in the right iliac artery a 406 cm/s and a peak systolic velocity in the left common iliac artery 496 cm/s.  I have reviewed her CT scan which shows the aneurysm is Ashley 5.1 cm in maximum transverse diameter. It is increased in size from 4.6 cm. She has significant calcific disease at the aortic bifurcation and common iliac arteries.  MEDICAL ISSUES: ABDOMINAL AORTIC ANEURYSM Her aneurysm has now enlarged to 5.1 cm by CT scan. This is increased from 4.6 cm previously. I previously discussed with her that in a normal risk patient we would  consider elective repair at 5.5 cm. However she feels very concerned about the aneurysm and would like to proceed with repair at this time. She apparently had a family member who had a ruptured aneurysm. Given the enlargement of the aneurysm which is now greater than 5 cm I think this is perfectly reasonable. Does have significant calcific disease in the common iliac arteries bilaterally and this may potentially   prohibit her from having an endovascular repair. I recommended we proceed with arteriography in order to evaluate this further.I have reviewed with the patient the indications for arteriography. In addition, I have reviewed the potential complications of arteriography including but not limited to: Bleeding, arterial injury, arterial thrombosis, dye action, renal insufficiency, or other unpredictable medical problems. She does have a contrast allergy and is also allergic to prednisone. She be premedicated with Benadryl and Pepcid. We will also arrange for her to have preoperative cardiac evaluation by [facility] who is seen her in [place]. Once her workup is complete we can schedule for for elective repair of her 5.1 cm infrarenal abdominal aortic aneurysm.  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 Her symptoms have gradually progressed. She is being evaluated for aneurysm repair and this will likely be addressed at the same time. Given that she has a 5.1 cm aneurysm she is not a good candidate to have this addressed with iliac artery stents as the aneurysm we need to be addressed regardless. The stenoses and may possibly prohibit endovascular aneurysm repair. She is to undergo arteriography to further evaluate this.    DICKSON,CHRISTOPHER S [facility] Beeper: [phone]   

## 2011-05-17 NOTE — Procedures (Unsigned)
DUPLEX ULTRASOUND OF ABDOMINAL AORTA  INDICATION:  Abdominal aortic aneurysm.  HISTORY: Bladder cancer Diabetes:  No Cardiac:  CABG, COPD Hypertension:  Yes Smoking:  Previous Connective Tissue Disorder: Family History:  No Previous Surgery:  No  DUPLEX EXAM:         AP (cm)                   TRANSVERSE (cm) Proximal             2.21 cm                   2.27 cm Mid                  2.07 cm                   2.32 cm Distal               4.10 cm                   4.64 cm Right Iliac          0.60 cm                   0.63 cm Left Iliac           0.72 cm                   0.63 cm  PREVIOUS: Date: 08/03/2010  AP:  4.36  TRANSVERSE:  4.38  IMPRESSION: 1. Stable abdominal aortic aneurysm measuring 4.0 x 4.64 cm, with     intramural thrombus present. 2. Peak systolic velocity of 406 cm/second in the right iliac artery. 3. Peak systolic velocity of 469 cm/second in the left iliac artery. 4. No significant change since prior study of 08/03/2010.  ___________________________________________ Di Kindle. Edilia Bo, M.D.  SS/MEDQ  D:  05/10/2011  T:  05/10/2011  Job:  [id number]

## 2011-05-18 ENCOUNTER — Telehealth: Payer: Self-pay | Admitting: *Deleted

## 2011-05-18 NOTE — Telephone Encounter (Signed)
Per Dr Edilia Bo these appts are adequate in timing and patient is aware.

## 2011-05-18 NOTE — Telephone Encounter (Signed)
Message copied by Oletha Blend on Thu May 18, 2011  1:39 PM ------      Message from: Cari Caraway S      Created: Wed May 17, 2011  4:29 PM      Regarding: RE: charges and schedule       That's fine.      CSD                  ----- Message -----         From: Hedy Jacob, RN         Sent: 05/17/2011  10:55 AM           To: Chuck Hint, MD      Subject: FW: charges and schedule                                 Dr Edilia Bo is this soon enough?      ----- Message -----         From: Fredrich Birks         Sent: 05/15/2011  11:53 AM           To: Hedy Jacob, RN      Subject: RE: charges and schedule                                 Pt has been set up for stress test @ Dr Ethelene Hal office on 05/25/11 and to f/u here with CSD on 06/07/11.      ----- Message -----         From: Hedy Jacob, RN         Sent: 05/15/2011  11:28 AM           To: [facility] Admin Pool      Subject: FW: charges and schedule                                 Do you know if she has an appt for cardiac clearance?If not can you set this up? And the OV with CSD      ----- Message -----         From: Chuck Hint, MD         Sent: 05/15/2011   8:02 AM           To: Rudene Anda, RN, #      Subject: charges and schedule                                     This patient had ultrasound-guided access to the right common femoral artery. She had an aortogram with bilateral iliac arteriogram and bilateral lower extremity runoff. Once she has had cardiac clearance, she'll need to be scheduled for open repair of her abdominal aortic aneurysm. I be happy to see her in the office preoperatively and she requested this. Thanks CSD

## 2011-05-25 ENCOUNTER — Ambulatory Visit: Payer: Self-pay | Admitting: Cardiovascular Disease

## 2011-05-25 DIAGNOSIS — R079 Chest pain, unspecified: Secondary | ICD-10-CM

## 2011-05-26 ENCOUNTER — Encounter: Payer: Self-pay | Admitting: Vascular Surgery

## 2011-05-26 ENCOUNTER — Telehealth: Payer: Self-pay

## 2011-05-26 NOTE — Telephone Encounter (Signed)
Notified patient per Dr. Kizzie Furnish from [facility] looked normal. Told patient she may proceed with the AAA surgery. I sent a clearance letter to Dr. Waverly Ferrari.

## 2011-06-02 ENCOUNTER — Other Ambulatory Visit: Payer: Self-pay | Admitting: Family Medicine

## 2011-06-06 ENCOUNTER — Encounter: Payer: Self-pay | Admitting: Vascular Surgery

## 2011-06-07 ENCOUNTER — Ambulatory Visit: Payer: Medicare Other | Admitting: Vascular Surgery

## 2011-06-11 ENCOUNTER — Other Ambulatory Visit: Payer: Self-pay | Admitting: Family Medicine

## 2011-06-20 ENCOUNTER — Encounter: Payer: Self-pay | Admitting: Vascular Surgery

## 2011-06-21 ENCOUNTER — Other Ambulatory Visit: Payer: Self-pay | Admitting: *Deleted

## 2011-06-21 ENCOUNTER — Encounter: Payer: Self-pay | Admitting: *Deleted

## 2011-06-21 ENCOUNTER — Encounter: Payer: Self-pay | Admitting: Vascular Surgery

## 2011-06-21 ENCOUNTER — Ambulatory Visit (INDEPENDENT_AMBULATORY_CARE_PROVIDER_SITE_OTHER): Payer: Medicare Other | Admitting: Vascular Surgery

## 2011-06-21 VITALS — BP 146/67 | HR 67 | Resp 16 | Ht 63.0 in | Wt 134.0 lb

## 2011-06-21 DIAGNOSIS — I714 Abdominal aortic aneurysm, without rupture: Secondary | ICD-10-CM

## 2011-06-21 NOTE — Progress Notes (Signed)
[facility] [place]  Patient name: Amy Stone MRN: [id number] DOB: Mar 22, 1941 Sex: female  History and Physical Exam  REASON FOR ADMISSION: 5.1 cm infrarenal abdominal aortic aneurysm and aortoiliac occlusive disease  HPI: Amy Stone is a 71 y.o. female Y. Been following with an abdominal aortic aneurysm. I saw on 05/10/2011. At that time the aneurysm had enlarged to 5.1 cm. In April of 2012 it was 4.4 cm. The aneurysm was asymptomatic however she also has known aortoiliac occlusive disease and has had bilateral lower from a claudication. She has claudication in both lower extremities which involves her hips thighs and calves bilaterally.  Her symptoms are brought on by ambulation and relieved with rest. She denies any significant rest pain or history of nonhealing ulcers.  She has undergone an arteriogram which shows she has significant diffuse iliac disease bilaterally and for this reason is not a candidate for endovascular repair of her aneurysm.    Past Medical History  Diagnosis Date  . Epigastric pain   . Osteoporosis, unspecified   . Cervicalgia   . Unspecified tinnitus   . Headache   . Migraine without aura, without mention of intractable migraine without mention of status migrainosus   . Neoplasm of uncertain behavior of skin   . Thoracic aneurysm, ruptured   . Other chest pain   . Breast screening, unspecified   . Unspecified essential hypertension   . Other and unspecified hyperlipidemia   . Unspecified hypothyroidism   . Osteoarthrosis, unspecified whether generalized or localized, unspecified site   . Chronic airway obstruction, not elsewhere classified   . Peripheral vascular disease   . Cancer     Bladder  . Myocardial infarction 2007  . Coronary atherosclerosis of unspecified type of vessel, native or graft 2007    Family History  Problem Relation Age of Onset  . Alzheimer's disease Father   . Parkinsonism Father   . Heart  defect Mother     enlarged heart  . Heart attack Mother   . Hypertension Sister   . Diabetes Sister   . Heart disease Sister   . Hyperlipidemia Sister   . Hypothyroidism Sister   . Aneurysm Paternal Grandfather     SOCIAL HISTORY: History  Substance Use Topics  . Smoking status: Former Smoker    Quit date: 11/24/2005  . Smokeless tobacco: Not on file   Comment: 40 pack years   . Alcohol Use: No    Allergies  Allergen Reactions  . Iohexol      Code: HIVES, Desc: CONTRAST REACTION OF HIVES (LARGE WHELPS DEVELOPED OVER ENTIRE BODY/MMS   . Prednisone Other (See Comments)    hallucinations    Current Outpatient Prescriptions  Medication Sig Dispense Refill  . aspirin EC 81 MG tablet Take 81 mg by mouth daily.      [name] atenolol (TENORMIN) 25 MG tablet TAKE ONE TABLET BY MOUTH EVERY DAY  90 tablet  1  . benazepril (LOTENSIN) 10 MG tablet TAKE ONE TABLET BY MOUTH EVERY DAY  90 tablet  1  . diphenhydrAMINE (BENADRYL) 50 MG capsule Take one capsule 1 hr. prior to procedure on 05/15/11 with sips of water  1 capsule  0  . levothyroxine (SYNTHROID, LEVOTHROID) 50 MCG tablet Take 25 mcg by mouth daily. Takes one half (25mg ) of a 50mg  tablet daily.      . Multiple Vitamin (MULITIVITAMIN WITH MINERALS) TABS Take 1 tablet by mouth daily.      [name]  simvastatin (ZOCOR) 80 MG tablet Take 80 mg by mouth at bedtime.      [name] SPIRIVA HANDIHALER 18 MCG inhalation capsule INHALE ONE DOSE EVERY DAY  30 each  11    REVIEW OF SYSTEMS: Arly.Keller ] denotes positive finding; [  ] denotes negative finding  CARDIOVASCULAR:  [ ]  chest pain   [ ]  chest pressure   [ ]  palpitations   [ ]  orthopnea   Arly.Keller ] dyspnea on exertion   [ ]  claudication   [ ]  rest pain   [ ]  DVT   [ ]  phlebitis PULMONARY:   [ ]  productive cough   [ ]  asthma   [ ]  wheezing NEUROLOGIC:   Arly.Keller ] weakness- Generalized  [ ]  paresthesias  [ ]  aphasia  [ ]  amaurosis  [ ]  dizziness HEMATOLOGIC:   [ ]  bleeding problems   [ ]  clotting  disorders MUSCULOSKELETAL:  [ ]  joint pain   [ ]  joint swelling [ ]  leg swelling GASTROINTESTINAL: [ ]   blood in stool  [ ]   hematemesis GENITOURINARY:  [ ]   dysuria  [ ]   hematuria PSYCHIATRIC:  [ ]  history of major depression INTEGUMENTARY:  [ ]  rashes  [ ]  ulcers CONSTITUTIONAL:  [ ]  fever   [ ]  chills  PHYSICAL EXAM: Filed Vitals:   06/21/11 1333  BP: 146/67  Pulse: 67  Resp: 16  Height: 5\' 3"  (1.6 m)  Weight: 134 lb (60.782 kg)  SpO2: 96%   Body mass index is 23.74 kg/(m^2). GENERAL: The patient is a well-nourished female, in no acute distress. The vital signs are documented above. CARDIOVASCULAR: There is a regular rate and rhythm without significant murmur appreciated. I do not detect carotid bruits. She has diminished femoral pulses bilaterally. She has a palpable left dorsalis pedis pulse. I cannot palpate pulses in the right foot. PULMONARY: There is good air exchange bilaterally without wheezing or rales. ABDOMEN: Soft and non-tender with normal pitched bowel sounds. Her aneurysm is palpable and nontender. MUSCULOSKELETAL: There are no major deformities or cyanosis. NEUROLOGIC: No focal weakness or paresthesias are detected. SKIN: There are no ulcers or rashes noted. PSYCHIATRIC: The patient has a normal affect.  DATA:  Lab Results  Component Value Date   WBC 8.0 07/06/2010   HGB 15.0 05/15/2011   HCT 44.0 05/15/2011   MCV 86.5 07/06/2010   PLT 257.0 07/06/2010   Lab Results  Component Value Date   NA 138 05/15/2011   K 4.0 05/15/2011   CL 104 05/15/2011   CO2 27 07/06/2010   Lab Results  Component Value Date   CREATININE 0.90 05/15/2011   Her angiogram shows her abdominal aortic aneurysm with a reasonable neck proximally. She has severe occlusive disease of the aortic bifurcation with diffuse common iliac and extrailiac artery disease bilaterally.  MEDICAL ISSUES: This patient has a 5.1 cm infrarenal abdominal aortic aneurysm. I've explained that generally we  would consider elective repair at 5.5 cm. Hours she feels strongly about having the aneurysm addressed at this time. She a family member who had a ruptured abdominal aortic aneurysm. Given that the aneurysm has continued to enlarge and she also has significant aortoiliac occlusive disease, I think it is reasonable to proceed with elective repair of her aneurysm at this time. She is not a candidate for endovascular repair because of her iliac disease and therefore have recommended open repair. We have discussed the indications for aneurysm repair. I have explained that the risk of rupture  without repair is approximately 5-10% per year. The patient wishes to proceed with open repair. I have discussed the potential complications of surgery, including but not limited to bleeding, renal failure, MI, wound healing problems, hernia, graft infection, embolization, or other unpredictable medical problems. I have explained that the risk of mortality or major morbidity is approximately 5-7%. All of the patients questions were answered and they are agreeable to proceed with surgery.   She has undergone preoperative cardiac evaluation and reports she has been cleared for surgery. We will obtain copies of Those records. Her surgery is scheduled for 07/13/2011.  Sigourney Portillo S [facility] [place] Beeper: [phone]

## 2011-07-05 ENCOUNTER — Encounter (HOSPITAL_COMMUNITY): Payer: Self-pay

## 2011-07-05 ENCOUNTER — Encounter (HOSPITAL_COMMUNITY)
Admission: RE | Admit: 2011-07-05 | Discharge: 2011-07-05 | Disposition: A | Discharge status: Home / Self Care | Payer: Medicare Other | Source: Ambulatory Visit | Attending: Vascular Surgery | Admitting: Vascular Surgery

## 2011-07-05 ENCOUNTER — Encounter (HOSPITAL_COMMUNITY)
Admission: RE | Admit: 2011-07-05 | Discharge: 2011-07-05 | Disposition: A | Discharge status: Home / Self Care | Payer: Medicare Other | Source: Ambulatory Visit | Attending: Anesthesiology | Admitting: Anesthesiology

## 2011-07-05 DIAGNOSIS — J438 Other emphysema: Secondary | ICD-10-CM | POA: Insufficient documentation

## 2011-07-05 DIAGNOSIS — R079 Chest pain, unspecified: Secondary | ICD-10-CM | POA: Insufficient documentation

## 2011-07-05 DIAGNOSIS — Z01818 Encounter for other preprocedural examination: Secondary | ICD-10-CM | POA: Insufficient documentation

## 2011-07-05 DIAGNOSIS — R0602 Shortness of breath: Secondary | ICD-10-CM | POA: Insufficient documentation

## 2011-07-05 HISTORY — DX: Encounter for other specified aftercare: Z51.89

## 2011-07-05 HISTORY — DX: Shortness of breath: R06.02

## 2011-07-05 HISTORY — DX: Other specified postprocedural states: R11.2

## 2011-07-05 HISTORY — DX: Other specified postprocedural states: Z98.890

## 2011-07-05 HISTORY — DX: Reserved for inherently not codable concepts without codable children: IMO0001

## 2011-07-05 HISTORY — DX: Urinary tract infection, site not specified: N39.0

## 2011-07-05 LAB — URINALYSIS, ROUTINE W REFLEX MICROSCOPIC
Glucose, UA: NEGATIVE mg/dL
Specific Gravity, Urine: 1.015 (ref 1.005–1.030)
pH: 7.5 (ref 5.0–8.0)

## 2011-07-05 LAB — COMPREHENSIVE METABOLIC PANEL
ALT: 15 U/L (ref 0–35)
AST: 21 U/L (ref 0–37)
CO2: 22 mEq/L (ref 19–32)
Calcium: 9.9 mg/dL (ref 8.4–10.5)
Chloride: 101 mEq/L (ref 96–112)
GFR calc non Af Amer: 82 mL/min — ABNORMAL LOW (ref >90)
Sodium: 135 mEq/L (ref 135–145)

## 2011-07-05 LAB — CBC
MCH: 28.9 pg (ref 26.0–34.0)
Platelets: 292 10*3/uL (ref 150–400)
RBC: 4.67 MIL/uL (ref 3.87–5.11)
WBC: 11.2 10*3/uL — ABNORMAL HIGH (ref 4.0–10.5)

## 2011-07-05 LAB — PROTIME-INR
INR: 0.95 (ref 0.00–1.49)
Prothrombin Time: 12.9 seconds (ref 11.6–15.2)

## 2011-07-05 LAB — BLOOD GAS, ARTERIAL
Drawn by: 181601
FIO2: 21 %
pCO2 arterial: 35.4 mmHg (ref 35.0–45.0)
pO2, Arterial: 86.6 mmHg (ref 80.0–100.0)

## 2011-07-05 LAB — SURGICAL PCR SCREEN
MRSA, PCR: NEGATIVE
Staphylococcus aureus: NEGATIVE

## 2011-07-05 LAB — APTT: aPTT: 29 seconds (ref 24–37)

## 2011-07-05 LAB — DIFFERENTIAL
Basophils Absolute: 0.1 10*3/uL (ref 0.0–0.1)
Eosinophils Relative: 2 % (ref 0–5)
Lymphocytes Relative: 26 % (ref 12–46)
Monocytes Absolute: 0.8 10*3/uL (ref 0.1–1.0)
Monocytes Relative: 7 % (ref 3–12)

## 2011-07-05 NOTE — Progress Notes (Signed)
Note from dr Tildon Husky [facility]  1.25.13 epic, stress 2.15.13 echo 9.07 ekg on chart from 1.13

## 2011-07-05 NOTE — Pre-Procedure Instructions (Addendum)
20 Amy Stone  07/05/2011   Your procedure is scheduled on:  3.28.13  Report to [facility] at 530 AM.  Call this number if you have problems the morning of surgery: [phone]   Remember:   Do not eat food:After Midnight.  May have clear liquids: up to 4 Hours before arrival.130 am  Clear liquids include soda, tea, black coffee, apple or grape juice, broth.  Take these medicines the morning of surgery with A SIP OF WATER: atenolol, spiriva, benadryl if needed, synthroid,aspirin unless dr said to stop               Do not wear jewelry, make-up or nail polish.  Do not wear lotions, powders, or perfumes. You may wear deodorant.  Do not shave 48 hours prior to surgery.  Do not bring valuables to the hospital.  Contacts, dentures or bridgework may not be worn into surgery.  Leave suitcase in the car. After surgery it may be brought to your room.  For patients admitted to the hospital, checkout time is 11:00 AM the day of discharge.   Patients discharged the day of surgery will not be allowed to drive home.  Name and phone number of your driver:james [phone]   Special Instructions: CHG Shower Use Special Wash: 1/2 bottle night before surgery and 1/2 bottle morning of surgery.   Please read over the following fact sheets that you were given: Pain Booklet, Coughing and Deep Breathing, Blood Transfusion Information, MRSA Information and Surgical Site Infection Prevention

## 2011-07-06 ENCOUNTER — Other Ambulatory Visit: Payer: Self-pay

## 2011-07-06 ENCOUNTER — Telehealth: Payer: Self-pay | Admitting: Vascular Surgery

## 2011-07-06 DIAGNOSIS — R0602 Shortness of breath: Secondary | ICD-10-CM

## 2011-07-06 DIAGNOSIS — R9389 Abnormal findings on diagnostic imaging of other specified body structures: Secondary | ICD-10-CM

## 2011-07-10 ENCOUNTER — Ambulatory Visit
Admission: RE | Admit: 2011-07-10 | Discharge: 2011-07-10 | Disposition: A | Discharge status: Home / Self Care | Payer: Medicare Other | Source: Ambulatory Visit | Attending: Vascular Surgery | Admitting: Vascular Surgery

## 2011-07-10 DIAGNOSIS — R0602 Shortness of breath: Secondary | ICD-10-CM

## 2011-07-10 DIAGNOSIS — R9389 Abnormal findings on diagnostic imaging of other specified body structures: Secondary | ICD-10-CM

## 2011-07-11 ENCOUNTER — Encounter: Payer: Medicare Other | Admitting: Cardiothoracic Surgery

## 2011-07-11 ENCOUNTER — Other Ambulatory Visit: Payer: Self-pay

## 2011-07-11 DIAGNOSIS — E041 Nontoxic single thyroid nodule: Secondary | ICD-10-CM

## 2011-07-13 ENCOUNTER — Encounter (HOSPITAL_COMMUNITY): Admission: RE | Payer: Self-pay | Source: Ambulatory Visit

## 2011-07-13 ENCOUNTER — Ambulatory Visit (HOSPITAL_COMMUNITY): Admission: RE | Admit: 2011-07-13 | Payer: Medicare Other | Source: Ambulatory Visit | Admitting: Vascular Surgery

## 2011-07-13 ENCOUNTER — Other Ambulatory Visit: Payer: Self-pay

## 2011-07-13 SURGERY — CREATION, BYPASS, ARTERIAL, AORTA TO FEMORAL, BILATERAL, USING GRAFT
Anesthesia: General

## 2011-07-14 ENCOUNTER — Encounter (HOSPITAL_COMMUNITY): Payer: Self-pay | Admitting: Pharmacy Technician

## 2011-07-18 ENCOUNTER — Other Ambulatory Visit: Payer: Self-pay

## 2011-07-19 ENCOUNTER — Ambulatory Visit
Admission: RE | Admit: 2011-07-19 | Discharge: 2011-07-19 | Disposition: A | Discharge status: Home / Self Care | Payer: Medicare Other | Source: Ambulatory Visit | Attending: Vascular Surgery | Admitting: Vascular Surgery

## 2011-07-19 ENCOUNTER — Other Ambulatory Visit: Payer: Self-pay

## 2011-07-19 DIAGNOSIS — E041 Nontoxic single thyroid nodule: Secondary | ICD-10-CM

## 2011-07-24 ENCOUNTER — Other Ambulatory Visit (HOSPITAL_COMMUNITY): Payer: Self-pay | Admitting: *Deleted

## 2011-07-24 ENCOUNTER — Other Ambulatory Visit: Payer: Self-pay

## 2011-07-24 ENCOUNTER — Encounter (HOSPITAL_COMMUNITY): Payer: Self-pay | Admitting: *Deleted

## 2011-07-24 MED ORDER — DEXTROSE 5 % IV SOLN
1.5000 g | INTRAVENOUS | Status: AC
  Administered 2011-07-25: 1.5 g via INTRAVENOUS
  Filled 2011-07-24: qty 1.5

## 2011-07-25 ENCOUNTER — Encounter (HOSPITAL_COMMUNITY): Payer: Self-pay | Admitting: Certified Registered Nurse Anesthetist

## 2011-07-25 ENCOUNTER — Ambulatory Visit (HOSPITAL_COMMUNITY): Payer: Medicare Other

## 2011-07-25 ENCOUNTER — Encounter (HOSPITAL_COMMUNITY): Payer: Self-pay | Admitting: Anesthesiology

## 2011-07-25 ENCOUNTER — Ambulatory Visit (HOSPITAL_COMMUNITY): Payer: Medicare Other | Admitting: Anesthesiology

## 2011-07-25 ENCOUNTER — Encounter (HOSPITAL_COMMUNITY): Payer: Self-pay | Admitting: *Deleted

## 2011-07-25 ENCOUNTER — Encounter (HOSPITAL_COMMUNITY)
Admission: RE | Disposition: A | Discharge status: Home / Self Care | Payer: Self-pay | Source: Ambulatory Visit | Attending: Vascular Surgery

## 2011-07-25 ENCOUNTER — Inpatient Hospital Stay (HOSPITAL_COMMUNITY)
Admission: RE | Admit: 2011-07-25 | Discharge: 2011-08-05 | DRG: 238 | Disposition: A | Discharge status: Home / Self Care | Payer: Medicare Other | Source: Ambulatory Visit | Attending: Vascular Surgery | Admitting: Vascular Surgery

## 2011-07-25 DIAGNOSIS — M810 Age-related osteoporosis without current pathological fracture: Secondary | ICD-10-CM | POA: Diagnosis present

## 2011-07-25 DIAGNOSIS — Z87891 Personal history of nicotine dependence: Secondary | ICD-10-CM

## 2011-07-25 DIAGNOSIS — E039 Hypothyroidism, unspecified: Secondary | ICD-10-CM | POA: Diagnosis present

## 2011-07-25 DIAGNOSIS — D62 Acute posthemorrhagic anemia: Secondary | ICD-10-CM | POA: Diagnosis not present

## 2011-07-25 DIAGNOSIS — Z951 Presence of aortocoronary bypass graft: Secondary | ICD-10-CM

## 2011-07-25 DIAGNOSIS — I7409 Other arterial embolism and thrombosis of abdominal aorta: Secondary | ICD-10-CM | POA: Diagnosis present

## 2011-07-25 DIAGNOSIS — E871 Hypo-osmolality and hyponatremia: Secondary | ICD-10-CM | POA: Diagnosis not present

## 2011-07-25 DIAGNOSIS — Z7982 Long term (current) use of aspirin: Secondary | ICD-10-CM

## 2011-07-25 DIAGNOSIS — I252 Old myocardial infarction: Secondary | ICD-10-CM

## 2011-07-25 DIAGNOSIS — Z8551 Personal history of malignant neoplasm of bladder: Secondary | ICD-10-CM

## 2011-07-25 DIAGNOSIS — I714 Abdominal aortic aneurysm, without rupture, unspecified: Principal | ICD-10-CM | POA: Diagnosis present

## 2011-07-25 DIAGNOSIS — J4489 Other specified chronic obstructive pulmonary disease: Secondary | ICD-10-CM | POA: Diagnosis present

## 2011-07-25 DIAGNOSIS — K560 Paralytic ileus: Secondary | ICD-10-CM | POA: Diagnosis present

## 2011-07-25 DIAGNOSIS — I771 Stricture of artery: Secondary | ICD-10-CM

## 2011-07-25 DIAGNOSIS — J449 Chronic obstructive pulmonary disease, unspecified: Secondary | ICD-10-CM | POA: Diagnosis present

## 2011-07-25 DIAGNOSIS — I2510 Atherosclerotic heart disease of native coronary artery without angina pectoris: Secondary | ICD-10-CM | POA: Diagnosis present

## 2011-07-25 HISTORY — PX: PR VEIN BYPASS GRAFT,AORTO-FEM-POP: 35551

## 2011-07-25 HISTORY — DX: Nontoxic single thyroid nodule: E04.1

## 2011-07-25 HISTORY — PX: AORTA - BILATERAL FEMORAL ARTERY BYPASS GRAFT: SHX1175

## 2011-07-25 LAB — POCT I-STAT 4, (NA,K, GLUC, HGB,HCT): Hemoglobin: 9.5 g/dL — ABNORMAL LOW (ref 12.0–15.0)

## 2011-07-25 LAB — POCT I-STAT 7, (LYTES, BLD GAS, ICA,H+H)
Bicarbonate: 21.9 mEq/L (ref 20.0–24.0)
HCT: 27 % — ABNORMAL LOW (ref 36.0–46.0)
Hemoglobin: 9.2 g/dL — ABNORMAL LOW (ref 12.0–15.0)
TCO2: 23 mmol/L (ref 0–100)
pCO2 arterial: 46.5 mmHg — ABNORMAL HIGH (ref 35.0–45.0)
pH, Arterial: 7.282 — ABNORMAL LOW (ref 7.350–7.400)
pO2, Arterial: 335 mmHg — ABNORMAL HIGH (ref 80.0–100.0)

## 2011-07-25 LAB — COMPREHENSIVE METABOLIC PANEL
ALT: 12 U/L (ref 0–35)
AST: 16 U/L (ref 0–37)
CO2: 20 mEq/L (ref 19–32)
Calcium: 9.4 mg/dL (ref 8.4–10.5)
Chloride: 104 mEq/L (ref 96–112)
GFR calc Af Amer: 84 mL/min — ABNORMAL LOW (ref >90)
GFR calc non Af Amer: 72 mL/min — ABNORMAL LOW (ref >90)
Glucose, Bld: 102 mg/dL — ABNORMAL HIGH (ref 70–99)
Sodium: 137 mEq/L (ref 135–145)
Total Bilirubin: 0.2 mg/dL — ABNORMAL LOW (ref 0.3–1.2)

## 2011-07-25 LAB — BLOOD GAS, ARTERIAL
Bicarbonate: 21.9 mEq/L (ref 20.0–24.0)
FIO2: 0.21 %
O2 Saturation: 96.1 %
Patient temperature: 98.6
pCO2 arterial: 33.3 mmHg — ABNORMAL LOW (ref 35.0–45.0)
pH, Arterial: 7.456 — ABNORMAL HIGH (ref 7.350–7.400)

## 2011-07-25 LAB — CBC
HCT: 34.8 % — ABNORMAL LOW (ref 36.0–46.0)
MCH: 28.6 pg (ref 26.0–34.0)
MCV: 84.5 fL (ref 78.0–100.0)
Platelets: 230 10*3/uL (ref 150–400)
Platelets: 144 10*3/uL — ABNORMAL LOW (ref 150–400)
RBC: 4.12 MIL/uL (ref 3.87–5.11)
RBC: 4.06 MIL/uL (ref 3.87–5.11)
WBC: 8.5 10*3/uL (ref 4.0–10.5)
WBC: 16.3 10*3/uL — ABNORMAL HIGH (ref 4.0–10.5)

## 2011-07-25 LAB — BASIC METABOLIC PANEL
CO2: 21 mEq/L (ref 19–32)
Calcium: 7.7 mg/dL — ABNORMAL LOW (ref 8.4–10.5)
Chloride: 107 mEq/L (ref 96–112)
Potassium: 4 mEq/L (ref 3.5–5.1)
Sodium: 135 mEq/L (ref 135–145)

## 2011-07-25 LAB — DIFFERENTIAL
Basophils Absolute: 0.1 10*3/uL (ref 0.0–0.1)
Eosinophils Relative: 2 % (ref 0–5)
Lymphocytes Relative: 25 % (ref 12–46)
Lymphs Abs: 2.2 10*3/uL (ref 0.7–4.0)
Monocytes Absolute: 0.8 10*3/uL (ref 0.1–1.0)
Neutro Abs: 5.3 10*3/uL (ref 1.7–7.7)

## 2011-07-25 LAB — APTT: aPTT: 29 seconds (ref 24–37)

## 2011-07-25 LAB — PROTIME-INR
INR: 0.95 (ref 0.00–1.49)
INR: 1.26 (ref 0.00–1.49)
Prothrombin Time: 16.1 seconds — ABNORMAL HIGH (ref 11.6–15.2)

## 2011-07-25 SURGERY — CREATION, BYPASS, ARTERIAL, AORTA TO FEMORAL, BILATERAL, USING GRAFT
Anesthesia: General | Site: Abdomen | Laterality: Bilateral | Wound class: Clean

## 2011-07-25 MED ORDER — LACTATED RINGERS IV SOLN
INTRAVENOUS | Status: DC | PRN
  Administered 2011-07-25 (×3): via INTRAVENOUS

## 2011-07-25 MED ORDER — NITROGLYCERIN IN D5W 200-5 MCG/ML-% IV SOLN
INTRAVENOUS | Status: DC | PRN
  Administered 2011-07-25: 20 ug/min via INTRAVENOUS

## 2011-07-25 MED ORDER — LIDOCAINE HCL (CARDIAC) 20 MG/ML IV SOLN
INTRAVENOUS | Status: DC | PRN
  Administered 2011-07-25: 80 mg via INTRAVENOUS

## 2011-07-25 MED ORDER — ROCURONIUM BROMIDE 100 MG/10ML IV SOLN
INTRAVENOUS | Status: DC | PRN
  Administered 2011-07-25: 50 mg via INTRAVENOUS

## 2011-07-25 MED ORDER — TEMAZEPAM 15 MG PO CAPS: 15.0000 mg | ORAL_CAPSULE | Freq: Every evening | ORAL | Status: DC | PRN

## 2011-07-25 MED ORDER — GLYCOPYRROLATE 0.2 MG/ML IJ SOLN
INTRAMUSCULAR | Status: DC | PRN
  Administered 2011-07-25: .5 mg via INTRAVENOUS

## 2011-07-25 MED ORDER — FENTANYL CITRATE 0.05 MG/ML IJ SOLN
INTRAMUSCULAR | Status: DC | PRN
  Administered 2011-07-25 (×4): 50 ug via INTRAVENOUS
  Administered 2011-07-25: 150 ug via INTRAVENOUS
  Administered 2011-07-25 (×2): 100 ug via INTRAVENOUS

## 2011-07-25 MED ORDER — ONDANSETRON HCL 4 MG/2ML IJ SOLN
4.0000 mg | Freq: Four times a day (QID) | INTRAMUSCULAR | Status: DC | PRN
  Administered 2011-07-26 – 2011-08-03 (×11): 4 mg via INTRAVENOUS
  Filled 2011-07-25 (×12): qty 2

## 2011-07-25 MED ORDER — HYDROMORPHONE HCL PF 1 MG/ML IJ SOLN: 0.2500 mg | INTRAMUSCULAR | Status: DC | PRN

## 2011-07-25 MED ORDER — HEMOSTATIC AGENTS (NO CHARGE) OPTIME
TOPICAL | Status: DC | PRN
  Administered 2011-07-25: 1

## 2011-07-25 MED ORDER — DOCUSATE SODIUM 100 MG PO CAPS
100.0000 mg | ORAL_CAPSULE | Freq: Every day | ORAL | Status: DC
  Administered 2011-07-28 – 2011-08-03 (×7): 100 mg via ORAL
  Filled 2011-07-25 (×9): qty 1

## 2011-07-25 MED ORDER — LACTATED RINGERS IV SOLN
INTRAVENOUS | Status: DC | PRN
  Administered 2011-07-25 (×2): via INTRAVENOUS

## 2011-07-25 MED ORDER — ACETAMINOPHEN 325 MG PO TABS
325.0000 mg | ORAL_TABLET | ORAL | Status: DC | PRN
  Administered 2011-08-01 – 2011-08-04 (×3): 650 mg via ORAL
  Filled 2011-07-25 (×3): qty 2

## 2011-07-25 MED ORDER — FAMOTIDINE IN NACL 20-0.9 MG/50ML-% IV SOLN
20.0000 mg | Freq: Two times a day (BID) | INTRAVENOUS | Status: DC
  Administered 2011-07-25 – 2011-08-05 (×23): 20 mg via INTRAVENOUS
  Filled 2011-07-25 (×25): qty 50

## 2011-07-25 MED ORDER — 0.9 % SODIUM CHLORIDE (POUR BTL) OPTIME
TOPICAL | Status: DC | PRN
  Administered 2011-07-25 (×3): 1000 mL

## 2011-07-25 MED ORDER — KCL IN DEXTROSE-NACL 20-5-0.45 MEQ/L-%-% IV SOLN
INTRAVENOUS | Status: DC
  Administered 2011-07-25 – 2011-07-29 (×4): via INTRAVENOUS
  Administered 2011-07-31: 75 mL via INTRAVENOUS
  Filled 2011-07-25 (×15): qty 1000

## 2011-07-25 MED ORDER — SODIUM CHLORIDE 0.9 % IV SOLN: 500.0000 mL | Freq: Once | INTRAVENOUS | Status: AC | PRN

## 2011-07-25 MED ORDER — ACETAMINOPHEN 650 MG RE SUPP: 325.0000 mg | RECTAL | Status: DC | PRN

## 2011-07-25 MED ORDER — GUAIFENESIN-DM 100-10 MG/5ML PO SYRP: 15.0000 mL | ORAL_SOLUTION | ORAL | Status: DC | PRN

## 2011-07-25 MED ORDER — ZOLPIDEM TARTRATE 5 MG PO TABS: 5.0000 mg | ORAL_TABLET | Freq: Every evening | ORAL | Status: DC | PRN

## 2011-07-25 MED ORDER — MIDAZOLAM HCL 5 MG/5ML IJ SOLN
INTRAMUSCULAR | Status: DC | PRN
  Administered 2011-07-25: 2 mg via INTRAVENOUS

## 2011-07-25 MED ORDER — LABETALOL HCL 5 MG/ML IV SOLN
INTRAVENOUS | Status: DC | PRN
  Administered 2011-07-25: 10 mg via INTRAVENOUS

## 2011-07-25 MED ORDER — METOPROLOL TARTRATE 1 MG/ML IV SOLN: 2.0000 mg | INTRAVENOUS | Status: DC | PRN

## 2011-07-25 MED ORDER — HYDRALAZINE HCL 20 MG/ML IJ SOLN
INTRAMUSCULAR | Status: DC | PRN
  Administered 2011-07-25: 10 mg via INTRAVENOUS
  Administered 2011-07-25 (×2): 5 mg via INTRAVENOUS

## 2011-07-25 MED ORDER — PROPOFOL 10 MG/ML IV EMUL
INTRAVENOUS | Status: DC | PRN
  Administered 2011-07-25: 100 mg via INTRAVENOUS

## 2011-07-25 MED ORDER — MORPHINE SULFATE 2 MG/ML IJ SOLN
2.0000 mg | INTRAMUSCULAR | Status: DC | PRN
  Administered 2011-07-25 (×2): 2 mg via INTRAVENOUS
  Administered 2011-07-25: 4 mg via INTRAVENOUS
  Administered 2011-07-25 – 2011-07-26 (×4): 2 mg via INTRAVENOUS
  Administered 2011-07-26 – 2011-07-27 (×3): 4 mg via INTRAVENOUS
  Administered 2011-07-27 (×2): 2 mg via INTRAVENOUS
  Administered 2011-07-27: 4 mg via INTRAVENOUS
  Administered 2011-07-27 – 2011-07-31 (×11): 2 mg via INTRAVENOUS
  Filled 2011-07-25 (×7): qty 1
  Filled 2011-07-25: qty 2
  Filled 2011-07-25 (×3): qty 1
  Filled 2011-07-25: qty 2
  Filled 2011-07-25 (×4): qty 1
  Filled 2011-07-25: qty 2
  Filled 2011-07-25 (×4): qty 1
  Filled 2011-07-25 (×2): qty 2
  Filled 2011-07-25 (×2): qty 1

## 2011-07-25 MED ORDER — DEXTROSE 5 % IV SOLN
1.5000 g | Freq: Two times a day (BID) | INTRAVENOUS | Status: AC
  Administered 2011-07-25 – 2011-07-26 (×2): 1.5 g via INTRAVENOUS
  Filled 2011-07-25 (×2): qty 1.5

## 2011-07-25 MED ORDER — PROTAMINE SULFATE 10 MG/ML IV SOLN
INTRAVENOUS | Status: DC | PRN
  Administered 2011-07-25: 5 mg via INTRAVENOUS
  Administered 2011-07-25 (×3): 10 mg via INTRAVENOUS

## 2011-07-25 MED ORDER — ALBUMIN HUMAN 5 % IV SOLN
INTRAVENOUS | Status: DC | PRN
  Administered 2011-07-25 (×2): via INTRAVENOUS

## 2011-07-25 MED ORDER — TIOTROPIUM BROMIDE MONOHYDRATE 18 MCG IN CAPS
18.0000 ug | ORAL_CAPSULE | RESPIRATORY_TRACT | Status: DC
  Administered 2011-07-27 – 2011-08-05 (×8): 18 ug via RESPIRATORY_TRACT
  Filled 2011-07-25 (×3): qty 5

## 2011-07-25 MED ORDER — HETASTARCH-ELECTROLYTES 6 % IV SOLN
INTRAVENOUS | Status: DC | PRN
  Administered 2011-07-25: 11:00:00 via INTRAVENOUS

## 2011-07-25 MED ORDER — SODIUM CHLORIDE 0.9 % IR SOLN
Status: DC | PRN
  Administered 2011-07-25: 09:00:00

## 2011-07-25 MED ORDER — LABETALOL HCL 5 MG/ML IV SOLN
10.0000 mg | INTRAVENOUS | Status: DC | PRN
  Filled 2011-07-25: qty 4

## 2011-07-25 MED ORDER — NEOSTIGMINE METHYLSULFATE 1 MG/ML IJ SOLN
INTRAMUSCULAR | Status: DC | PRN
  Administered 2011-07-25: 3.5 mg via INTRAVENOUS

## 2011-07-25 MED ORDER — PHENYLEPHRINE HCL 10 MG/ML IJ SOLN
10.0000 mg | INTRAVENOUS | Status: DC | PRN
  Administered 2011-07-25: 25 ug/min via INTRAVENOUS

## 2011-07-25 MED ORDER — HEPARIN SODIUM (PORCINE) 1000 UNIT/ML IJ SOLN
INTRAMUSCULAR | Status: DC | PRN
  Administered 2011-07-25: 6000 [IU] via INTRAVENOUS

## 2011-07-25 MED ORDER — VECURONIUM BROMIDE 10 MG IV SOLR
INTRAVENOUS | Status: DC | PRN
  Administered 2011-07-25: 5 mg via INTRAVENOUS
  Administered 2011-07-25: 2 mg via INTRAVENOUS

## 2011-07-25 MED ORDER — LACTATED RINGERS IV SOLN
INTRAVENOUS | Status: DC | PRN
  Administered 2011-07-25: 07:00:00 via INTRAVENOUS

## 2011-07-25 MED ORDER — OXYCODONE HCL 5 MG PO TABS
5.0000 mg | ORAL_TABLET | ORAL | Status: DC | PRN
  Administered 2011-07-28 – 2011-08-01 (×8): 10 mg via ORAL
  Administered 2011-08-02 (×2): 5 mg via ORAL
  Administered 2011-08-03 – 2011-08-04 (×4): 10 mg via ORAL
  Filled 2011-07-25 (×10): qty 2
  Filled 2011-07-25 (×2): qty 1
  Filled 2011-07-25 (×2): qty 2

## 2011-07-25 MED ORDER — ENOXAPARIN SODIUM 30 MG/0.3ML ~~LOC~~ SOLN
30.0000 mg | SUBCUTANEOUS | Status: DC
  Administered 2011-07-26 – 2011-08-04 (×9): 30 mg via SUBCUTANEOUS
  Filled 2011-07-25 (×11): qty 0.3

## 2011-07-25 MED ORDER — DOPAMINE-DEXTROSE 3.2-5 MG/ML-% IV SOLN: 3.0000 ug/kg/min | INTRAVENOUS | Status: DC | PRN

## 2011-07-25 MED ORDER — SODIUM CHLORIDE 0.9 % IV SOLN: INTRAVENOUS | Status: DC

## 2011-07-25 MED ORDER — HYDRALAZINE HCL 20 MG/ML IJ SOLN
10.0000 mg | INTRAMUSCULAR | Status: DC | PRN
  Administered 2011-07-25: 10 mg via INTRAVENOUS
  Filled 2011-07-25: qty 0.5

## 2011-07-25 MED ORDER — PHENOL 1.4 % MT LIQD: 1.0000 | OROMUCOSAL | Status: DC | PRN

## 2011-07-25 MED ORDER — POTASSIUM CHLORIDE CRYS ER 20 MEQ PO TBCR: 20.0000 meq | EXTENDED_RELEASE_TABLET | Freq: Once | ORAL | Status: AC | PRN

## 2011-07-25 SURGICAL SUPPLY — 74 items
ADH SKN CLS LQ APL DERMABOND (GAUZE/BANDAGES/DRESSINGS) ×2
CANISTER SUCTION 2500CC (MISCELLANEOUS) ×2 IMPLANT
CANNULA VESSEL W/WING WO/VALVE (CANNULA) ×2 IMPLANT
CATH EMB 3FR 80CM (CATHETERS) ×1 IMPLANT
CLIP TI MEDIUM 24 (CLIP) ×2 IMPLANT
CLIP TI WIDE RED SMALL 24 (CLIP) ×2 IMPLANT
CLOTH BEACON ORANGE TIMEOUT ST (SAFETY) ×2 IMPLANT
COVER SURGICAL LIGHT HANDLE (MISCELLANEOUS) ×4 IMPLANT
DERMABOND ADHESIVE PROPEN (GAUZE/BANDAGES/DRESSINGS) ×2
DERMABOND ADVANCED .7 DNX6 (GAUZE/BANDAGES/DRESSINGS) IMPLANT
DRAPE WARM FLUID 44X44 (DRAPE) ×2 IMPLANT
DRSG COVADERM 4X14 (GAUZE/BANDAGES/DRESSINGS) ×1 IMPLANT
DRSG COVADERM 4X6 (GAUZE/BANDAGES/DRESSINGS) ×1 IMPLANT
ELECT BLADE 4.0 EZ CLEAN MEGAD (MISCELLANEOUS) ×2
ELECT BLADE 6.5 EXT (BLADE) ×1 IMPLANT
ELECT REM PT RETURN 9FT ADLT (ELECTROSURGICAL) ×2
ELECTRODE BLDE 4.0 EZ CLN MEGD (MISCELLANEOUS) ×1 IMPLANT
ELECTRODE REM PT RTRN 9FT ADLT (ELECTROSURGICAL) ×1 IMPLANT
FELT TEFLON 4 X1 (Mesh General) ×1 IMPLANT
GAUZE SPONGE 4X4 16PLY XRAY LF (GAUZE/BANDAGES/DRESSINGS) ×1 IMPLANT
GLOVE BIO SURGEON STRL SZ 6.5 (GLOVE) ×4 IMPLANT
GLOVE BIO SURGEON STRL SZ7.5 (GLOVE) ×4 IMPLANT
GLOVE BIOGEL PI IND STRL 6.5 (GLOVE) IMPLANT
GLOVE BIOGEL PI IND STRL 7.0 (GLOVE) IMPLANT
GLOVE BIOGEL PI IND STRL 7.5 (GLOVE) ×1 IMPLANT
GLOVE BIOGEL PI INDICATOR 6.5 (GLOVE) ×1
GLOVE BIOGEL PI INDICATOR 7.0 (GLOVE) ×3
GLOVE BIOGEL PI INDICATOR 7.5 (GLOVE) ×2
GLOVE ECLIPSE 6.5 STRL STRAW (GLOVE) ×1 IMPLANT
GLOVE ORTHOPEDIC STR SZ6.5 (GLOVE) ×1 IMPLANT
GLOVE SURG SS PI 7.5 STRL IVOR (GLOVE) ×2 IMPLANT
GOWN EXTRA PROTECTION XL (GOWNS) ×3 IMPLANT
GOWN EXTRA PROTECTION XXL 0583 (GOWNS) ×1 IMPLANT
GOWN STRL NON-REIN LRG LVL3 (GOWN DISPOSABLE) ×8 IMPLANT
GRAFT HEMASHIELD 16X8MM (Vascular Products) ×1 IMPLANT
HEMOSTAT SURGICEL 2X14 (HEMOSTASIS) ×1 IMPLANT
INSERT FOGARTY 61MM (MISCELLANEOUS) ×2 IMPLANT
INSERT FOGARTY SM (MISCELLANEOUS) ×4 IMPLANT
KIT BASIN OR (CUSTOM PROCEDURE TRAY) ×2 IMPLANT
KIT ROOM TURNOVER OR (KITS) ×2 IMPLANT
LOOP VESSEL MAXI BLUE (MISCELLANEOUS) ×1 IMPLANT
LOOP VESSEL MINI RED (MISCELLANEOUS) ×1 IMPLANT
NS IRRIG 1000ML POUR BTL (IV SOLUTION) ×5 IMPLANT
PACK AORTA (CUSTOM PROCEDURE TRAY) ×2 IMPLANT
PAD ARMBOARD 7.5X6 YLW CONV (MISCELLANEOUS) ×4 IMPLANT
RETAINER VISCERA MED (MISCELLANEOUS) ×2 IMPLANT
SPECIMEN JAR MEDIUM (MISCELLANEOUS) ×2 IMPLANT
SPONGE SURGIFOAM ABS GEL 100 (HEMOSTASIS) IMPLANT
SUT ETHIBOND 5 LR DA (SUTURE) ×2 IMPLANT
SUT PDS AB 1 TP1 54 (SUTURE) ×4 IMPLANT
SUT PROLENE 2 0 MH 48 (SUTURE) IMPLANT
SUT PROLENE 3 0 SH 48 (SUTURE) ×2 IMPLANT
SUT PROLENE 3 0 SH1 36 (SUTURE) ×1 IMPLANT
SUT PROLENE 5 0 C 1 24 (SUTURE) ×2 IMPLANT
SUT PROLENE 5 0 C 1 36 (SUTURE) ×1 IMPLANT
SUT SILK 2 0 (SUTURE) ×2
SUT SILK 2 0 SH CR/8 (SUTURE) ×2 IMPLANT
SUT SILK 2 0 TIES 17X18 (SUTURE) ×2
SUT SILK 2-0 18XBRD TIE 12 (SUTURE) IMPLANT
SUT SILK 2-0 18XBRD TIE BLK (SUTURE) ×1 IMPLANT
SUT SILK 3 0 (SUTURE) ×2
SUT SILK 3 0 TIES 17X18 (SUTURE) ×2
SUT SILK 3-0 18XBRD TIE 12 (SUTURE) IMPLANT
SUT SILK 3-0 18XBRD TIE BLK (SUTURE) ×1 IMPLANT
SUT VIC AB 2-0 CTB1 (SUTURE) ×6 IMPLANT
SUT VIC AB 3-0 SH 27 (SUTURE) ×4
SUT VIC AB 3-0 SH 27X BRD (SUTURE) IMPLANT
SUT VICRYL 4-0 PS2 18IN ABS (SUTURE) ×3 IMPLANT
SYR TB 1ML LUER SLIP (SYRINGE) ×1 IMPLANT
TOWEL BLUE STERILE X RAY DET (MISCELLANEOUS) ×4 IMPLANT
TOWEL OR 17X24 6PK STRL BLUE (TOWEL DISPOSABLE) ×4 IMPLANT
TOWEL OR 17X26 10 PK STRL BLUE (TOWEL DISPOSABLE) ×4 IMPLANT
TRAY FOLEY CATH 14FRSI W/METER (CATHETERS) ×2 IMPLANT
WATER STERILE IRR 1000ML POUR (IV SOLUTION) ×4 IMPLANT

## 2011-07-25 NOTE — Op Note (Signed)
NAME: [name]   MRN: [id number] DOB: May 02, 1940    DATE OF OPERATION: 07/25/2011  PREOP DIAGNOSIS:  1. Abdominal aortic aneurysm 2. Aortoiliac occlusive disease  POSTOP DIAGNOSIS: same  PROCEDURE: Repair of abdominal aortic aneurysm with aortobifemoral bypass graft (16 x 8 mm Dacron graft)  SURGEON: Di Kindle. Edilia Bo, MD, FACS  ASSIST: Fabienne Bruns M.D., [facility]  ANESTHESIA: Gen.   EBL: per anesthesia record  INDICATIONS: Amy Stone is a 71 y.o. female who I have been following with an abdominal aortic aneurysm. The aneurysm enlarged from 4.4 cm to 5.1 cm and she wished to pursue elective repair. In addition she had significant aortoiliac occlusive disease with disabling claudication.  FINDINGS: significantly calcific arteries. No other intra-abdominal pathology noted.  TECHNIQUE: The patient was brought to the operating room and received a general anesthetic. Arterial line and monitoring lines had been placed by anesthesia. The abdomen and groins were prepped and draped in the usual sterile fashion. On the left side an incision was made over the common femoral artery and through this incision the common femoral artery, deep femoral artery, and superficial femoral arteries were dissected free and controlled with vessel loops. A retroperitoneal tunnel was started on the left. The artery was soft with minimal plaque noted. Likewise, on the right side, a longitudinal incision was made in the right groin. Through this incision the common femoral, deep femoral, and superficial femoral arteries were dissected free and controlled with vessel loops. A retroperitoneal tunnel started on the right. Next he abdomen was entered through a midline incision. There were minimal adhesions. The transverse colon was reflected superiorly and the small bowel reflected to the right. On exploratory laparotomy no other intra-abdominal pathology was noted. Direct perineal tissue was  divided overlying the aneurysm. The dissection was carried up to the left renal vein. Proximally the artery was dissected free circumferentially and a umbilical tape placed. There were some large lumbar veins posterior to this. The dissection was then carried distally. The common iliac arteries bilaterally were controlled with Tevdek sutures. The retroperitoneal tunnels were completed on both sides and umbilical tapes were passed. The patient was then heparinized. The common iliac arteries were ligated bilaterally after the heparin circulated with 2 Tevdek sutures. Proximal aorta was clamped. The aneurysm was opened and lumbars were oversewn with 2-0 silk ties. Proximally the aorta was divided circumferentially. There was some posterior plaque. A 16 x 8 graft was selected and cut to the appropriate length. It was sewn into and to the infrarenal aorta with a felt cuff. The proximal anastomosis was tested and was hemostatic. The graft was flushed. The aorta was then reclamped. The right limb of the graft was brought down for anastomosis to the right common femoral artery.  On the right side, the common femoral, superficial femoral, and deep femoral arteries were controlled. A longitudinal arteriotomy was made in the common femoral artery. The right limb the graft was cut to the appropriate length and spatulated and sewn end to side to the common femoral artery using continuous 5-0 Prolene suture. Prior to completing this anastomosis the artery was backbled and flushed properly and the anastomosis completed. Closure established to the right leg which the patient tolerated from a hemodynamic standpoint.  On the left side, the common femoral, superficial femoral, and deep femoral arteries were clamped. A longitudinal arteriotomy was made in the common femoral artery. The left limb the graft was cut to the appropriate length spatulated and sewn end to  side to the common femoral artery using continuous 5-0 Prolene  suture. Prior to completing this anastomosis the artery was backbled and flushed appropriately and the anastomosis completed. Closure established of the left leg which the patient tolerated from a hemodynamic standpoint. Next attention was turned to the abdomen. There was some bleeding from the inferior mesenteric artery with and this was oversewn with 2-0 silk tie.  The heparin was partially reversed with protamine. Hemostasis was obtained in both groins. Both groins were closed with a deep layer of 2-0 Vicryl. The subcutaneous layer of 3-0 Vicryl and the skin closed with 4-0 subcuticular stitch. The abdomen was irrigated and hemostasis obtained. The aneurysm was closed over the graft with running 2-0 Vicryl. The retroperitoneal tissue was closed with running 2-0 Vicryl. The abdominal contents were returned to their normal position. The fascia was closed with running 2-0 PDS. The subcutaneous layer was closed with 2 running 3-0 Vicryl then the skin was closed with 2 running 4-0 Vicryl's. Sterile dressings were applied.  The patient remained hemodynamic stable. She had palpable dorsalis pedis pulses. All needle and sponge counts were correct. The patient tolerated the procedure well and was transferred to the recovery room in stable condition.  Waverly Ferrari, MD, FACS [facility]  DATE OF DICTATION:   07/25/2011

## 2011-07-25 NOTE — Preoperative (Signed)
Beta Blockers   Reason not to administer Beta Blockers:Not Applicable 

## 2011-07-25 NOTE — Anesthesia Preprocedure Evaluation (Addendum)
Anesthesia Evaluation  Patient identified by MRN, date of birth, ID band Patient awake    Reviewed: Allergy & Precautions, H&P , NPO status , Patient's Chart, lab work & pertinent test results, reviewed documented beta blocker date and time   History of Anesthesia Complications (+) PONV  Airway Mallampati: II TM Distance: >3 FB Neck ROM: Full    Dental No notable dental hx. (+) Edentulous Upper, Edentulous Lower and Dental Advisory Given   Pulmonary neg pulmonary ROS, shortness of breath, COPD COPD inhaler, former smoker breath sounds clear to auscultation  Pulmonary exam normal       Cardiovascular hypertension, Pt. on medications and Pt. on home beta blockers + CAD, + Past MI, + CABG and + Peripheral Vascular Disease negative cardio ROS  Rhythm:Regular Rate:Normal  Abdominal aneurysm 5.1 cm infrarenals involved. EF 65% on Echo from 2007   Neuro/Psych  Headaches, negative neurological ROS  negative psych ROS   GI/Hepatic negative GI ROS, Neg liver ROS,   Endo/Other  negative endocrine ROSHypothyroidism   Renal/GU negative Renal ROS  negative genitourinary   Musculoskeletal  (+) Arthritis -, Osteoarthritis,    Abdominal   Peds  Hematology negative hematology ROS (+)   Anesthesia Other Findings   Reproductive/Obstetrics negative OB ROS                       Anesthesia Physical Anesthesia Plan  ASA: III  Anesthesia Plan: General   Post-op Pain Management:    Induction: Intravenous  Airway Management Planned: Oral ETT  Additional Equipment: Ultrasound Guidance Line Placement, PA Cath, CVP and Arterial line  Intra-op Plan:   Post-operative Plan: Extubation in OR and Possible Post-op intubation/ventilation  Informed Consent: I have reviewed the patients History and Physical, chart, labs and discussed the procedure including the risks, benefits and alternatives for the proposed  anesthesia with the patient or authorized representative who has indicated his/her understanding and acceptance.   Dental advisory given  Plan Discussed with: CRNA  Anesthesia Plan Comments:         Anesthesia Quick Evaluation

## 2011-07-25 NOTE — Anesthesia Postprocedure Evaluation (Signed)
  Anesthesia Post-op Note  Patient: Amy Stone  Procedure(s) Performed: Procedure(s) (LRB): AORTA BIFEMORAL BYPASS GRAFT (Bilateral)  Patient Location: PACU  Anesthesia Type: General  Level of Consciousness: awake  Airway and Oxygen Therapy: Patient Spontanous Breathing and Patient connected to face mask oxygen  Post-op Pain: mild  Post-op Assessment: Post-op Vital signs reviewed, Patient's Cardiovascular Status Stable, Respiratory Function Stable and Patent Airway  Post-op Vital Signs: Reviewed and stable  Complications: No apparent anesthesia complications

## 2011-07-25 NOTE — Progress Notes (Signed)
VASCULAR PROGRESS NOTE  SUBJECTIVE: Pain adequately controlled  PHYSICAL EXAM: Filed Vitals:   07/25/11 1329 07/25/11 1330 07/25/11 1400 07/25/11 1500  BP:    143/61  Pulse:  98 99 101  Temp: 97 F (36.1 C)  96.8 F (36 C) 97 F (36.1 C)  TempSrc:      Resp:  16 15 14   Height:      Weight:      SpO2:  94% 95% 96%   Lungs: clear to auscultation Palpable DP pulses  LABS: Lab Results  Component Value Date   WBC 16.3* 07/25/2011   HGB 11.6* 07/25/2011   HCT 34.0* 07/25/2011   MCV 83.7 07/25/2011   PLT 144* 07/25/2011   Lab Results  Component Value Date   CREATININE 0.63 07/25/2011   Lab Results  Component Value Date   INR 1.26 07/25/2011   CBG (last 3)  No results found for this basename: GLUCAP:3 in the last 72 hours   ASSESSMENT/PLAN: 1. Doing well post op.  Waverly Ferrari, MD, FACS Beeper: [phone] 07/25/2011

## 2011-07-25 NOTE — Anesthesia Procedure Notes (Addendum)
Procedure Name: Intubation Date/Time: 07/25/2011 7:53 AM Performed by: Christel Mormon Pre-anesthesia Checklist: Patient identified, Emergency Drugs available, Suction available, Patient being monitored and Timeout performed Patient Re-evaluated:Patient Re-evaluated prior to inductionOxygen Delivery Method: Circle system utilized Preoxygenation: Pre-oxygenation with 100% oxygen Intubation Type: IV induction Ventilation: Mask ventilation without difficulty Laryngoscope Size: Mac and 3 Grade View: Grade I Tube type: Oral Tube size: 7.5 mm Number of attempts: 1 Airway Equipment and Method: Stylet Placement Confirmation: ETT inserted through vocal cords under direct vision,  positive ETCO2 and breath sounds checked- equal and bilateral Secured at: 20 cm Tube secured with: Tape Dental Injury: Teeth and Oropharynx as per pre-operative assessment

## 2011-07-25 NOTE — H&P (Signed)
[facility] [place]   Patient name: Amy Stone  MRN: [id number]  DOB: 1940/06/02    History and Physical Exam   REASON FOR ADMISSION: 5.1 cm infrarenal abdominal aortic aneurysm and aortoiliac occlusive disease   HPI:   Amy Stone is a 71 y.o. female who I have been following with an abdominal aortic aneurysm. I saw on 05/10/2011. At that time the aneurysm had enlarged to 5.1 cm. In April of 2012 it was 4.4 cm. The aneurysm was asymptomatic however she also has known aortoiliac occlusive disease and has had bilateral lower from a claudication. She has claudication in both lower extremities which involves her hips thighs and calves bilaterally. Her symptoms are brought on by ambulation and relieved with rest. She denies any significant rest pain or history of nonhealing ulcers.   She has undergone an arteriogram which shows she has significant diffuse iliac disease bilaterally and for this reason is not a candidate for endovascular repair of her aneurysm.   Her preoperative chest x-ray suggested Emphysema with a new 2 cm rounded density in the right apex concerning for neoplasm. CT scan was recommended. She underwent CT scan which showed: biapical pleural parenchymal scarring. No additional findings to account for the questioned abnormality on recent CXR. Also noted was a dominant right thyroid nodule. And thyroid ultrasound was recommended. This showed:  Right thyroid lobe: 24 x 32 x 62 mm, inhomogeneous  Left thyroid lobe: Surgically absent  Isthmus: 5 mm in thickness  Focal nodules: 13 x 20 x 20 mm complex mostly solid, mid isthmus  19 x 28 x 38 mm complex, mid-right  6 x10 x 11 mm solid, superior right  2 x 8 x 9 mm hypoechoic solid, superficial mid-right  4 x 5 x 6 mm hypoechoic, inferior right  Lymphadenopathy: None visualized.   IMPRESSION:  1. Isthmic and right thyroid lesions as above. The dominant  isthmic and right lobe lesions meet consensus  criteria for biopsy.  Ultrasound-guided fine needle aspiration should be considered.  2. Changes of left hemithyroidectomy without evident residual or  recurrent tissue.    Past Medical History   Diagnosis  Date   .  Epigastric pain    .  Osteoporosis, unspecified    .  Cervicalgia    .  Unspecified tinnitus    .  Headache    .  Migraine without aura, without mention of intractable migraine without mention of status migrainosus    .  Neoplasm of uncertain behavior of skin    .  Thoracic aneurysm, ruptured    .  Other chest pain    .  Breast screening, unspecified    .  Unspecified essential hypertension    .  Other and unspecified hyperlipidemia    .  Unspecified hypothyroidism    .  Osteoarthrosis, unspecified whether generalized or localized, unspecified site    .  Chronic airway obstruction, not elsewhere classified    .  Peripheral vascular disease    .  Cancer      Bladder   .  Myocardial infarction  2007   .  Coronary atherosclerosis of unspecified type of vessel, native or graft  2007    Family History   Problem  Relation  Age of Onset   .  Alzheimer's disease  Father    .  Parkinsonism  Father    .  Heart defect  Mother       enlarged heart    .  Heart attack  Mother    .  Hypertension  Sister    .  Diabetes  Sister    .  Heart disease  Sister    .  Hyperlipidemia  Sister    .  Hypothyroidism  Sister    .  Aneurysm  Paternal Grandfather     SOCIAL HISTORY:  History   Substance Use Topics   .  Smoking status:  Former Smoker     Quit date:  11/24/2005   .  Smokeless tobacco:  Not on file     Comment: 40 pack years    .  Alcohol Use:  No    Allergies   Allergen  Reactions   .  Iohexol      Code: HIVES, Desc: CONTRAST REACTION OF HIVES (LARGE WHELPS DEVELOPED OVER ENTIRE BODY/MMS   .  Prednisone  Other (See Comments)     hallucinations    Current Outpatient Prescriptions   Medication  Sig  Dispense  Refill   .  aspirin EC 81 MG tablet  Take 81 mg  by mouth daily.     [name]  atenolol (TENORMIN) 25 MG tablet  TAKE ONE TABLET BY MOUTH EVERY DAY  90 tablet  1   .  benazepril (LOTENSIN) 10 MG tablet  TAKE ONE TABLET BY MOUTH EVERY DAY  90 tablet  1   .  diphenhydrAMINE (BENADRYL) 50 MG capsule  Take one capsule 1 hr. prior to procedure on 05/15/11 with sips of water  1 capsule  0   .  levothyroxine (SYNTHROID, LEVOTHROID) 50 MCG tablet  Take 25 mcg by mouth daily. Takes one half (25mg ) of a 50mg  tablet daily.     .  Multiple Vitamin (MULITIVITAMIN WITH MINERALS) TABS  Take 1 tablet by mouth daily.     .  simvastatin (ZOCOR) 80 MG tablet  Take 80 mg by mouth at bedtime.     [name]  SPIRIVA HANDIHALER 18 MCG inhalation capsule  INHALE ONE DOSE EVERY DAY  30 each  11    REVIEW OF SYSTEMS: Arly.Keller ] denotes positive finding; [ ]  denotes negative finding  CARDIOVASCULAR: [ ]  chest pain [ ]  chest pressure [ ]  palpitations [ ]  orthopnea  Arly.Keller ] dyspnea on exertion [ ]  claudication [ ]  rest pain [ ]  DVT [ ]  phlebitis  PULMONARY: [ ]  productive cough [ ]  asthma [ ]  wheezing  NEUROLOGIC: Arly.Keller ] weakness- Generalized [ ]  paresthesias [ ]  aphasia [ ]  amaurosis [ ]  dizziness  HEMATOLOGIC: [ ]  bleeding problems [ ]  clotting disorders  MUSCULOSKELETAL: [ ]  joint pain [ ]  joint swelling [ ]  leg swelling  GASTROINTESTINAL: [ ]  blood in stool [ ]  hematemesis  GENITOURINARY: [ ]  dysuria [ ]  hematuria  PSYCHIATRIC: [ ]  history of major depression  INTEGUMENTARY: [ ]  rashes [ ]  ulcers  CONSTITUTIONAL: [ ]  fever [ ]  chills   PHYSICAL EXAM:  Filed Vitals:   07/25/11 0545  BP: 139/77  Pulse: 61  Temp: 98.2 F (36.8 C)  Resp: 18                             Body mass index is 23.74 kg/(m^2).  GENERAL: The patient is a well-nourished female, in no acute distress. The vital signs are documented above.  CARDIOVASCULAR: There is a regular rate and rhythm without significant murmur appreciated. I do not detect carotid bruits. She has diminished femoral pulses  bilaterally. She has a palpable left dorsalis pedis pulse. I cannot palpate pulses in the right foot.  PULMONARY: There is good air exchange bilaterally without wheezing or rales.  ABDOMEN: Soft and non-tender with normal pitched bowel sounds. Her aneurysm is palpable and nontender.  MUSCULOSKELETAL: There are no major deformities or cyanosis.  NEUROLOGIC: No focal weakness or paresthesias are detected.  SKIN: There are no ulcers or rashes noted.  PSYCHIATRIC: The patient has a normal affect.   DATA:  Lab Results   Component  Value  Date    WBC  8.0  07/06/2010    HGB  15.0  05/15/2011    HCT  44.0  05/15/2011    MCV  86.5  07/06/2010    PLT  257.0  07/06/2010    Lab Results   Component  Value  Date    NA  138  05/15/2011    K  4.0  05/15/2011    CL  104  05/15/2011    CO2  27  07/06/2010    Lab Results   Component  Value  Date    CREATININE  0.90  05/15/2011    Her angiogram shows her abdominal aortic aneurysm with a reasonable neck proximally. She has severe occlusive disease of the aortic bifurcation with diffuse common iliac and extrailiac artery disease bilaterally.   MEDICAL ISSUES:  This patient has a 5.1 cm infrarenal abdominal aortic aneurysm. I've explained that generally we would consider elective repair at 5.5 cm. Hours she feels strongly about having the aneurysm addressed at this time. She a family member who had a ruptured abdominal aortic aneurysm. Given that the aneurysm has continued to enlarge and she also has significant aortoiliac occlusive disease, I think it is reasonable to proceed with elective repair of her aneurysm at this time. She is not a candidate for endovascular repair because of her iliac disease and therefore have recommended open repair. We have discussed the indications for aneurysm repair. I have explained that the risk of rupture without repair is approximately 5-10% per year. The patient wishes to proceed with open repair. I have discussed the potential  complications of surgery, including but not limited to bleeding, renal failure, MI, wound healing problems, hernia, graft infection, embolization, or other unpredictable medical problems. I have explained that the risk of mortality or major morbidity is approximately 5-7%. All of the patients questions were answered and they are agreeable to proceed with surgery.   She has undergone preoperative cardiac evaluation and reports she has been cleared for surgery. We will obtain copies of those records.  Kennedi Lizardo S  [facility] [place]  Beeper: [phone]

## 2011-07-25 NOTE — Transfer of Care (Signed)
Immediate Anesthesia Transfer of Care Note  Patient: Amy Stone  Procedure(s) Performed: Procedure(s) (LRB): AORTA BIFEMORAL BYPASS GRAFT (Bilateral)  Patient Location: PACU  Anesthesia Type: General  Level of Consciousness: sedated  Airway & Oxygen Therapy: Patient Spontanous Breathing and Patient connected to face mask oxygen  Post-op Assessment: Report given to PACU RN, Post -op Vital signs reviewed and stable and Patient moving all extremities  Post vital signs: Reviewed and stable  Complications: No apparent anesthesia complications

## 2011-07-26 ENCOUNTER — Encounter (HOSPITAL_COMMUNITY): Payer: Self-pay | Admitting: Vascular Surgery

## 2011-07-26 ENCOUNTER — Inpatient Hospital Stay (HOSPITAL_COMMUNITY): Payer: Medicare Other

## 2011-07-26 LAB — COMPREHENSIVE METABOLIC PANEL
ALT: 11 U/L (ref 0–35)
AST: 22 U/L (ref 0–37)
Calcium: 7.8 mg/dL — ABNORMAL LOW (ref 8.4–10.5)
Creatinine, Ser: 0.59 mg/dL (ref 0.50–1.10)
GFR calc Af Amer: >90 mL/min (ref >90)
Glucose, Bld: 168 mg/dL — ABNORMAL HIGH (ref 70–99)
Sodium: 131 mEq/L — ABNORMAL LOW (ref 135–145)
Total Protein: 5.1 g/dL — ABNORMAL LOW (ref 6.0–8.3)

## 2011-07-26 LAB — AMYLASE: Amylase: 98 U/L (ref 0–105)

## 2011-07-26 LAB — TYPE AND SCREEN
ABO/RH(D): A NEG
Unit division: 0

## 2011-07-26 LAB — GLUCOSE, CAPILLARY: Glucose-Capillary: 147 mg/dL — ABNORMAL HIGH (ref 70–99)

## 2011-07-26 LAB — CBC
MCH: 28.5 pg (ref 26.0–34.0)
MCHC: 34 g/dL (ref 30.0–36.0)
Platelets: 164 10*3/uL (ref 150–400)

## 2011-07-26 MED ORDER — CHLORHEXIDINE GLUCONATE 0.12 % MT SOLN
15.0000 mL | Freq: Two times a day (BID) | OROMUCOSAL | Status: DC
  Administered 2011-07-26 – 2011-08-04 (×11): 15 mL via OROMUCOSAL
  Filled 2011-07-26 (×22): qty 15

## 2011-07-26 MED ORDER — METOPROLOL TARTRATE 1 MG/ML IV SOLN
5.0000 mg | Freq: Four times a day (QID) | INTRAVENOUS | Status: DC
  Administered 2011-07-26 – 2011-08-05 (×39): 5 mg via INTRAVENOUS
  Filled 2011-07-26 (×47): qty 5

## 2011-07-26 MED ORDER — BIOTENE DRY MOUTH MT LIQD
15.0000 mL | Freq: Two times a day (BID) | OROMUCOSAL | Status: DC
  Administered 2011-07-27 – 2011-07-29 (×5): 15 mL via OROMUCOSAL

## 2011-07-26 NOTE — Progress Notes (Signed)
VASCULAR PROGRESS NOTE  SUBJECTIVE: Some confusion last PM. Moderate discomfort.  PHYSICAL EXAM: Filed Vitals:   07/26/11 0500 07/26/11 0600 07/26/11 0700 07/26/11 0734  BP: 154/67 133/73 162/63   Pulse: 101 97 94   Temp: 99 F (37.2 C) 99 F (37.2 C) 98.8 F (37.1 C) 98.8 F (37.1 C)  TempSrc:  Core (Comment)  Core (Comment)  Resp: 14 17 13    Height:      Weight:      SpO2: 97% 97% 98%    Min drainage from NGT.  Lungs: clear to auscultation Abd: no bowel sounds. Palpable DP pulses.  LABS: Lab Results  Component Value Date   WBC 18.9* 07/26/2011   HGB 12.7 07/26/2011   HCT 37.3 07/26/2011   MCV 83.6 07/26/2011   PLT 164 07/26/2011   Lab Results  Component Value Date   CREATININE 0.59 07/26/2011   Lab Results  Component Value Date   INR 1.26 07/25/2011   CBG (last 3)     ASSESSMENT/PLAN: 1. Cardiac: hemodynamically stable. 2. Renal function normal 3. Pulm: IS. OOB 4. Transfer to 3300.  5. Will need ultrasound guided biopsy of thyroid nodule prior to D/C. (Probably Thursday or Friday) 6. DVT prophylaxis: Lovenox  Waverly Ferrari, MD, FACS Beeper: [phone] 07/26/2011

## 2011-07-26 NOTE — Progress Notes (Signed)
UR COMPLETED  

## 2011-07-26 NOTE — Evaluation (Signed)
Physical Therapy Evaluation Patient Details Name: Amy Stone MRN: [id number] DOB: [birth year] Today's Date: 07/26/2011  Problem List:  Patient Active Problem List  Diagnoses  . HYPOTHYROIDISM  . HYPERLIPIDEMIA  . COMMON MIGRAINE  . HYPERTENSION  . CAD  . ABDOMINAL AORTIC ANEURYSM  . CHRONIC OBSTRUCTIVE PULMONARY DISEASE, MODERATE  . OSTEOARTHRITIS  . OSTEOPOROSIS  . SK (seborrheic keratosis)  . Atypical chest pain  . Iliac artery stenosis, bilateral    Past Medical History:  Past Medical History  Diagnosis Date  . Epigastric pain   . Osteoporosis, unspecified   . Cervicalgia   . Unspecified tinnitus   . Neoplasm of uncertain behavior of skin   . Thoracic aneurysm, ruptured   . Other chest pain   . Breast screening, unspecified   . Unspecified essential hypertension   . Other and unspecified hyperlipidemia   . Unspecified hypothyroidism   . Osteoarthrosis, unspecified whether generalized or localized, unspecified site   . Chronic airway obstruction, not elsewhere classified   . Peripheral vascular disease   . Myocardial infarction 2007  . Coronary atherosclerosis of unspecified type of vessel, native or graft 2007  . PONV (postoperative nausea and vomiting)   . Shortness of breath   . Blood transfusion   . UTI (lower urinary tract infection)   . Cancer     Bladder  . Thyroid nodule     seen on CT Scan 07/19/2011   Past Surgical History:  Past Surgical History  Procedure Date  . Acute inferior mi 11/2005  . Port wine stain removal w/ laser 2004    bladder cancer  . Goiter removal 1979  . Coronary artery bypass graft 11/2005  . Abdominal hysterectomy   . Aorta - bilateral femoral artery bypass graft 07/25/2011    Procedure: AORTA BIFEMORAL BYPASS GRAFT;  Surgeon: Chuck Hint, MD;  Location: [facility] OR;  Service: Vascular;  Laterality: Bilateral;    PT Assessment/Plan/Recommendation PT Assessment Clinical Impression Statement: pt presents s/p AAA Repair  and CABG.  pt limited by pain and nausea, but moving well.  Anticipate once pain and nausea controlled pt should make great progress.   PT Recommendation/Assessment: Patient will need skilled PT in the acute care venue PT Problem List: Decreased activity tolerance;Decreased balance;Decreased mobility;Decreased knowledge of use of DME;Pain Barriers to Discharge: None PT Therapy Diagnosis : Difficulty walking;Acute pain PT Plan PT Frequency: Min 3X/week PT Treatment/Interventions: DME instruction;Gait training;Stair training;Functional mobility training;Therapeutic activities;Therapeutic exercise;Balance training;Patient/family education PT Recommendation Follow Up Recommendations: Home health PT;Supervision - Intermittent Equipment Recommended:  (TBD) PT Goals  Acute Rehab PT Goals PT Goal Formulation: With patient Time For Goal Achievement: 2 weeks Pt will Roll Supine to Right Side: with modified independence PT Goal: Rolling Supine to Right Side - Progress: Goal set today Pt will Roll Supine to Left Side: with modified independence PT Goal: Rolling Supine to Left Side - Progress: Goal set today Pt will go Supine/Side to Sit: with modified independence PT Goal: Supine/Side to Sit - Progress: Goal set today Pt will go Sit to Supine/Side: with modified independence PT Goal: Sit to Supine/Side - Progress: Goal set today Pt will go Sit to Stand: with modified independence PT Goal: Sit to Stand - Progress: Goal set today Pt will go Stand to Sit: with modified independence PT Goal: Stand to Sit - Progress: Goal set today Pt will Ambulate: >150 feet;with modified independence;with rolling walker PT Goal: Ambulate - Progress: Goal set today Pt will Go Up / Down Stairs:  3-5 stairs;with min assist;with rail(s) PT Goal: Up/Down Stairs - Progress: Goal set today  PT Evaluation Precautions/Restrictions  Precautions Precautions: Fall Restrictions Weight Bearing Restrictions: No Prior  Functioning  Home Living Lives With: Spouse Receives Help From: Family Type of Home: House Home Layout: One level Home Access: Stairs to enter Entrance Stairs-Rails:  (pt unsure) Entrance Stairs-Number of Steps: 3 Bathroom Shower/Tub: Forensic scientist: Standard Bathroom Accessibility: No Home Adaptive Equipment: None Prior Function Level of Independence: Independent with basic ADLs;Independent with homemaking with ambulation;Independent with gait;Independent with transfers Able to Take Stairs?: Yes Driving: No 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 Level: Oriented to person;Oriented to place Sensation/Coordination Sensation Light Touch: Appears Intact Extremity Assessment RUE Assessment RUE Assessment: Within Functional Limits LUE Assessment LUE Assessment: Within Functional Limits RLE Assessment RLE Assessment: Within Functional Limits LLE Assessment LLE Assessment: Within Functional Limits Mobility (including Balance) Bed Mobility Bed Mobility: Yes Supine to Sit: 1: +2 Total assist;HOB elevated (Comment degrees) (pt=70%  HOB at ~20degrees) Supine to Sit Details (indicate cue type and reason): pt held to therapist's arm to assist  Sitting - Scoot to Edge of Bed: 4: Min assist Sitting - Scoot to [place] of Bed Details (indicate cue type and reason): used pad to A.  pt limited by pain.   Transfers Transfers: Yes Sit to Stand: 1: +2 Total assist;Patient percentage (comment);With upper extremity assist;From bed (pt 70%) Sit to Stand Details (indicate cue type and reason): cues for use of UEs, anterior wt shift.  pt kept one UE holding blanket over incision to splint for pain control.   Stand to Sit: 1: +2 Total assist;Patient percentage (comment);With upper extremity assist;To chair/3-in-1;With armrests (pt 70%) Stand to Sit Details: cues to get closer to chair, control descent Stand Pivot Transfers: 1: +2 Total  assist;Patient percentage (comment) (pt 70%) Stand Pivot Transfer Details (indicate cue type and reason): small pivotal steps to chair.  pt hold blanket to incision to splint against pain.   Ambulation/Gait Ambulation/Gait: No Stairs: No Wheelchair Mobility Wheelchair Mobility: No  Posture/Postural Control Posture/Postural Control: No significant limitations Balance Balance Assessed: No Exercise    End of Session PT - End of Session Equipment Utilized During Treatment:  (None) Activity Tolerance: Patient limited by pain Patient left: in chair;with call bell in reach Nurse Communication: Mobility status for transfers General Behavior During Session: [facility] for tasks performed Cognition: Impaired Cognitive Impairment: pt lethargic and slow to respond.    Sunny Schlein, [place] [phone] 07/26/2011, 3:05 PM

## 2011-07-26 NOTE — Evaluation (Signed)
Occupational Therapy Evaluation Patient Details Name: Amy Stone MRN: [id number] DOB: 06-Sep-1940 Today's Date: 07/26/2011  Problem List:  Patient Active Problem List  Diagnoses  . HYPOTHYROIDISM  . HYPERLIPIDEMIA  . COMMON MIGRAINE  . HYPERTENSION  . CAD  . ABDOMINAL AORTIC ANEURYSM  . CHRONIC OBSTRUCTIVE PULMONARY DISEASE, MODERATE  . OSTEOARTHRITIS  . OSTEOPOROSIS  . SK (seborrheic keratosis)  . Atypical chest pain  . Iliac artery stenosis, bilateral    Past Medical History:  Past Medical History  Diagnosis Date  . Epigastric pain   . Osteoporosis, unspecified   . Cervicalgia   . Unspecified tinnitus   . Neoplasm of uncertain behavior of skin   . Thoracic aneurysm, ruptured   . Other chest pain   . Breast screening, unspecified   . Unspecified essential hypertension   . Other and unspecified hyperlipidemia   . Unspecified hypothyroidism   . Osteoarthrosis, unspecified whether generalized or localized, unspecified site   . Chronic airway obstruction, not elsewhere classified   . Peripheral vascular disease   . Myocardial infarction 2007  . Coronary atherosclerosis of unspecified type of vessel, native or graft 2007  . PONV (postoperative nausea and vomiting)   . Shortness of breath   . Blood transfusion   . UTI (lower urinary tract infection)   . Cancer     Bladder  . Thyroid nodule     seen on CT Scan 07/19/2011   Past Surgical History:  Past Surgical History  Procedure Date  . Acute inferior mi 11/2005  . Port wine stain removal w/ laser 2004    bladder cancer  . Goiter removal 1979  . Coronary artery bypass graft 11/2005  . Abdominal hysterectomy   . Aorta - bilateral femoral artery bypass graft 07/25/2011    Procedure: AORTA BIFEMORAL BYPASS GRAFT;  Surgeon: Chuck Hint, MD;  Location: [facility] OR;  Service: Vascular;  Laterality: Bilateral;    OT Assessment/Plan/Recommendation OT Assessment Clinical Impression Statement: Pt admitted repair of  abdominal aortic aneurysm with aortobifemoral bypass graft. Pt presents with below problem list with main limitation at this time is pain. Will benefit from skilled OT in the acute setting to maximize I with ADL and ADL mobility to Mod I-S upon d/c OT Recommendation/Assessment: Patient will need skilled OT in the acute care venue OT Problem List: Decreased activity tolerance;Impaired balance (sitting and/or standing);Decreased knowledge of use of DME or AE;Cardiopulmonary status limiting activity;Pain OT Therapy Diagnosis : Generalized weakness;Acute pain OT Plan OT Frequency: Min 2X/week OT Treatment/Interventions: Self-care/ADL training;DME and/or AE instruction;Therapeutic activities;Patient/family education;Balance training OT Recommendation Follow Up Recommendations: Home health OT;Supervision/Assistance - 24 hour Equipment Recommended:  (TBD) Individuals Consulted Consulted and Agree with Results and Recommendations: Patient OT Goals Acute Rehab OT Goals OT Goal Formulation: With patient Time For Goal Achievement: 7 days ADL Goals Pt Will Perform Grooming: with modified independence;Standing at sink ADL Goal: Grooming - Progress: Goal set today Pt Will Perform Upper Body Bathing: with set-up;with supervision;Sitting at sink;Sitting in shower;Sitting, chair ADL Goal: Upper Body Bathing - Progress: Goal set today Pt Will Perform Lower Body Bathing: with set-up;with supervision;Sit to stand from chair;Sit to stand from bed;Sit to stand in shower ADL Goal: Lower Body Bathing - Progress: Goal set today Pt Will Perform Upper Body Dressing: with set-up;Sitting, chair;Sitting, bed ADL Goal: Upper Body Dressing - Progress: Goal set today Pt Will Perform Lower Body Dressing: with supervision;Sit to stand from chair;Sit to stand from bed ADL Goal: Lower Body Dressing - Progress:  Goal set today Pt Will Transfer to Toilet: with modified independence;Ambulation;with DME;3-in-1 ADL Goal: Toilet  Transfer - Progress: Goal set today Pt Will Perform Toileting - Clothing Manipulation: Independently;Standing ADL Goal: Toileting - Clothing Manipulation - Progress: Goal set today Pt Will Perform Tub/Shower Transfer: Tub transfer;with supervision;with DME;Ambulation ADL Goal: Tub/Shower Transfer - Progress: Goal set today  OT Evaluation Precautions/Restrictions  Precautions Precautions: Fall Restrictions Weight Bearing Restrictions: No Prior Functioning Home Living Lives With: Spouse Receives Help From: Family Type of Home: House Home Layout: One level Home Access: Stairs to enter Entrance Stairs-Rails:  (pt unsure) Entrance Stairs-Number of Steps: 3 Bathroom Shower/Tub: Forensic scientist: Standard Bathroom Accessibility: No Home Adaptive Equipment: None Prior Function Level of Independence: Independent with basic ADLs;Independent with homemaking with ambulation;Independent with gait;Independent with transfers Able to Take Stairs?: Yes Driving: No Vocation: Retired ADL ADL Eating/Feeding: Not assessed Grooming: Minimal assistance;Wash/dry face Grooming Details (indicate cue type and reason): assist secondary to abdominal pain Where Assessed - Grooming: Sitting, chair Upper Body Bathing: Simulated;Maximal assistance Where Assessed - Upper Body Bathing: Sitting, chair Lower Body Bathing: Simulated;+1 Total assistance Where Assessed - Lower Body Bathing: Sit to stand from bed Upper Body Dressing: Simulated;Maximal assistance Where Assessed - Upper Body Dressing: Sitting, bed Lower Body Dressing: Simulated;+1 Total assistance Where Assessed - Lower Body Dressing: Sit to stand from bed Toilet Transfer: Simulated;+2 Total assistance (pt=75%) Toilet Transfer Details (indicate cue type and reason): simulated EOB to chair Toilet Transfer Method: Stand pivot Tub/Shower Transfer: Not assessed Ambulation Related to ADLs: Not attempted today secondary to pain  and nausea ADL Comments: Pt greatly limited by pain Cognition Cognition Arousal/Alertness: Lethargic Orientation Level: Oriented to person;Oriented to place Sensation/Coordination Sensation Light Touch: Appears Intact Extremity Assessment RUE Assessment RUE Assessment: Within Functional Limits LUE Assessment LUE Assessment: Within Functional Limits Mobility  Bed Mobility Bed Mobility: Yes Supine to Sit: 1: +2 Total assist;HOB elevated (Comment degrees) (pt=70%  HOB at ~20degrees) Supine to Sit Details (indicate cue type and reason): pt held to therapist's arm to assist  Sitting - Scoot to Edge of Bed: 4: Min assist Sitting - Scoot to [place] of Bed Details (indicate cue type and reason): used pad to A.  pt limited by pain.   Transfers Sit to Stand: 1: +2 Total assist;Patient percentage (comment);With upper extremity assist;From bed (pt 70%) Sit to Stand Details (indicate cue type and reason): cues for use of UEs, anterior wt shift.  pt kept one UE holding blanket over incision to splint for pain control.   Stand to Sit: 1: +2 Total assist;Patient percentage (comment);With upper extremity assist;To chair/3-in-1;With armrests (pt 70%) Stand to Sit Details: cues to get closer to chair, control descent End of Session OT - End of Session Activity Tolerance: Patient limited by fatigue;Patient limited by pain Patient left: in chair;with call bell in reach Nurse Communication: Mobility status for transfers;Mobility status for ambulation General Behavior During Session: [facility] for tasks performed Cognition: Impaired   Thau Kaman 07/26/2011, 2:55 PM

## 2011-07-27 MED ORDER — KETOROLAC TROMETHAMINE 30 MG/ML IJ SOLN
INTRAMUSCULAR | Status: AC
  Filled 2011-07-27: qty 1

## 2011-07-27 MED ORDER — KETOROLAC TROMETHAMINE 15 MG/ML IJ SOLN
15.0000 mg | Freq: Four times a day (QID) | INTRAMUSCULAR | Status: DC | PRN
  Administered 2011-07-27 – 2011-07-28 (×2): 15 mg via INTRAVENOUS
  Filled 2011-07-27 (×2): qty 1

## 2011-07-27 NOTE — Progress Notes (Signed)
D:  Transferred to 3314 A:  Via Wheelchair with propak and O2.  R:  Transferred without distress noted.  Tolerated transfer well.

## 2011-07-27 NOTE — Clinical Documentation Improvement (Signed)
Anemia Blood Loss Clarification  THIS DOCUMENT IS NOT A PERMANENT PART OF THE MEDICAL RECORD         07/27/11  Dr. Edilia Bo and/or Associates,  In an effort to better capture your patient's severity of illness, reflect appropriate length of stay and utilization of resources, a review of the patient medical record has revealed the following indicators:  Preop H&H       13.5/39.1  Lowest H&H postop     9.2/27.0  Total Blood Products received on 07/25/11 - 975 ml  350 ml prbcs infused at 0958  350 ml prbcs infused at 1055 275 ml from cell saver infused at 1100  Repeat blood draws to trend H&H results      Based on your clinical judgment, please document in the progress notes and discharge summary if a condition below provides greater specificity regarding the administration of blood products postoperatively:   - Expected Acute Blood Loss Anemia   - Other Condition   - Unable to Clinically Determine  In responding to this query please exercise your independent judgment.  The fact that a query is asked, does not imply that any particular answer is desired or expected.  Reviewed: abla documented by dr. Edilia Bo in pn 07/28/11.  Mathis Dad RN  Thank You,  Jerral Ralph  RN BSN Certified Clinical Documentation Specialist: Cell   [phone]  Health Information Management [place]   RESPOND TO THE THIS QUERY, FOLLOW THE INSTRUCTIONS BELOW:  1. If needed, update documentation for the patient's encounter via the notes activity.  2. Access this query again and click edit on the In [employer].  3. After updating, or not, click F2 to complete all highlighted (required) fields concerning your review. Select "additional documentation in the medical record" OR "no additional documentation provided".  4. Click Sign note button.  5. The deficiency will fall out of your In Basket *Please let us know if you are not able to complete this workflow by phone or e-mail (listed  below).

## 2011-07-27 NOTE — Progress Notes (Signed)
VASCULAR PROGRESS NOTE  SUBJECTIVE: A little drowsy. Mild nausea. No vomiting.  PHYSICAL EXAM: Filed Vitals:   07/27/11 0400 07/27/11 0500 07/27/11 0600 07/27/11 0700  BP: 143/63 149/68 135/59 125/68  Pulse: 91 96 95 83  Temp: 98.7 F (37.1 C)     TempSrc: Oral     Resp: 12 14 13 15   Height:      Weight:   132 lb 4.4 oz (60 kg)   SpO2: 96% 92% 97% 95%   Lungs: clear to auscultation Abd: no bowel sounds Palpable DP pulses  CBG (last 3)   Basename 07/26/11 0736  GLUCAP 147*     ASSESSMENT/PLAN: 1. 2 Days Post-Op s/p: Repair of AAA/ AFBG 2. D/C Foley 3. Still waiting on 3300 bed. 4. Ambulate 5. Will try Toradol to help with pain.   Waverly Ferrari, MD, FACS Beeper: [phone] 07/27/2011

## 2011-07-27 NOTE — Progress Notes (Signed)
Physical Therapy Treatment Patient Details Name: KENNESHA BREWBAKER MRN: [id number] DOB: 1940/07/23 Today's Date: 07/27/2011  PT Assessment/Plan  PT - Assessment/Plan Comments on Treatment Session: Patient s/p AAA/ AFBG with decr mobility secondary to pain and weakness.  Will benefit from PT to address endurance and balance issues. PT Plan: Discharge plan remains appropriate;Frequency remains appropriate PT Frequency: Min 3X/week Follow Up Recommendations: Home health PT;Supervision - Intermittent Equipment Recommended: None recommended by PT PT Goals  Acute Rehab PT Goals PT Goal: Sit to Supine/Side - Progress: Progressing toward goal PT Goal: Sit to Stand - Progress: Progressing toward goal PT Goal: Stand to Sit - Progress: Progressing toward goal PT Goal: Ambulate - Progress: Progressing toward goal  PT Treatment Precautions/Restrictions  Precautions Precautions: Fall Restrictions Weight Bearing Restrictions: No Mobility (including Balance) Bed Mobility Supine to Sit: Not tested (comment) Sitting - Scoot to Edge of Bed: Not tested (comment) Sit to Supine: 3: Mod assist;HOB flat Sit to Supine - Details (indicate cue type and reason): for LES Transfers Sit to Stand: 4: Min assist;With upper extremity assist;From chair/3-in-1;With armrests Sit to Stand Details (indicate cue type and reason): cues for UE placement Stand to Sit: 4: Min assist;With upper extremity assist;To chair/3-in-1;With armrests Stand to Sit Details: cues to get closer to chair, control descent Stand Pivot Transfers: Not tested (comment) Ambulation/Gait Ambulation/Gait: Yes Ambulation/Gait Assistance: 4: Min assist Ambulation/Gait Assistance Details (indicate cue type and reason): Ambulated with RW with cues for safety with RW Ambulation Distance (Feet): 45 Feet Assistive device: Rolling walker Gait Pattern: Step-through pattern;Decreased stride length;Shuffle Stairs: No Wheelchair Mobility Wheelchair  Mobility: No  Posture/Postural Control Posture/Postural Control: No significant limitations   End of Session PT - End of Session Equipment Utilized During Treatment: Gait belt Activity Tolerance: Patient limited by fatigue;Patient limited by pain Patient left: in chair;with call bell in reach Nurse Communication: Mobility status for transfers;Mobility status for ambulation General Behavior During Session: [facility] for tasks performed Cognition: Impaired  INGOLD,Odena Mcquaid 07/27/2011, 1:07 PM   [facility] [phone] (pager)

## 2011-07-27 NOTE — Plan of Care (Signed)
Problem: Phase I Progression Outcomes Goal: Vascular site scale level 0 - I Vascular Site Scale Level 0: No bruising/bleeding/hematoma Level I (Mild): Bruising/Ecchymosis, minimal bleeding/ooozing, palpable hematoma < 3 cm Level II (Moderate): Bleeding not affecting hemodynamic parameters, pseudoaneurysm, palpable hematoma > 3 cm Outcome: Progressing Level 0     

## 2011-07-28 MED FILL — Heparin Sodium (Porcine) Inj 1000 Unit/ML: INTRAMUSCULAR | Qty: 30 | Status: AC

## 2011-07-28 MED FILL — Sodium Chloride IV Soln 0.9%: INTRAVENOUS | Qty: 2000 | Status: AC

## 2011-07-28 NOTE — Progress Notes (Signed)
Occupational Therapy Treatment Patient Details Name: Amy Stone MRN: [id number] DOB: [birth year] Today's Date: 07/28/2011  OT Assessment/Plan OT Assessment/Plan Comments on Treatment Session: Pt progressing well, but limited today by nausea with activity OT Plan: Discharge plan remains appropriate Follow Up Recommendations: Home health OT;Supervision/Assistance - 24 hour Equipment Recommended: None recommended by OT OT Goals ADL Goals ADL Goal: Grooming - Progress: Progressing toward goals ADL Goal: Lower Body Dressing - Progress: Progressing toward goals ADL Goal: Toilet Transfer - Progress: Progressing toward goals ADL Goal: Toileting - Clothing Manipulation - Progress: Progressing toward goals  OT Treatment Precautions/Restrictions  Precautions Precautions: Fall Restrictions Weight Bearing Restrictions: No   ADL ADL Grooming: Performed;Supervision/safety Where Assessed - Grooming: Standing at sink Lower Body Dressing: Performed;Minimal assistance Lower Body Dressing Details (indicate cue type and reason): doff/don socks Where Assessed - Lower Body Dressing: Sitting, chair Toilet Transfer: Performed;Supervision/safety Toilet Transfer Details (indicate cue type and reason): sit to stand from toilet Toilet Transfer Equipment: Regular height toilet;Grab bars Toileting - Clothing Manipulation: Performed;Supervision/safety Where Assessed - Glass blower/designer Manipulation: Standing Toileting - Hygiene: Not assessed Equipment Used: Rolling walker Ambulation Related to ADLs: Supervision with RW ambulation chair to/from bathroom. VC to stay within RW as pt with tendency to allow RW to get out in front of her.  ADL Comments: Pt progressing well, but limited today by nausea with ambulation Mobility  Bed Mobility Supine to Sit: 4: Min assist;HOB flat Supine to Sit Details (indicate cue type and reason): pt pulled on therapist's hands instead of using rail despite cueing Sitting  - Scoot to Edge of Bed: 5: Supervision Transfers Sit to Stand: 5: Supervision;From bed;From toilet;With upper extremity assist Stand to Sit: 5: Supervision;With armrests;To chair/3-in-1;To toilet Stand to Sit Details: VC for hand placement  End of Session OT - End of Session Activity Tolerance: Patient limited by pain (and nausea) Patient left: in chair;with call bell in reach Nurse Communication: Mobility status for transfers;Mobility status for ambulation General Behavior During Session: [facility] for tasks performed Cognition: [facility] for tasks performed  Lorriane Dehart  07/28/2011, 1:34 PM

## 2011-07-28 NOTE — Progress Notes (Signed)
VASCULAR PROGRESS NOTE  SUBJECTIVE: Some flatus. She has been ambulating some.  PHYSICAL EXAM: Filed Vitals:   07/27/11 1705 07/27/11 2000 07/28/11 0000 07/28/11 0415  BP: 128/99 139/76 134/56 135/56  Pulse: 92 94 93 91  Temp:  99.2 F (37.3 C) 99.4 F (37.4 C) 98.7 F (37.1 C)  TempSrc:  Oral Oral Oral  Resp: 14 15 16 14   Height:      Weight:      SpO2: 93% 93% 93% 92%   Lungs: clear to auscultation Abdomen: Normal pitched bowel sounds. Incisions look good. Palpable dorsalis pedis pulses.  ASSESSMENT/PLAN: 1. 3 Days Post-Op s/p: repair of abdominal aortic aneurysm with aortobifemoral bypass graft. 2. GI: The patient has normal pitch bowel sounds and has passed some flatus. We'll start diet slowly area 3. Acute blood loss anemia: Preop hemoglobin was 13.5. Lowest hemoglobin postop was 9.2. She received 2 units of blood intraoperatively and 275 cc of Cell Saver. Her hemoglobin now is 12.7 and stable. 4. Preoperatively, the patient had a thyroid ultrasound. There were thyroid nodules noted in the isthmus and right thyroid lobe. It was noted that the dominant isthmus and right lobe lesions met the consensus criteria for biopsy. Ultrasound-guided fine-needle aspiration was recommended. We will try to obtain that while she is in the hospital. 5. Ambulate 6. Transferred to 2000. 7. Home when tolerating diet and pain well controlled po pain meds.  Amy Ferrari, MD, FACS Beeper: [phone] 07/28/2011

## 2011-07-28 NOTE — Progress Notes (Addendum)
Report called to [facility].  Pt transferred to 2022 via wheelchair with all belongings.  Attempted to call husband with no response.      Roselie Awkward, RN

## 2011-07-28 NOTE — Progress Notes (Signed)
Patient ID: Amy Stone, female   DOB: [birth year], 70 y.o.   MRN: [id number] Request received for US guided biopsy of dominant right thyroid/isthmus nodules. Hx noted, imaging studies reviewed by Dr. Lowella Dandy. Dominant right thyroid and isthmus nodules are amenable to FNA. Details/risks of procedure d/w pt with her understanding and consent. Pt received lovenox injection today, therefore biopsy will tentatively be scheduled for 4/15. If pt is discharged prior to 4/15 please notify our service at [phone] so that arrangements can be made for biopsy to be done as OP at our IR clinic([phone]).

## 2011-07-29 LAB — CBC
MCH: 28 pg (ref 26.0–34.0)
MCV: 84.4 fL (ref 78.0–100.0)
Platelets: 196 10*3/uL (ref 150–400)
RDW: 14.9 % (ref 11.5–15.5)
WBC: 7.3 10*3/uL (ref 4.0–10.5)

## 2011-07-29 MED ORDER — BISACODYL 10 MG RE SUPP
10.0000 mg | Freq: Every day | RECTAL | Status: DC | PRN
  Administered 2011-07-29: 10 mg via RECTAL
  Filled 2011-07-29: qty 1

## 2011-07-29 MED ORDER — PROMETHAZINE HCL 25 MG/ML IJ SOLN
12.5000 mg | Freq: Once | INTRAMUSCULAR | Status: DC
  Filled 2011-07-29: qty 1

## 2011-07-29 NOTE — Progress Notes (Addendum)
[facility] [place]  Post-op  Intra-abdominal Surgery note  Date of Surgery: 07/25/2011 Surgeon: Surgeon(s): Chuck Hint, MD Sherren Kerns, MD POD: 4 Days Post-Op Procedure(s): AORTA BIFEMORAL BYPASS GRAFT  History of Present Illness  SIENNAH BARRASSO is a 71 y.o. female who is  up s/p Procedure(s): AORTA BIFEMORAL BYPASS GRAFT Pt is doing well. denies incisional pain; denies nausea/vomiting; denies diarrhea. has had flatus;has not had BM  IMAGING: No results found.  Significant Diagnostic Studies: CBC Lab Results  Component Value Date   WBC 7.3 07/29/2011   HGB 11.1* 07/29/2011   HCT 33.5* 07/29/2011   MCV 84.4 07/29/2011   PLT 196 07/29/2011    BMET    Component Value Date/Time   NA 131* 07/26/2011 0404   K 3.8 07/26/2011 0404   CL 100 07/26/2011 0404   CO2 23 07/26/2011 0404   GLUCOSE 168* 07/26/2011 0404   BUN 8 07/26/2011 0404   CREATININE 0.59 07/26/2011 0404   CALCIUM 7.8* 07/26/2011 0404   GFRNONAA >90 07/26/2011 0404   GFRAA >90 07/26/2011 0404    COAG Lab Results  Component Value Date   INR 1.26 07/25/2011   INR 0.95 07/25/2011   INR 0.95 07/05/2011   No results found for this basename: PTT    I/O last 3 completed shifts: In: 1905 [P.O.:480; I.V.:1275; IV Piggyback:150] Out: 1250 [Urine:1250]    Physical Examination BP Readings from Last 3 Encounters:  07/29/11 124/70  07/29/11 124/70  07/05/11 146/78   Temp Readings from Last 3 Encounters:  07/29/11 97.8 F (36.6 C) Oral  07/29/11 97.8 F (36.6 C) Oral  07/05/11 96.9 F (36.1 C) Oral   SpO2 Readings from Last 3 Encounters:  07/29/11 94%  07/29/11 94%  07/05/11 95%   Pulse Readings from Last 3 Encounters:  07/29/11 96  07/29/11 96  07/05/11 60    General: A&O x 3, WDWN female in NAD Pulmonary: normal non-labored breathing , without Rales, rhonchi,  wheezing Cardiac: Heart rate : regular ,  Abdomen:non-tender Abdominal wound:clean, dry, intact  Neurologic:  A&O X 3; Appropriate Affect ; SENSATION: normal; MOTOR FUNCTION:  moving all extremities equally. Speech is fluent/normal  Vascular Exam:BLE warm and well perfused Extremities without ischemic changes, no Gangrene , no cellulitis; no open wounds;   LOWER EXTREMITY PULSES           RIGHT                                      LEFT      POSTERIOR TIBIAL present 1+ and palpable palpable       DORSALIS PEDIS      ANTERIOR TIBIAL palpable palpable       PERONEAL palpable palpable     Assessment/Plan: JOLEIGH MINEAU is a 71 y.o. female who is 4 Days Post-Op Procedure(s): AORTA BIFEMORAL BYPASS GRAFT  Clear liquids will advance to reg as tolerates in am  Home when tolerating diet and pain well controlled po pain meds   Mosetta Pigeon [phone] 07/29/2011 8:37 AM  Positive flatus Abdomen distended, still nauseated Incision ok Palpable pulses Needs to ambulate.  Not taking po, will restart IVF at 75 If continues to have post op ileus, may ned CT scan and possibly NG tube Recheck labs in am  Wells Meshia Rau

## 2011-07-29 NOTE — Progress Notes (Signed)
07/29/2011 3:49 PM Nursing Note Patient refused phenergan iv. Waste witnessed by [employer] in sharps.  Audray Rumore, Blanchard Kelch

## 2011-07-30 LAB — BASIC METABOLIC PANEL
Calcium: 8.4 mg/dL (ref 8.4–10.5)
Chloride: 100 mEq/L (ref 96–112)
Creatinine, Ser: 0.6 mg/dL (ref 0.50–1.10)
GFR calc Af Amer: >90 mL/min (ref >90)

## 2011-07-30 LAB — CBC
Platelets: 242 10*3/uL (ref 150–400)
RDW: 14.8 % (ref 11.5–15.5)
WBC: 8.6 10*3/uL (ref 4.0–10.5)

## 2011-07-30 NOTE — Progress Notes (Signed)
07/30/2011 2:32 PM Nursing note Patient currently refusing ambulation due to extreme pain/nausea. PRN medications have been given as ordered.  Education provided to patient and family regarding importance of ambulation and progression. Will re-attempt ambulation with patient later this evening.  Floyed Masoud, Blanchard Kelch

## 2011-07-30 NOTE — Progress Notes (Signed)
07/30/2011 6:13 PM Nursing note Patient ambulated with staff member 100 ft with rn, rolling walker and on ra. Pt. Did not tolerate walk well, suffering from nausea/vomitiing during ambulation. Relieved with rest. Will continue to monitor.  Hosteen Kienast, Blanchard Kelch

## 2011-07-30 NOTE — Progress Notes (Signed)
[facility] of   Subjective  - POD #5  Positive flatus and formed stool Still c/o nausea with PO intake Pain adequately controlled   Physical Exam:  Ecchymosis on all incisions abd soft but slightly distended Palpable DP bilaterally       Assessment/Plan:  POD #5  Resolving post-op ileus.  Labs normal today. Needs to be oob and ambulating   Lakeyshia Tuckerman IV, V. WELLS 07/30/2011 9:01 AM --  Filed Vitals:   07/30/11 0446  BP: 137/78  Pulse: 87  Temp: 97.4 F (36.3 C)  Resp: 16    Intake/Output Summary (Last 24 hours) at 07/30/11 0901 Last data filed at 07/30/11 0852  Gross per 24 hour  Intake      0 ml  Output    400 ml  Net   -400 ml     Laboratory CBC    Component Value Date/Time   WBC 8.6 07/30/2011 0810   HGB 11.8* 07/30/2011 0810   HCT 34.9* 07/30/2011 0810   PLT 242 07/30/2011 0810    BMET    Component Value Date/Time   NA 131* 07/26/2011 0404   K 3.8 07/26/2011 0404   CL 100 07/26/2011 0404   CO2 23 07/26/2011 0404   GLUCOSE 168* 07/26/2011 0404   BUN 8 07/26/2011 0404   CREATININE 0.59 07/26/2011 0404   CALCIUM 7.8* 07/26/2011 0404   GFRNONAA >90 07/26/2011 0404   GFRAA >90 07/26/2011 0404    COAG Lab Results  Component Value Date   INR 1.26 07/25/2011   INR 0.95 07/25/2011   INR 0.95 07/05/2011   No results found for this basename: PTT    Antibiotics Anti-infectives     Start     Dose/Rate Route Frequency Ordered Stop   07/25/11 1900   cefUROXime (ZINACEF) 1.5 g in dextrose 5 % 50 mL IVPB        1.5 g 100 mL/hr over 30 Minutes Intravenous Every 12 hours 07/25/11 1357 07/26/11 0630   07/25/11 0600   cefUROXime (ZINACEF) 1.5 g in dextrose 5 % 50 mL IVPB        1.5 g 100 mL/hr over 30 Minutes Intravenous On call to O.R. 07/24/11 1411 07/25/11 0745           V. Charlena Cross, M.D. [facility] [place] Office: [phone] Pager:  [phone]

## 2011-07-31 ENCOUNTER — Inpatient Hospital Stay (HOSPITAL_COMMUNITY): Payer: Medicare Other

## 2011-07-31 LAB — CBC
Hemoglobin: 11.4 g/dL — ABNORMAL LOW (ref 12.0–15.0)
RBC: 3.94 MIL/uL (ref 3.87–5.11)
WBC: 10.4 10*3/uL (ref 4.0–10.5)

## 2011-07-31 MED ORDER — BISACODYL 10 MG RE SUPP: 10.0000 mg | Freq: Every day | RECTAL | Status: DC | PRN

## 2011-07-31 MED ORDER — POTASSIUM CHLORIDE IN NACL 20-0.9 MEQ/L-% IV SOLN
INTRAVENOUS | Status: DC
  Administered 2011-07-31 – 2011-08-01 (×2): 75 mL via INTRAVENOUS
  Administered 2011-08-02: 03:00:00 via INTRAVENOUS
  Filled 2011-07-31 (×5): qty 1000

## 2011-07-31 NOTE — Progress Notes (Addendum)
[facility] Progress Note  07/31/2011 7:31 AM POD 6  Subjective:  Doesn't want to eat the food they are bringing.  Still with nausea.  Afebrile x 24hrs HR 80-90s reg  94%RA  140s-160s systolic Filed Vitals:   07/31/11 0407  BP: 156/80  Pulse: 88  Temp: 97.5 F (36.4 C)  Resp: 18     Physical Exam: Cardiac:  RRR Lungs:  CTAB Abdomen:  Slightly distended.  Decreased BS Incisions:  C/d/i without drainage.  Ecchymosis.   CBC    Component Value Date/Time   WBC 10.4 07/31/2011 0600   RBC 3.94 07/31/2011 0600   HGB 11.4* 07/31/2011 0600   HCT 33.5* 07/31/2011 0600   PLT 249 07/31/2011 0600   MCV 85.0 07/31/2011 0600   MCH 28.9 07/31/2011 0600   MCHC 34.0 07/31/2011 0600   RDW 14.7 07/31/2011 0600   LYMPHSABS 2.2 07/25/2011 0623   MONOABS 0.8 07/25/2011 0623   EOSABS 0.2 07/25/2011 0623   BASOSABS 0.1 07/25/2011 0623    BMET    Component Value Date/Time   NA 132* 07/30/2011 0810   K 4.9 07/30/2011 0810   CL 100 07/30/2011 0810   CO2 27 07/30/2011 0810   GLUCOSE 143* 07/30/2011 0810   BUN 12 07/30/2011 0810   CREATININE 0.60 07/30/2011 0810   CALCIUM 8.4 07/30/2011 0810   GFRNONAA 90* 07/30/2011 0810   GFRAA >90 07/30/2011 0810    INR    Component Value Date/Time   INR 1.26 07/25/2011 1300     Intake/Output Summary (Last 24 hours) at 07/31/11 0731 Last data filed at 07/31/11 0015  Gross per 24 hour  Intake      0 ml  Output    250 ml  Net   -250 ml     Assessment/Plan:  71 y.o. female is s/p Repair of abdominal aortic aneurysm with aortobifemoral bypass graft (16 x 8 mm Dacron graft)  POD 6 -path pending for thyroid US Bx -continue mobilization and OOB -continue IVF until she is taking in better po's.  Newton Pigg, PA-C [facility] [phone] 07/31/2011 7:31 AM  Addendum: Agree with above. 1. Ileus: No nausea now, but abdomen distended. Normal pitch bowel sounds. Will check AXR's and try dulcolax. Ambulate. Minimize  narcotics. 2. Hyponatemia: NS until eating better. F/U Bmet tomorrow. 3. F/U thyroid biopsy   Di Kindle. Edilia Bo, MD, FACS Beeper [phone] 07/31/2011

## 2011-07-31 NOTE — Procedures (Signed)
Technically successful US guided Bx of dominant nodules within the R lobe and thyroid isthmus.  No immediate complications.

## 2011-07-31 NOTE — Progress Notes (Signed)
Tailynn Armetta, PT [phone]  

## 2011-07-31 NOTE — Progress Notes (Signed)
Utilization review completed. Chardonay Scritchfield, RN, BSN. 07/31/11  

## 2011-07-31 NOTE — Progress Notes (Signed)
PT Cancellation Note  Treatment cancelled today due to patient receiving procedure or test.  Pt getting a thyroid biopsy.  Ezzard Standing SPT 07/31/2011, 10:17 AM

## 2011-08-01 LAB — CBC
Platelets: 239 10*3/uL (ref 150–400)
RBC: 3.65 MIL/uL — ABNORMAL LOW (ref 3.87–5.11)
WBC: 8.5 10*3/uL (ref 4.0–10.5)

## 2011-08-01 LAB — CREATININE, SERUM
Creatinine, Ser: 0.55 mg/dL (ref 0.50–1.10)
GFR calc non Af Amer: >90 mL/min (ref >90)

## 2011-08-01 MED ORDER — BISACODYL 10 MG RE SUPP: 10.0000 mg | Freq: Every day | RECTAL | Status: DC | PRN

## 2011-08-01 NOTE — Progress Notes (Signed)
Physical Therapy Treatment Patient Details Name: Amy Stone MRN: [id number] DOB: 08-Jun-1940 Today's Date: 08/01/2011  PT Assessment/Plan  PT - Assessment/Plan Comments on Treatment Session: Patient s/p AAA/ AFBG with decr mobility secondary to pain. Pt with increased discomfort today from consitpation, but agreeable to PT. Once up to tiolet, pt vomiting, limiting tx today. Pt encouraged to continue walking as able to promote intestinal motility. Despite this, pt moving better today with less assist prior to becoming sick.  PT Plan: Discharge plan remains appropriate;Frequency remains appropriate PT Frequency: Min 3X/week Follow Up Recommendations: Home health PT;Supervision - Intermittent Equipment Recommended: Rolling walker with 5" wheels PT Goals  Acute Rehab PT Goals PT Goal: Rolling Supine to Left Side - Progress: Progressing toward goal PT Goal: Supine/Side to Sit - Progress: Progressing toward goal PT Goal: Sit to Stand - Progress: Progressing toward goal PT Goal: Stand to Sit - Progress: Progressing toward goal PT Goal: Ambulate - Progress: Not progressing (Limited today due to n/v)  PT Treatment Precautions/Restrictions  Precautions Precautions: Fall Restrictions Weight Bearing Restrictions: No Mobility (including Balance) Bed Mobility Rolling Left: 5: Supervision;With rail Rolling Left Details (indicate cue type and reason): Cues for sequence rolling - pt wanting to reach and pull on therapist, redirected to increase independence Left Sidelying to Sit: 5: Supervision;HOB flat;With rails Left Sidelying to Sit Details (indicate cue type and reason): Cues for technique and timing LEs with UEs Supine to Sit: 5: Supervision;With rails;HOB flat Supine to Sit Details (indicate cue type and reason): cues for hand placement, technique. Sitting - Scoot to Edge of Bed: 5: Supervision Sit to Supine: 5: Supervision;With rail;HOB flat Transfers Sit to Stand: 5: Supervision;With  upper extremity assist;From bed;From toilet Sit to Stand Details (indicate cue type and reason): Cues for hand placement on bed>RW Stand to Sit: 5: Supervision;With upper extremity assist;To chair/3-in-1;To toilet Stand to Sit Details: Cues to reach back for hand rests. Pt with decreased controll with descent  Stand Pivot Transfers: 4: Min assist;With armrests Stand Pivot Transfer Details (indicate cue type and reason): Min A from toilet>recliner due to be with n/v after attempting to have BM. Steadying assist needed.  Ambulation/Gait Ambulation/Gait: Yes Ambulation/Gait Assistance: 4: Min assist Ambulation/Gait Assistance Details (indicate cue type and reason): Min-guard and cues for RW proximity to self and posture, limited by abdominal soreness Ambulation Distance (Feet): 15 Feet Assistive device: Rolling walker Gait Pattern: Step-through pattern;Decreased stride length;Shuffle Stairs: No Wheelchair Mobility Wheelchair Mobility: No      End of Session PT - End of Session Equipment Utilized During Treatment: Gait belt Activity Tolerance: Patient limited by pain;Patient limited by fatigue (Pt nauseated) Patient left: in chair;with call bell in reach;with family/visitor present Nurse Communication: Mobility status for transfers;Mobility status for ambulation (Vomitting) General Behavior During Session: [facility] for tasks performed Cognition: [facility] for tasks performed  Virl Cagey, [place] [phone]  08/01/2011, 12:31 PM

## 2011-08-01 NOTE — Progress Notes (Signed)
Occupational Therapy Treatment Patient Details Name: Amy Stone MRN: [id number] DOB: 1940-07-16 Today's Date: 08/01/2011  OT Assessment/Plan OT Assessment/Plan Comments on Treatment Session: Pt progressing well. Plan is to return home with [facility] and 24/7 A from family. OT Plan: Discharge plan remains appropriate OT Frequency: Min 2X/week Follow Up Recommendations: Home health OT;Supervision/Assistance - 24 hour Equipment Recommended: None recommended by OT OT Goals ADL Goals ADL Goal: Grooming - Progress: Progressing toward goals ADL Goal: Toilet Transfer - Progress: Progressing toward goals ADL Goal: Toileting - Clothing Manipulation - Progress: Progressing toward goals  OT Treatment Precautions/Restrictions  Precautions Precautions: Fall Restrictions Weight Bearing Restrictions: No   ADL ADL Grooming: Performed;Wash/dry hands;Supervision/safety Grooming Details (indicate cue type and reason): Pt refused to brush teeth or comb hair. Stating she just wanted to go back to bed. Where Assessed - Grooming: Standing at sink Toilet Transfer: Research scientist (life sciences) Method: Proofreader: Regular height toilet;Grab bars Toileting - Clothing Manipulation: Performed;Supervision/safety Where Assessed - Toileting Clothing Manipulation: Sit to stand from 3-in-1 or toilet Toileting - Hygiene: Performed;Supervision/safety Where Assessed - Toileting Hygiene: Sit to stand from 3-in-1 or toilet Ambulation Related to ADLs: Pt required supervision to stay within confines of RW when ambulating to the bathroom ADL Comments: Min amt of cueing and encouragement needed to engage pt in even a small amt of activity. Refused bath stating she was tired and would do it later. Pt perseverated on the fact that she is still unable to eat solid foods. Difficult to redirect pt from that topic. Mobility  Bed Mobility Supine to Sit: 5: Supervision;With rails;HOB  flat Supine to Sit Details (indicate cue type and reason): cues for hand placement, technique. Sitting - Scoot to Edge of Bed: 5: Supervision Sit to Supine: 5: Supervision;With rail;HOB flat Transfers Sit to Stand: 5: Supervision;With upper extremity assist;From bed;From toilet Sit to Stand Details (indicate cue type and reason): cues for hand placement Stand to Sit: 5: Supervision;With upper extremity assist;To bed;To chair/3-in-1 Stand to Sit Details: cues for hand placement. Exercises    End of Session OT - End of Session Activity Tolerance: Patient limited by fatigue;Patient limited by pain Patient left: in chair;with call bell in reach General Behavior During Session: [facility] for tasks performed Cognition: [facility] for tasks performed  Shanielle Correll A OTR/L [phone] 08/01/2011, 11:35 AM

## 2011-08-01 NOTE — Progress Notes (Signed)
VASCULAR PROGRESS NOTE  SUBJECTIVE: No nausea. Some flatus yesterday. No BM. Ambulating.   PHYSICAL EXAM: Filed Vitals:   07/31/11 0825 07/31/11 1410 07/31/11 2033 08/01/11 0537  BP:  149/87 137/78 148/79  Pulse:  92 97 101  Temp:  98.2 F (36.8 C) 98.5 F (36.9 C) 98.4 F (36.9 C)  TempSrc:  Oral Oral Oral  Resp:  20 19 18   Height:      Weight:      SpO2: 95% 92% 90% 91%   Lungs: clear to auscultation Abd: distended, typanitic.  Incisions look fine Palpable DP pulses bilat.  LABS: Lab Results  Component Value Date   WBC 8.5 08/01/2011   HGB 10.5* 08/01/2011   HCT 30.8* 08/01/2011   MCV 84.4 08/01/2011   PLT 239 08/01/2011   Lab Results  Component Value Date   CREATININE 0.55 08/01/2011   Lab Results  Component Value Date   INR 1.26 07/25/2011   AXR: ileus    ASSESSMENT/PLAN: 1. 7 Days Post-Op s/p: Repair of AAA 2. Postop ileus. I have encouraged her to minimize taking pain meds and to ambulate as much as possible. Hgb stable so doubt RP hematoma as cause of ileus. Had hyponatremia yesterday, today's BMET pend. Needs a dulcolax. Will hold off on NGT since no nausea or vomiting.   3. Had thyroid biopsy yesterday. Path pending.  Waverly Ferrari, MD, FACS Beeper: [phone] 08/01/2011

## 2011-08-02 MED ORDER — ATENOLOL 25 MG PO TABS
25.0000 mg | ORAL_TABLET | Freq: Every day | ORAL | Status: DC
  Administered 2011-08-02 – 2011-08-05 (×4): 25 mg via ORAL
  Filled 2011-08-02 (×4): qty 1

## 2011-08-02 MED ORDER — ADULT MULTIVITAMIN W/MINERALS CH
1.0000 | ORAL_TABLET | Freq: Every day | ORAL | Status: DC
  Administered 2011-08-05: 1 via ORAL
  Filled 2011-08-02 (×4): qty 1

## 2011-08-02 MED ORDER — BENAZEPRIL HCL 10 MG PO TABS
10.0000 mg | ORAL_TABLET | Freq: Every day | ORAL | Status: DC
  Administered 2011-08-02 – 2011-08-05 (×4): 10 mg via ORAL
  Filled 2011-08-02 (×4): qty 1

## 2011-08-02 MED ORDER — LEVOTHYROXINE SODIUM 25 MCG PO TABS
25.0000 ug | ORAL_TABLET | Freq: Every day | ORAL | Status: DC
  Administered 2011-08-02 – 2011-08-05 (×4): 25 ug via ORAL
  Filled 2011-08-02 (×5): qty 1

## 2011-08-02 MED ORDER — ATORVASTATIN CALCIUM 40 MG PO TABS
40.0000 mg | ORAL_TABLET | Freq: Every day | ORAL | Status: DC
  Administered 2011-08-02 – 2011-08-04 (×3): 40 mg via ORAL
  Filled 2011-08-02 (×4): qty 1

## 2011-08-02 MED ORDER — ASPIRIN EC 81 MG PO TBEC
81.0000 mg | DELAYED_RELEASE_TABLET | Freq: Every day | ORAL | Status: DC
  Administered 2011-08-02 – 2011-08-05 (×4): 81 mg via ORAL
  Filled 2011-08-02 (×4): qty 1

## 2011-08-02 NOTE — Progress Notes (Signed)
Physical Therapy Treatment Patient Details Name: Amy Stone MRN: [id number] DOB: 24-Jan-1941 Today's Date: 08/02/2011  PT Assessment/Plan  PT - Assessment/Plan Comments on Treatment Session: Pt with improved mobility today, is mod I with ambulation and practiced stairs.  May d/c tomorrow.  Will need orders for HHPT and RW.  Discussed consistent use of RW for safety. PT Plan: Discharge plan remains appropriate;Frequency remains appropriate PT Frequency: Min 3X/week Follow Up Recommendations: Home health PT;Supervision - Intermittent Equipment Recommended: Rolling walker with 5" wheels PT Goals  Acute Rehab PT Goals PT Goal Formulation: With patient Time For Goal Achievement: 2 weeks Pt will Roll Supine to Right Side: with modified independence PT Goal: Rolling Supine to Right Side - Progress: Other (comment) (NT) Pt will Roll Supine to Left Side: with modified independence PT Goal: Rolling Supine to Left Side - Progress: Other (comment) (NT) Pt will go Supine/Side to Sit: with modified independence PT Goal: Supine/Side to Sit - Progress: Met Pt will go Sit to Supine/Side: with modified independence PT Goal: Sit to Supine/Side - Progress: Met Pt will go Sit to Stand: with modified independence PT Goal: Sit to Stand - Progress: Met Pt will go Stand to Sit: with modified independence PT Goal: Stand to Sit - Progress: Met Pt will Ambulate: with modified independence;with rolling walker;with gait velocity >(comment) ft/second (>300 ft with gait speed >2.62 ft/sec) PT Goal: Ambulate - Progress: Updated due to goal met Pt will Go Up / Down Stairs: 3-5 stairs;with min assist PT Goal: Up/Down Stairs - Progress: Updated due to goal met  PT Treatment Precautions/Restrictions  Precautions Precautions: Fall Restrictions Weight Bearing Restrictions: No Mobility (including Balance) Bed Mobility Bed Mobility: Yes Supine to Sit: 6: Modified independent (Device/Increase time);With rails;HOB  elevated (Comment degrees) (30) Sitting - Scoot to Edge of Bed: 6: Modified independent (Device/Increase time) Sit to Supine: 6: Modified independent (Device/Increase time);HOB elevated (comment degrees) (30) Transfers Transfers: Yes Sit to Stand: 6: Modified independent (Device/Increase time);From bed;With upper extremity assist Stand to Sit: 6: Modified independent (Device/Increase time);To bed;With upper extremity assist Ambulation/Gait Ambulation/Gait: Yes Ambulation/Gait Assistance: 6: Modified independent (Device/Increase time) Ambulation Distance (Feet): 250 Feet Assistive device: Rolling walker Gait Pattern: Trunk flexed Gait velocity: decreased Stairs: Yes Stairs Assistance: 4: Min assist Stairs Assistance Details (indicate cue type and reason): min HHA given as pt does not have HR Stair Management Technique: No rails;Forwards;Step to pattern Number of Stairs: 3  Wheelchair Mobility Wheelchair Mobility: No  Posture/Postural Control Posture/Postural Control: No significant limitations Balance Balance Assessed: Yes Dynamic Standing Balance Dynamic Standing - Balance Support: No upper extremity supported;During functional activity Dynamic Standing - Level of Assistance: 5: Stand by assistance    End of Session PT - End of Session Equipment Utilized During Treatment: Gait belt Activity Tolerance: Patient tolerated treatment well Patient left: in bed;with call bell in reach;with family/visitor present Nurse Communication: Mobility status for ambulation General Behavior During Session: [facility] for tasks performed Cognition: [facility] for tasks performed Cognitive Impairment: Pt much improved today. Lyanne Co, PT  Acute Rehab Services  [phone]  Lyanne Co 08/02/2011, 11:07 AM

## 2011-08-02 NOTE — Progress Notes (Addendum)
[facility] Progress Note  08/02/2011 7:37 AM POD 8  Subjective:  Wants heart monitor off! Otherwise no complaints.  Afebrile x 24hrs  HR 90-100 reg  140s systolic 92%RA Filed Vitals:   08/02/11 0427  BP: 142/79  Pulse: 99  Temp: 98.8 F (37.1 C)  Resp: 18     Physical Exam: Cardiac:  RRR Lungs:  CTAB Abdomen:  Still somewhat distended but improved.  +BS Incisions:  C/d/i.  Still with increased ecchymosis   CBC    Component Value Date/Time   WBC 8.5 08/01/2011 0516   RBC 3.65* 08/01/2011 0516   HGB 10.5* 08/01/2011 0516   HCT 30.8* 08/01/2011 0516   PLT 239 08/01/2011 0516   MCV 84.4 08/01/2011 0516   MCH 28.8 08/01/2011 0516   MCHC 34.1 08/01/2011 0516   RDW 14.8 08/01/2011 0516   LYMPHSABS 2.2 07/25/2011 0623   MONOABS 0.8 07/25/2011 0623   EOSABS 0.2 07/25/2011 0623   BASOSABS 0.1 07/25/2011 0623    BMET    Component Value Date/Time   NA 132* 07/30/2011 0810   K 4.9 07/30/2011 0810   CL 100 07/30/2011 0810   CO2 27 07/30/2011 0810   GLUCOSE 143* 07/30/2011 0810   BUN 12 07/30/2011 0810   CREATININE 0.55 08/01/2011 0516   CALCIUM 8.4 07/30/2011 0810   GFRNONAA >90 08/01/2011 0516   GFRAA >90 08/01/2011 0516    INR    Component Value Date/Time   INR 1.26 07/25/2011 1300     Intake/Output Summary (Last 24 hours) at 08/02/11 0737 Last data filed at 08/02/11 0600  Gross per 24 hour  Intake   1980 ml  Output    803 ml  Net   1177 ml     Assessment/Plan:  71 y.o. female is s/p AAA  POD 8 Pt is much improved this am. Will advance diet Hopefully home tomorrow if diet is tolerated. Continue to mobilize D/c IVF  Newton Pigg, PA-C [facility] [phone] 08/02/2011 7:37 AM  1. Ileus resolved. Passing lots of flatus. Has had BM. Start regular diet, po meds, po pain meds.  2. THYROID FINE NEEDLE ASPIRATION RESULTS: FINDINGS CONSISTENT WITH A HYPERPLASTIC NODULE. 3. Home tomorrow if tolerates diet.  Di Kindle. Edilia Bo,  MD, FACS Beeper [phone] 08/02/2011

## 2011-08-03 MED ORDER — BISACODYL 10 MG RE SUPP
10.0000 mg | Freq: Once | RECTAL | Status: AC
  Administered 2011-08-03: 10 mg via RECTAL
  Filled 2011-08-03: qty 1

## 2011-08-03 MED ORDER — SODIUM CHLORIDE 0.9 % IV SOLN
INTRAVENOUS | Status: DC
  Administered 2011-08-03: 12:00:00 via INTRAVENOUS

## 2011-08-03 NOTE — Progress Notes (Signed)
Utilization review completed. Madaleine Simmon, RN, BSN. 08/03/11  

## 2011-08-03 NOTE — Progress Notes (Signed)
Physical Therapy Cancellation Note: attempted to see pt x 2 this morning. First time pt stating 10/10 Rsided pain and provided ice with RN aware. Returned after pain meds and pt unable to maintain arousal and refused mobility at this time. Encourage mobility with nursing and will attempt at later date. Thanks Delaney Meigs, PT [phone]

## 2011-08-03 NOTE — Progress Notes (Signed)
OT Cancellation Note  Treatment cancelled today due to patient's refusal to participate: pt states she has pain on lower left side. RN in room and states she has provided pain medication. Both RN and therapist encouraged OOB activity with no success. Will check back as schedule allows. Thanks! Glendale Chard, OTR/L Pager: [phone] 08/03/2011    Donata Reddick 08/03/2011, 9:38 AM

## 2011-08-03 NOTE — Progress Notes (Addendum)
[facility] Progress Note  08/03/2011 11:03 AM POD 9  Subjective:  Does not feel well this am.  Nauseated.  +BM this am.  Tm 98.9 HR 80-90s reg  140-150s systolic 90%RA Filed Vitals:   08/03/11 0439  BP: 154/72  Pulse: 89  Temp: 97.8 F (36.6 C)  Resp: 18    Physical Exam: Cardiac:  RRR Lungs:  CTAB Abdomen:  Distended, decreased BS, +flatus, +BM Incisions:  C/d/i without drainage, still with ecchymosis, bilateral groin incisions are c/d/i without evidence of infection.  CBC    Component Value Date/Time   WBC 8.5 08/01/2011 0516   RBC 3.65* 08/01/2011 0516   HGB 10.5* 08/01/2011 0516   HCT 30.8* 08/01/2011 0516   PLT 239 08/01/2011 0516   MCV 84.4 08/01/2011 0516   MCH 28.8 08/01/2011 0516   MCHC 34.1 08/01/2011 0516   RDW 14.8 08/01/2011 0516   LYMPHSABS 2.2 07/25/2011 0623   MONOABS 0.8 07/25/2011 0623   EOSABS 0.2 07/25/2011 0623   BASOSABS 0.1 07/25/2011 0623    BMET    Component Value Date/Time   NA 132* 07/30/2011 0810   K 4.9 07/30/2011 0810   CL 100 07/30/2011 0810   CO2 27 07/30/2011 0810   GLUCOSE 143* 07/30/2011 0810   BUN 12 07/30/2011 0810   CREATININE 0.55 08/01/2011 0516   CALCIUM 8.4 07/30/2011 0810   GFRNONAA >90 08/01/2011 0516   GFRAA >90 08/01/2011 0516    INR    Component Value Date/Time   INR 1.26 07/25/2011 1300     Intake/Output Summary (Last 24 hours) at 08/03/11 1103 Last data filed at 08/02/11 2243  Gross per 24 hour  Intake    240 ml  Output    500 ml  Net   -260 ml     Assessment/Plan:  71 y.o. female is s/p Aortobifem bypass  POD 9 -still nauseated. Will make pt NPO and restart IVF.  She did have BM this am and also flatus.  BS are minimal. -check CBC and CMP in am.  Newton Pigg, PA-C [facility] [phone] 08/03/2011 11:03 AM  Agree with above. She had tolerated regular food yesterday, but had one episode of nausea today. Had BM today and has been passing flatus. Abd still a little  distended, but better than it has been. NPO for now, resume regular diet when nausea resolves. Check Amylase. Hold off on D/C. Afeb. WBC is normal. Had some mild hyponatremia, will recheck BMET in am.   Di Kindle. Edilia Bo, MD, FACS Beeper [phone] 08/03/2011

## 2011-08-04 LAB — AMYLASE: Amylase: 21 U/L (ref 0–105)

## 2011-08-04 LAB — COMPREHENSIVE METABOLIC PANEL
AST: 13 U/L (ref 0–37)
BUN: 9 mg/dL (ref 6–23)
CO2: 22 mEq/L (ref 19–32)
Calcium: 7.8 mg/dL — ABNORMAL LOW (ref 8.4–10.5)
Creatinine, Ser: 0.55 mg/dL (ref 0.50–1.10)
GFR calc Af Amer: >90 mL/min (ref >90)
GFR calc non Af Amer: >90 mL/min (ref >90)

## 2011-08-04 LAB — URINALYSIS, ROUTINE W REFLEX MICROSCOPIC
Glucose, UA: NEGATIVE mg/dL
Ketones, ur: >80 mg/dL — AB
Leukocytes, UA: NEGATIVE
Nitrite: NEGATIVE
Protein, ur: NEGATIVE mg/dL
Urobilinogen, UA: 1 mg/dL (ref 0.0–1.0)

## 2011-08-04 LAB — CBC
HCT: 30.3 % — ABNORMAL LOW (ref 36.0–46.0)
MCH: 28 pg (ref 26.0–34.0)
MCV: 84.9 fL (ref 78.0–100.0)
Platelets: 356 10*3/uL (ref 150–400)
RBC: 3.57 MIL/uL — ABNORMAL LOW (ref 3.87–5.11)

## 2011-08-04 NOTE — Progress Notes (Addendum)
[facility] Progress Note  08/04/2011 10:02 AM POD 10  Subjective:  Feels much better this am.  States she does not and will not have clear liquids aka jello, broth.  She has had BM and flatus  Afebrile x 24 hours otherwise VSS Filed Vitals:   08/04/11 0405  BP: 125/61  Pulse: 93  Temp: 98.3 F (36.8 C)  Resp: 18     Physical Exam: Cardiac:  RRR Lungs:  CTAB Abdomen:  Still slightly distended, but softer than yesterday.  + BS and + BM NT to palpation Incisions:  C/d/i with ecchymosis Extremities:  No edema BLE  CBC    Component Value Date/Time   WBC 16.6* 08/04/2011 0530   RBC 3.57* 08/04/2011 0530   HGB 10.0* 08/04/2011 0530   HCT 30.3* 08/04/2011 0530   PLT 356 08/04/2011 0530   MCV 84.9 08/04/2011 0530   MCH 28.0 08/04/2011 0530   MCHC 33.0 08/04/2011 0530   RDW 15.0 08/04/2011 0530   LYMPHSABS 2.2 07/25/2011 0623   MONOABS 0.8 07/25/2011 0623   EOSABS 0.2 07/25/2011 0623   BASOSABS 0.1 07/25/2011 0623    BMET    Component Value Date/Time   NA 131* 08/04/2011 0530   K 3.5 08/04/2011 0530   CL 96 08/04/2011 0530   CO2 22 08/04/2011 0530   GLUCOSE 64* 08/04/2011 0530   BUN 9 08/04/2011 0530   CREATININE 0.55 08/04/2011 0530   CALCIUM 7.8* 08/04/2011 0530   GFRNONAA >90 08/04/2011 0530   GFRAA >90 08/04/2011 0530    INR    Component Value Date/Time   INR 1.26 07/25/2011 1300   Amylase    Component Value Date/Time   AMYLASE 21 08/04/2011 0530   Lipase     Component Value Date/Time   LIPASE 26.0 07/06/2008 0000       Intake/Output Summary (Last 24 hours) at 08/04/11 1002 Last data filed at 08/04/11 0858  Gross per 24 hour  Intake    925 ml  Output    401 ml  Net    524 ml     Assessment/Plan:  71 y.o. female is s/p aortobifem bypass grafting  POD 10 -Pt feels much better this am.  She wants to eat, but refuses clear liquids. Will advance to full liquids today. -her WBC is increased from 4/16 from 8.5 to 16.6 today.  Will check a UA.  Her  incisions look good without evidence of infection and she has been afebrile. -amylase and lipase WNL -continue mobilization -acute surgical blood loss anemia stable and pt is tolerating. -still with mild hyponatremia -ck labs in am 08/05/11  Newton Pigg, PA-C [facility] [phone] 08/04/2011 10:02 AM  Nausea has resolved. Had BM today. Abdomen is soft, slightly distended, with normal pitched bowel sounds. She tolerated full liquids. Will advance to regular diet. Possible home tomorrow if no further nausea.  Di Kindle. Edilia Bo, MD, FACS Beeper [phone] 08/04/2011

## 2011-08-04 NOTE — Progress Notes (Signed)
Physical Therapy Treatment Patient Details Name: Amy Stone MRN: [id number] DOB: 04-22-1940 Today's Date: 08/04/2011 Time: 8295-6213 PT Time Calculation (min): 9 min  PT Assessment / Plan / Recommendation Comments on Treatment Session  Patient with continued improvement in mobility.  Occasional cues for safety.    Follow Up Recommendations  Home health PT;Supervision - Intermittent    Equipment Recommendations  Rolling walker with 5" wheels    Frequency Min 3X/week    Precautions / Restrictions Precautions Precautions: Fall Restrictions Weight Bearing Restrictions: No   Pertinent Vitals/Pain VSS and no pain    Mobility  Bed Mobility Bed Mobility: Rolling Left;Left Sidelying to Sit;Sitting - Scoot to Edge of Bed Rolling Left: 5: Supervision;With rail Left Sidelying to Sit: 5: Supervision;With rails;HOB flat Sitting - Scoot to Edge of Bed: 6: Modified independent (Device/Increase time) Sit to Supine: Not Tested (comment) Details for Bed Mobility Assistance: cues only Transfers Transfers: Sit to Stand;Stand to Sit Sit to Stand: 6: Modified independent (Device/Increase time);From bed;With upper extremity assist Stand to Sit: 6: Modified independent (Device/Increase time);With upper extremity assist;To chair/3-in-1;With armrests Stand Pivot Transfers: Not tested (comment) Ambulation/Gait Ambulation/Gait Assistance: 5: Supervision Ambulation Distance (Feet): 300 Feet Assistive device: Rolling walker Ambulation/Gait Assistance Details: Still needs cues to keep RW close to her.   Gait Pattern: Trunk flexed Gait velocity: decreased Stairs: No Wheelchair Mobility Wheelchair Mobility: No         PT Goals Acute Rehab PT Goals Time For Goal Achievement: 08/09/11 Potential to Achieve Goals: Good PT Goal: Rolling Supine to Right Side - Progress: Progressing toward goal PT Goal: Rolling Supine to Left Side - Progress: Progressing toward goal PT Goal: Supine/Side to Sit -  Progress: Progressing toward goal PT Goal: Sit to Stand - Progress: Progressing toward goal PT Goal: Stand to Sit - Progress: Progressing toward goal PT Goal: Ambulate - Progress: Progressing toward goal  Visit Information  Last PT Received On: 08/04/11 Assistance Needed: +1    Subjective Data  Subjective: " I feel a lot better today."   Cognition  Overall Cognitive Status: Appears within functional limits for tasks assessed/performed Arousal/Alertness: Awake/alert Orientation Level: Appears intact for tasks assessed Behavior During Session: [facility] for tasks performed    Balance   NA   End of Session Patient left in chair with call bell in reach.  Family present.    INGOLD,Ashleymarie Granderson 08/04/2011, 12:58 PMDawn Ingold,PT Acute Rehabilitation [phone] (pager)

## 2011-08-05 LAB — CBC
MCHC: 33.8 g/dL (ref 30.0–36.0)
Platelets: 354 10*3/uL (ref 150–400)
RDW: 15.1 % (ref 11.5–15.5)
WBC: 13.1 10*3/uL — ABNORMAL HIGH (ref 4.0–10.5)

## 2011-08-05 LAB — BASIC METABOLIC PANEL
Chloride: 99 mEq/L (ref 96–112)
GFR calc Af Amer: >90 mL/min (ref >90)
GFR calc non Af Amer: >90 mL/min (ref >90)
Potassium: 2.8 mEq/L — ABNORMAL LOW (ref 3.5–5.1)
Sodium: 133 mEq/L — ABNORMAL LOW (ref 135–145)

## 2011-08-05 MED ORDER — OXYCODONE HCL 5 MG PO TABS: 5.0000 mg | ORAL_TABLET | ORAL | Status: AC | PRN

## 2011-08-05 NOTE — Progress Notes (Signed)
Pt. Discharged 08/05/2011  11:55 AM Discharge instructions reviewed with patient/family. Patient/family verbalized understanding. All Rx's given. Questions answered as needed. Pt. Discharged to home with family/self.  Amy Stone

## 2011-08-05 NOTE — Progress Notes (Signed)
   CARE MANAGEMENT NOTE 08/05/2011  Patient:  Amy Stone, Amy Stone   Account Number:  [id number]  Date Initiated:  08/05/2011  Documentation initiated by:  [facility]  Subjective/Objective Assessment:   Abdominal Aortic Aneurysm     Action/Plan:   lives at home with husband.   Anticipated DC Date:  08/05/2011   Anticipated DC Plan:  HOME W HOME HEALTH SERVICES      DC Planning Services  CM consult      [facility] Choice  HOME HEALTH   Choice offered to / List presented to:  C-1 Patient   DME arranged  3-N-1  Levan Hurst      DME agency  [facility]     HH arranged  HH-2 PT  HH-3 OT      [facility] agency  [facility]   Status of service:  Completed, signed off Medicare Important Message given?   (If response is "NO", the following Medicare IM given date fields will be blank) Date Medicare IM given:   Date Additional Medicare IM given:    Discharge Disposition:  HOME W HOME HEALTH SERVICES  Per UR Regulation:    If discussed at Long Length of Stay Meetings, dates discussed:    Comments:  08/05/2011 1015 Spoke to pt and gave permission to speak with husband. Husband requested [facility] for [facility]. Contacted [facility] for [facility] PT/OT and DME for scheduled d/c today. Isidoro Donning RN CCM Case Mgmt phone [phone]

## 2011-08-05 NOTE — Discharge Summary (Signed)
[facility] Discharge Summary   Patient ID:  Amy Stone MRN: [id number] DOB/AGE: 06-01-1940 71 y.o.  Admit date: 07/25/2011 Discharge date: 08/05/2011 Date of Surgery: 07/25/2011 Surgeon: Surgeon(s): Chuck Hint, MD Sherren Kerns, MD  Admission Diagnosis: Abdominal Aortic Aneurysm  Discharge Diagnoses:  Abdominal Aortic Aneurysm  Secondary Diagnoses: Past Medical History  Diagnosis Date  . Epigastric pain   . Osteoporosis, unspecified   . Cervicalgia   . Unspecified tinnitus   . Neoplasm of uncertain behavior of skin   . Thoracic aneurysm, ruptured   . Other chest pain   . Breast screening, unspecified   . Unspecified essential hypertension   . Other and unspecified hyperlipidemia   . Unspecified hypothyroidism   . Osteoarthrosis, unspecified whether generalized or localized, unspecified site   . Chronic airway obstruction, not elsewhere classified   . Peripheral vascular disease   . Myocardial infarction 2007  . Coronary atherosclerosis of unspecified type of vessel, native or graft 2007  . PONV (postoperative nausea and vomiting)   . Shortness of breath   . Blood transfusion   . UTI (lower urinary tract infection)   . Cancer     Bladder  . Thyroid nodule     seen on CT Scan 07/19/2011    Procedure(s): AORTA BIFEMORAL BYPASS GRAFT  Discharged Condition: good  HPI:  Amy Stone is a 71 y.o. female who I have been following with an abdominal aortic aneurysm. I saw on 05/10/2011. At that time the aneurysm had enlarged to 5.1 cm. In April of 2012 it was 4.4 cm. The aneurysm was asymptomatic however she also has known aortoiliac occlusive disease and has had bilateral lower from a claudication. She has claudication in both lower extremities which involves her hips thighs and calves bilaterally. Her symptoms are brought on by ambulation and relieved with rest. She denies any significant rest pain or history of nonhealing ulcers.  She  has undergone an arteriogram which shows she has significant diffuse iliac disease bilaterally and for this reason is not a candidate for endovascular repair of her aneurysm.  She was admitted for aortobifem bypass  Hospital Course:  Amy Stone is a 71 y.o. female is S/P AORTA BIFEMORAL BYPASS GRAFT Extubated: POD # 0 Post-op wounds healing well Pt. Ambulating, voiding and taking PO diet without difficulty. Pt pain controlled with PO pain meds. Labs as below Complications:pt had a prolonged ilieus post-op which has resolved  Consults:     Significant Diagnostic Studies: CBC Lab Results  Component Value Date   WBC 13.1* 08/05/2011   HGB 9.7* 08/05/2011   HCT 28.7* 08/05/2011   MCV 84.9 08/05/2011   PLT 354 08/05/2011    BMET    Component Value Date/Time   NA 133* 08/05/2011 0650   K 2.8* 08/05/2011 0650   CL 99 08/05/2011 0650   CO2 25 08/05/2011 0650   GLUCOSE 78 08/05/2011 0650   BUN 5* 08/05/2011 0650   CREATININE 0.55 08/05/2011 0650   CALCIUM 7.6* 08/05/2011 0650   GFRNONAA >90 08/05/2011 0650   GFRAA >90 08/05/2011 0650   COAG Lab Results  Component Value Date   INR 1.26 07/25/2011   INR 0.95 07/25/2011   INR 0.95 07/05/2011     Disposition:  Discharge to :Home Discharge Orders    Future Orders Please Complete By Expires   Resume previous diet      Driving Restrictions      Comments:   No driving for  4 weeks   Lifting restrictions      Comments:   No lifting for 6 weeks   Call MD for:  temperature >100.5      Call MD for:  redness, tenderness, or signs of infection (pain, swelling, bleeding, redness, odor or green/yellow discharge around incision site)      Call MD for:  severe or increased pain, loss or decreased feeling  in affected limb(s)      Increase activity slowly      Comments:   Walk with assistance use walker or cane as needed   May walk up steps      May shower       No dressing needed      may wash over wound with mild soap and water       ABDOMINAL PROCEDURE/ANEURYSM REPAIR/AORTO-BIFEMORAL BYPASS:  Call MD for increased abdominal pain; cramping diarrhea; nausea/vomiting         Amy, Stone  Home Medication Instructions [id number]   Printed on:08/05/11 0942  Medication Information                    aspirin EC 81 MG tablet Take 81 mg by mouth daily.           Multiple Vitamin (MULITIVITAMIN WITH MINERALS) TABS Take 1 tablet by mouth daily. Stopped on 3-20 for procedure           levothyroxine (SYNTHROID, LEVOTHROID) 50 MCG tablet Take 25 mcg by mouth daily. Takes one half (25mg ) of a 50mg  tablet daily.           simvastatin (ZOCOR) 80 MG tablet Take 80 mg by mouth at bedtime.           atenolol (TENORMIN) 25 MG tablet Take 25 mg by mouth daily.           benazepril (LOTENSIN) 10 MG tablet Take 10 mg by mouth daily.           tiotropium (SPIRIVA) 18 MCG inhalation capsule Place 18 mcg into inhaler and inhale daily.           tiotropium (SPIRIVA HANDIHALER) 18 MCG inhalation capsule            atenolol (TENORMIN) 25 MG tablet            oxyCODONE (OXY IR/ROXICODONE) 5 MG immediate release tablet Take 1-2 tablets (5-10 mg total) by mouth every 4 (four) hours as needed.            Verbal and written Discharge instructions given to the patient. Wound care per Discharge AVS Follow-up Information    Follow up with DICKSON,CHRISTOPHER S, MD in 4 weeks. (office will arrange -sent)    Contact information:   [address] [place] [address] [phone]          Signed: [name] 08/05/2011, 9:42 AM

## 2011-08-05 NOTE — Progress Notes (Addendum)
[facility] [place]  Post-op  Intra-abdominal Surgery note  Date of Surgery: 07/25/2011 Surgeon: Surgeon(s): Chuck Hint, MD Sherren Kerns, MD POD: 11 Days Post-Op Procedure(s): AORTA BIFEMORAL BYPASS GRAFT  History of Present Illness  Amy Stone is a 71 y.o. female who is  up s/p Procedure(s): AORTA BIFEMORAL BYPASS GRAFT Pt is doing well. denies incisional pain; denies nausea/vomiting; denies diarrhea. has had flatus;has had BM  IMAGING: No results found.  Significant Diagnostic Studies: CBC Lab Results  Component Value Date   WBC 13.1* 08/05/2011   HGB 9.7* 08/05/2011   HCT 28.7* 08/05/2011   MCV 84.9 08/05/2011   PLT 354 08/05/2011    BMET    Component Value Date/Time   NA 133* 08/05/2011 0650   K 2.8* 08/05/2011 0650   CL 99 08/05/2011 0650   CO2 25 08/05/2011 0650   GLUCOSE 78 08/05/2011 0650   BUN 5* 08/05/2011 0650   CREATININE 0.55 08/05/2011 0650   CALCIUM 7.6* 08/05/2011 0650   GFRNONAA >90 08/05/2011 0650   GFRAA >90 08/05/2011 0650    COAG Lab Results  Component Value Date   INR 1.26 07/25/2011   INR 0.95 07/25/2011   INR 0.95 07/05/2011   No results found for this basename: PTT    I/O last 3 completed shifts: In: 1850 [P.O.:600; I.V.:1250] Out: 1001 [Urine:1000; Stool:1] Total I/O In: 120 [P.O.:120] Out: -   Physical Examination BP Readings from Last 3 Encounters:  08/05/11 149/76  08/05/11 149/76  07/05/11 146/78   Temp Readings from Last 3 Encounters:  08/05/11 97.9 F (36.6 C) Oral  08/05/11 97.9 F (36.6 C) Oral  07/05/11 96.9 F (36.1 C) Oral   SpO2 Readings from Last 3 Encounters:  08/05/11 92%  08/05/11 92%  07/05/11 95%   Pulse Readings from Last 3 Encounters:  08/05/11 85  08/05/11 85  07/05/11 60    General: A&O x 3, WDWN female in NAD Pulmonary: normal non-labored breathing  Cardiac: Heart rate : regular ,  Abdomen:abdomen soft, non-tender and normal active bowel sounds Abdominal  wound:clean, dry, intact  Neurologic: A&O X 3; Appropriate Affect ; SENSATION: normal; MOTOR FUNCTION:  moving all extremities equally. Speech is fluent/normal  Vascular Exam:BLE warm and well perfused, some pitting edema Extremities without ischemic changes, no Gangrene , no cellulitis; no open wounds;   LOWER EXTREMITY PULSES           RIGHT                                      LEFT      DORSALIS PEDIS      ANTERIOR TIBIAL palpable palpable    Assessment/Plan: Amy Stone is a 71 y.o. female who is 11 Days Post-Op Procedure(s): AORTA BIFEMORAL BYPASS GRAFT Pt is doing well- ilieus resolved Had formed BM this am  Taking po without N/V Ambulating Home today F/u 4 weeks      [name] [phone] 08/05/2011 8:46 AM   Tolerating diet Overall doing well Incision healing  D/c home today  Fabienne Bruns, MD [facility] [place] Office: [phone] Pager: [phone]

## 2011-08-07 ENCOUNTER — Other Ambulatory Visit: Payer: Self-pay | Admitting: *Deleted

## 2011-08-07 DIAGNOSIS — I714 Abdominal aortic aneurysm, without rupture: Secondary | ICD-10-CM

## 2011-08-07 NOTE — Discharge Summary (Signed)
Agree with plans for D/C.  Di Kindle. Edilia Bo, MD, FACS Beeper [phone] 08/07/2011

## 2011-08-10 MED ORDER — CEFAZOLIN SODIUM-DEXTROSE 2-3 GM-% IV SOLR
INTRAVENOUS | Status: AC
  Filled 2011-08-10: qty 50

## 2011-09-05 ENCOUNTER — Encounter: Payer: Self-pay | Admitting: Vascular Surgery

## 2011-09-06 ENCOUNTER — Encounter (INDEPENDENT_AMBULATORY_CARE_PROVIDER_SITE_OTHER): Payer: Medicare Other | Admitting: *Deleted

## 2011-09-06 ENCOUNTER — Encounter: Payer: Self-pay | Admitting: Vascular Surgery

## 2011-09-06 ENCOUNTER — Ambulatory Visit (INDEPENDENT_AMBULATORY_CARE_PROVIDER_SITE_OTHER): Payer: Medicare Other | Admitting: Vascular Surgery

## 2011-09-06 VITALS — BP 113/61 | HR 72 | Resp 18 | Ht 63.0 in | Wt 117.0 lb

## 2011-09-06 DIAGNOSIS — I714 Abdominal aortic aneurysm, without rupture: Secondary | ICD-10-CM

## 2011-09-06 DIAGNOSIS — Z48812 Encounter for surgical aftercare following surgery on the circulatory system: Secondary | ICD-10-CM

## 2011-09-06 DIAGNOSIS — I70219 Atherosclerosis of native arteries of extremities with intermittent claudication, unspecified extremity: Secondary | ICD-10-CM | POA: Insufficient documentation

## 2011-09-06 NOTE — Assessment & Plan Note (Signed)
She is now status post repair of her abdominal aortic aneurysm which was performed on 07/25/2011. Her incisions are all healing nicely and overall she's doing well the exception of her issues with constipation. I've instructed her on the importance of receiving prophylactic antibiotics for any invasive procedures. No further studies are necessary for her aneurysm unless any specific issues arise.

## 2011-09-06 NOTE — Assessment & Plan Note (Signed)
This patient had significant aortoiliac occlusive disease which was addressed at the time of her aneurysm repair with aortobifemoral bypass grafting. She has normal ABIs and palpable pedal pulses. She is currently not a smoker. I've encouraged her to stay as active as possible. I'll see her back in 6 months. She knows to call sooner if she has problems.

## 2011-09-06 NOTE — Progress Notes (Signed)
[facility] [place]  Patient name: Amy Stone MRN: [id number] DOB: 08/27/1940 Sex: female  REASON FOR VISIT: follow up after abdominal aortic aneurysm repair.  HPI: Amy Stone is a 71 y.o. female who had an abdominal aortic aneurysm which was gradually enlarging and also significant aortoiliac occlusive disease. Both issues were addressed with her aortobifemoral bypass graft which was performed on 07/25/2011. She did well postoperatively returns for her first outpatient visit. She's had no significant abdominal or back pain. Her claudication symptoms have resolved and she is walking much better. Her appetite is returning and she's had no nausea or vomiting. She's had no chest pain or shortness of breath. Her only complaint has been some issues with constipation.   REVIEW OF SYSTEMS: Arly.Keller ] denotes positive finding; [  ] denotes negative finding  CARDIOVASCULAR:  [ ]  chest pain   [ ]  dyspnea on exertion    CONSTITUTIONAL:  [ ]  fever   [ ]  chills  PHYSICAL EXAM: Filed Vitals:   09/06/11 1128  BP: 113/61  Pulse: 72  Resp: 18  Height: 5\' 3"  (1.6 m)  Weight: 117 lb (53.071 kg)   Body mass index is 20.73 kg/(m^2). GENERAL: The patient is a well-nourished female, in no acute distress. The vital signs are documented above. CARDIOVASCULAR: There is a regular rate and rhythm. She has palpable femoral dorsalis pedis and posterior tibial pulses bilaterally.  PULMONARY: There is good air exchange bilaterally without wheezing or rales. Her abdomen is soft and nontender. She has normal pitch bowel sounds. Her abdominal incision and groin incisions are healing nicely.  MEDICAL ISSUES:  Atherosclerotic PVD with intermittent claudication This patient had significant aortoiliac occlusive disease which was addressed at the time of her aneurysm repair with aortobifemoral bypass grafting. She has normal ABIs and palpable pedal pulses. She is currently not a smoker. I've  encouraged her to stay as active as possible. I'll see her back in 6 months. She knows to call sooner if she has problems.  ABDOMINAL AORTIC ANEURYSM She is now status post repair of her abdominal aortic aneurysm which was performed on 07/25/2011. Her incisions are all healing nicely and overall she's doing well the exception of her issues with constipation. I've instructed her on the importance of receiving prophylactic antibiotics for any invasive procedures. No further studies are necessary for her aneurysm unless any specific issues arise.    Maegan Buller S [facility] [place] Beeper: [phone]

## 2011-12-18 ENCOUNTER — Other Ambulatory Visit: Payer: Self-pay | Admitting: Family Medicine

## 2011-12-22 ENCOUNTER — Encounter: Payer: Self-pay | Admitting: Family Medicine

## 2011-12-22 ENCOUNTER — Ambulatory Visit (INDEPENDENT_AMBULATORY_CARE_PROVIDER_SITE_OTHER): Payer: Medicare Other | Admitting: Family Medicine

## 2011-12-22 VITALS — BP 130/64 | HR 54 | Resp 14 | Wt 125.2 lb

## 2011-12-22 DIAGNOSIS — Z23 Encounter for immunization: Secondary | ICD-10-CM

## 2011-12-22 DIAGNOSIS — E785 Hyperlipidemia, unspecified: Secondary | ICD-10-CM

## 2011-12-22 DIAGNOSIS — E039 Hypothyroidism, unspecified: Secondary | ICD-10-CM

## 2011-12-22 DIAGNOSIS — I10 Essential (primary) hypertension: Secondary | ICD-10-CM

## 2011-12-22 MED ORDER — SIMVASTATIN 80 MG PO TABS: 80.0000 mg | ORAL_TABLET | Freq: Every day | ORAL | Status: DC

## 2011-12-22 MED ORDER — TIOTROPIUM BROMIDE MONOHYDRATE 18 MCG IN CAPS: 18.0000 ug | ORAL_CAPSULE | Freq: Every day | RESPIRATORY_TRACT | Status: DC

## 2011-12-22 MED ORDER — BENAZEPRIL HCL 10 MG PO TABS: 10.0000 mg | ORAL_TABLET | Freq: Every day | ORAL | Status: DC

## 2011-12-22 MED ORDER — ATENOLOL 25 MG PO TABS: 25.0000 mg | ORAL_TABLET | Freq: Every day | ORAL | Status: DC

## 2011-12-22 MED ORDER — LEVOTHYROXINE SODIUM 50 MCG PO TABS: 25.0000 ug | ORAL_TABLET | Freq: Every day | ORAL | Status: DC

## 2011-12-22 NOTE — Assessment & Plan Note (Signed)
Stable control, refill meds.

## 2011-12-22 NOTE — Progress Notes (Signed)
  Subjective:    Patient ID: Amy Stone, female    DOB: 09-27-1940, 70 y.o.   MRN: [id number]  HPI 71 year old female presets for medicaiton follow up.   She is overdue for medicare wellness visit.  Hypertension:  Well controleld on current meds.  Using medication without problems or lightheadedness: None Chest pain with exertion:None Edema:None Short of breath:None Average home BPs:Not checking recently, [prior doing well. Other issues:  Hypothyroid:  Lab Results  Component Value Date   TSH 0.39 07/06/2010    Overdue for DM screen and cholesterol eval.   CAD, AAA,PVD followed by cardiology (dr. Mariah Milling) and vascular. Vascular MD,  Dr. Edilia Bo performed Aorta Bi-femoral bypass graft in 07/2011 She reports that following surgery she had some complications.. Per records 11 days in hospital.. Issues with ileus, hyponatremia, anemia,confusion. She also had thyroid FNA while she was there. Pathology is hyperplastic nodule.     Review of Systems  Constitutional: Negative for fever and fatigue.  HENT: Negative for ear pain.   Eyes: Negative for pain.  Respiratory: Negative for chest tightness and shortness of breath.   Cardiovascular: Negative for chest pain, palpitations and leg swelling.  Gastrointestinal: Negative for abdominal pain.  Genitourinary: Negative for dysuria.       Objective:   Physical Exam  Constitutional: Vital signs are normal. She appears well-developed and well-nourished. She is cooperative.  Non-toxic appearance. She does not appear ill. No distress.  HENT:  Head: Normocephalic.  Right Ear: Hearing, tympanic membrane, external ear and ear canal normal. Tympanic membrane is not erythematous, not retracted and not bulging.  Left Ear: Hearing, tympanic membrane, external ear and ear canal normal. Tympanic membrane is not erythematous, not retracted and not bulging.  Nose: No mucosal edema or rhinorrhea. Right sinus exhibits no maxillary sinus tenderness  and no frontal sinus tenderness. Left sinus exhibits no maxillary sinus tenderness and no frontal sinus tenderness.  Mouth/Throat: Uvula is midline, oropharynx is clear and moist and mucous membranes are normal.  Eyes: Conjunctivae, EOM and lids are normal. Pupils are equal, round, and reactive to light. No foreign bodies found.  Neck: Trachea normal and normal range of motion. Neck supple. Carotid bruit is not present. No mass and no thyromegaly present.  Cardiovascular: Normal rate, regular rhythm, S1 normal, S2 normal, normal heart sounds, intact distal pulses and normal pulses.  Exam reveals no gallop and no friction rub.   No murmur heard. Pulmonary/Chest: Effort normal and breath sounds normal. Not tachypneic. No respiratory distress. She has no decreased breath sounds. She has no wheezes. She has no rhonchi. She has no rales.  Abdominal: Soft. Normal appearance and bowel sounds are normal. There is no tenderness.  Neurological: She is alert.  Skin: Skin is warm, dry and intact. No rash noted.  Psychiatric: Her speech is normal and behavior is normal. Judgment and thought content normal. Her mood appears not anxious. Cognition and memory are normal. She does not exhibit a depressed mood.          Assessment & Plan:

## 2011-12-22 NOTE — Assessment & Plan Note (Signed)
Due for re-eval. 

## 2011-12-22 NOTE — Patient Instructions (Addendum)
Schedule medicare wellness visit with fasting labs prior in next few months.

## 2012-02-20 ENCOUNTER — Telehealth: Payer: Self-pay | Admitting: Family Medicine

## 2012-02-20 DIAGNOSIS — I10 Essential (primary) hypertension: Secondary | ICD-10-CM

## 2012-02-20 DIAGNOSIS — M810 Age-related osteoporosis without current pathological fracture: Secondary | ICD-10-CM

## 2012-02-20 DIAGNOSIS — E039 Hypothyroidism, unspecified: Secondary | ICD-10-CM

## 2012-02-20 DIAGNOSIS — E785 Hyperlipidemia, unspecified: Secondary | ICD-10-CM

## 2012-02-20 NOTE — Telephone Encounter (Signed)
Message copied by Excell Seltzer on Tue Feb 20, 2012 10:06 AM ------      Message from: Baldomero Lamy      Created: Thu Feb 15, 2012  7:22 AM      Regarding: Cpx labs Wed 11/6       Please order  future cpx labs for pt's upcomming lab appt.      Thanks      Rodney Booze

## 2012-02-21 ENCOUNTER — Other Ambulatory Visit (INDEPENDENT_AMBULATORY_CARE_PROVIDER_SITE_OTHER): Payer: Medicare Other

## 2012-02-21 DIAGNOSIS — M810 Age-related osteoporosis without current pathological fracture: Secondary | ICD-10-CM

## 2012-02-21 DIAGNOSIS — E039 Hypothyroidism, unspecified: Secondary | ICD-10-CM

## 2012-02-21 DIAGNOSIS — E785 Hyperlipidemia, unspecified: Secondary | ICD-10-CM

## 2012-02-21 LAB — COMPREHENSIVE METABOLIC PANEL
ALT: 13 U/L (ref 0–35)
Albumin: 4.3 g/dL (ref 3.5–5.2)
BUN: 12 mg/dL (ref 6–23)
CO2: 28 mEq/L (ref 19–32)
Calcium: 9.8 mg/dL (ref 8.4–10.5)
Chloride: 105 mEq/L (ref 96–112)
Creatinine, Ser: 0.9 mg/dL (ref 0.4–1.2)
GFR: 69.84 mL/min (ref >60.00)
Potassium: 4.2 mEq/L (ref 3.5–5.1)

## 2012-02-21 LAB — TSH: TSH: 0.59 u[IU]/mL (ref 0.35–5.50)

## 2012-02-21 LAB — LIPID PANEL
HDL: 55.7 mg/dL (ref >39.00)
Total CHOL/HDL Ratio: 2
Triglycerides: 108 mg/dL (ref 0.0–149.0)

## 2012-02-27 ENCOUNTER — Encounter: Payer: Self-pay | Admitting: Family Medicine

## 2012-02-27 ENCOUNTER — Ambulatory Visit (INDEPENDENT_AMBULATORY_CARE_PROVIDER_SITE_OTHER): Payer: Medicare Other | Admitting: Family Medicine

## 2012-02-27 VITALS — BP 130/70 | HR 62 | Temp 98.3°F | Ht 63.0 in | Wt 129.8 lb

## 2012-02-27 DIAGNOSIS — Z0000 Encounter for general adult medical examination without abnormal findings: Secondary | ICD-10-CM

## 2012-02-27 DIAGNOSIS — E785 Hyperlipidemia, unspecified: Secondary | ICD-10-CM

## 2012-02-27 DIAGNOSIS — I10 Essential (primary) hypertension: Secondary | ICD-10-CM

## 2012-02-27 DIAGNOSIS — E039 Hypothyroidism, unspecified: Secondary | ICD-10-CM

## 2012-02-27 DIAGNOSIS — J449 Chronic obstructive pulmonary disease, unspecified: Secondary | ICD-10-CM

## 2012-02-27 DIAGNOSIS — J209 Acute bronchitis, unspecified: Secondary | ICD-10-CM

## 2012-02-27 MED ORDER — AZITHROMYCIN 250 MG PO TABS: ORAL_TABLET | ORAL | Status: DC

## 2012-02-27 NOTE — Progress Notes (Signed)
Subjective:    Patient ID: Amy Stone, female    DOB: [birth year], 71 y.o.   MRN: [id number]  HPI  I have personally reviewed the Medicare Annual Wellness questionnaire and have noted 1. The patient's medical and social history 2. Their use of alcohol, tobacco or illicit drugs 3. Their current medications and supplements 4. The patient's functional ability including ADL's, fall risks, home safety risks and hearing or visual             impairment. 5. Diet and physical activities 6. Evidence for depression or mood disorders The patients weight, height, BMI and visual acuity have been recorded in the chart I have made referrals, counseling and provided education to the patient based review of the above and I have provided the pt with a written personalized care plan for preventive services.  Hypertension:  Well controlled on current meds: atenolol, lotensin,  Using medication without problems or lightheadedness: Noe Chest pain with exertion:None Edema:None Short of breath:mild Average home BPs: well controlled Other issues:  See last OV in 12/2011. Also reviewed OV by Dr. Mariah Milling Cardiology.   Elevated Cholesterol: LDL at goal <70 on simvastatin 80 mg daily Lab Results  Component Value Date   CHOL 135 02/21/2012   HDL 55.70 02/21/2012   LDLCALC 58 02/21/2012   TRIG 108.0 02/21/2012   CHOLHDL 2 02/21/2012   Using medications without problems:None Muscle aches: None Diet compliance:Eating a lot of fresh fruit and veggies. Exercise: walks a lot, cutting wood Other complaints:  Hypothyroid:Stable control on current dose Lab Results  Component Value Date   TSH 0.59 02/21/2012   COPD, on spiriva: Daily SOB despite this. No daily cough at baseline.   In last 1-2 week, cough productive yellow, chest tightness, no increase in SOB, no wheeze. No fever, no face pain, ear pain, no sore throat.   Review of Systems  Constitutional: Negative for fever and fatigue.  HENT: Negative  for ear pain.   Eyes: Negative for pain.  Respiratory: Positive for cough, chest tightness and shortness of breath.   Cardiovascular: Negative for chest pain, palpitations and leg swelling.  Gastrointestinal: Negative for abdominal pain.  Genitourinary: Negative for dysuria.       Objective:   Physical Exam  Constitutional: Vital signs are normal. She appears well-developed and well-nourished. She is cooperative.  Non-toxic appearance. She does not appear ill. No distress.  HENT:  Head: Normocephalic.  Right Ear: Hearing, tympanic membrane, external ear and ear canal normal. Tympanic membrane is not erythematous, not retracted and not bulging.  Left Ear: Hearing, tympanic membrane, external ear and ear canal normal. Tympanic membrane is not erythematous, not retracted and not bulging.  Nose: Mucosal edema and rhinorrhea present. Right sinus exhibits no maxillary sinus tenderness and no frontal sinus tenderness. Left sinus exhibits no maxillary sinus tenderness and no frontal sinus tenderness.  Mouth/Throat: Uvula is midline, oropharynx is clear and moist and mucous membranes are normal.  Eyes: Conjunctivae normal, EOM and lids are normal. Pupils are equal, round, and reactive to light. No foreign bodies found.  Neck: Trachea normal and normal range of motion. Neck supple. Carotid bruit is not present. No mass and no thyromegaly present.  Cardiovascular: Normal rate, regular rhythm, S1 normal, S2 normal, normal heart sounds, intact distal pulses and normal pulses.  Exam reveals no gallop and no friction rub.   No murmur heard. Pulmonary/Chest: Effort normal and breath sounds normal. Not tachypneic. No respiratory distress. She has no decreased breath sounds.  She has no wheezes. She has no rhonchi. She has no rales.  Abdominal: Soft. Normal appearance and bowel sounds are normal. She exhibits no distension, no fluid wave, no abdominal bruit and no mass. There is no hepatosplenomegaly. There is  no tenderness. There is no rebound, no guarding and no CVA tenderness. No hernia.  Genitourinary: No breast swelling, tenderness, discharge or bleeding. Pelvic exam was performed with patient supine.  Lymphadenopathy:    She has no cervical adenopathy.    She has no axillary adenopathy.  Neurological: She is alert. She has normal strength. No cranial nerve deficit or sensory deficit.  Skin: Skin is warm, dry and intact. No rash noted.  Psychiatric: Her speech is normal and behavior is normal. Judgment normal. Her mood appears not anxious. Cognition and memory are normal. She does not exhibit a depressed mood.          Assessment & Plan:  The patient's preventative maintenance and recommended screening tests for an annual wellness exam were reviewed in full today. Brought up to date unless services declined.  Counselled on the importance of diet, exercise, and its role in overall health and mortality. The patient's FH and SH was reviewed, including their home life, tobacco status, and drug and alcohol status.   Former smoker Vaccines:Uptodate except due for shingles (will give today).  Got flu in 12/2011.  Mammogram: to be scheduled  Colon:  2009 Dr. Nils Flack,  polyp.[name]  DEXA: 2012 DVE/PAP: not indicated, TAH for non-cancer reasons.   ACUTE BRONCHITIS/ COPD EXACERBAITON: no sign of wheexze. No indication for steroids but given change in sputum.. Will treat with antibiotics. Given baseline breathing not great on spiriva alone, when feeling better consider spirometry recheck to see if Advair needed.

## 2012-02-27 NOTE — Patient Instructions (Addendum)
Call to schedule mammogram at [facility].  Start antibiotics for COPD exacerbation. Call if not turning the corner in next 72 hour or Shortness of breath.  Mucinex DM for cough.

## 2012-02-27 NOTE — Assessment & Plan Note (Signed)
Well controlled. Continue current medication.  

## 2012-03-04 NOTE — Telephone Encounter (Signed)
Entered in error/awt °

## 2012-03-13 ENCOUNTER — Ambulatory Visit: Payer: Medicare Other | Admitting: Vascular Surgery

## 2012-03-26 ENCOUNTER — Encounter: Payer: Self-pay | Admitting: Neurosurgery

## 2012-03-27 ENCOUNTER — Ambulatory Visit (INDEPENDENT_AMBULATORY_CARE_PROVIDER_SITE_OTHER): Payer: Medicare Other | Admitting: Neurosurgery

## 2012-03-27 ENCOUNTER — Encounter: Payer: Self-pay | Admitting: Neurosurgery

## 2012-03-27 VITALS — BP 147/72 | HR 53 | Resp 14 | Ht 63.0 in | Wt 131.0 lb

## 2012-03-27 DIAGNOSIS — I714 Abdominal aortic aneurysm, without rupture: Secondary | ICD-10-CM

## 2012-03-27 DIAGNOSIS — I70219 Atherosclerosis of native arteries of extremities with intermittent claudication, unspecified extremity: Secondary | ICD-10-CM

## 2012-03-27 NOTE — Progress Notes (Signed)
[facility] PAD/PVD Office Note  CC: Followup status post aortobifemoral bypass graft April 2013 Referring Physician: Edilia Bo  History of Present Illness: 71 year old female patient of Dr. Edilia Bo status post aortic bifemoral bypass graft April 2013. The patient has also had a CABG, she has done well with both and has no complaints. The patient denies any unusual abdominal or back pain and has no signs of claudication or rest pain.  Past Medical History  Diagnosis Date  . Epigastric pain   . Osteoporosis, unspecified   . Cervicalgia   . Unspecified tinnitus   . Neoplasm of uncertain behavior of skin   . Thoracic aneurysm, ruptured   . Other chest pain   . Breast screening, unspecified   . Unspecified essential hypertension   . Other and unspecified hyperlipidemia   . Unspecified hypothyroidism   . Osteoarthrosis, unspecified whether generalized or localized, unspecified site   . Chronic airway obstruction, not elsewhere classified   . Peripheral vascular disease   . Myocardial infarction 2007  . Coronary atherosclerosis of unspecified type of vessel, native or graft 2007  . PONV (postoperative nausea and vomiting)   . Shortness of breath   . Blood transfusion   . UTI (lower urinary tract infection)   . Cancer     Bladder  . Thyroid nodule     seen on CT Scan 07/19/2011  . AAA (abdominal aortic aneurysm)     ROS: [x]  Positive   [ ]  Denies    General: [ ]  Weight loss, [ ]  Fever, [ ]  chills Neurologic: [ ]  Dizziness, [ ]  Blackouts, [ ]  Seizure [ ]  Stroke, [ ]  "Mini stroke", [ ]  Slurred speech, [ ]  Temporary blindness; [ ]  weakness in arms or legs, [ ]  Hoarseness Cardiac: [ ]  Chest pain/pressure, [ ]  Shortness of breath at rest [x ] Shortness of breath with exertion, [ ]  Atrial fibrillation or irregular heartbeat Vascular: [ ]  Pain in legs with walking, [ ]  Pain in legs at rest, [ ]  Pain in legs at night,  [ ]  Non-healing ulcer, [ ]  Blood clot in  vein/DVT,   Pulmonary: [ ]  Home oxygen, [ ]  Productive cough, [ ]  Coughing up blood, [ ]  Asthma,  [ ]  Wheezing Musculoskeletal:  [ ]  Arthritis, [ ]  Low back pain, [ ]  Joint pain Hematologic: [ ]  Easy Bruising, [ ]  Anemia; [ ]  Hepatitis Gastrointestinal: [ ]  Blood in stool, [ ]  Gastroesophageal Reflux/heartburn, [ ]  Trouble swallowing Urinary: [ ]  chronic Kidney disease, [ ]  on HD - [ ]  MWF or [ ]  TTHS, [ ]  Burning with urination, [ ]  Difficulty urinating Skin: [ ]  Rashes, [ ]  Wounds Psychological: [ ]  Anxiety, [ ]  Depression   Social History History  Substance Use Topics  . Smoking status: Former Smoker -- 1 years    Types: Cigarettes    Quit date: 11/24/2005  . Smokeless tobacco: Never Used     Comment: 40 pack years   . Alcohol Use: No    Family History Family History  Problem Relation Age of Onset  . Alzheimer's disease Father   . Parkinsonism Father   . Heart defect Mother     enlarged heart  . Heart attack Mother   . Hypertension Sister   . Diabetes Sister   . Heart disease Sister   . Hyperlipidemia Sister   . Hypothyroidism Sister   . Aneurysm Paternal Grandfather     Allergies  Allergen Reactions  .  Iohexol      Code: HIVES, Desc: CONTRAST REACTION OF HIVES (LARGE WHELPS DEVELOPED OVER ENTIRE BODY/MMS   . Prednisone Other (See Comments)    hallucinations    Current Outpatient Prescriptions  Medication Sig Dispense Refill  . aspirin EC 81 MG tablet Take 81 mg by mouth daily.      [name] atenolol (TENORMIN) 25 MG tablet Take 1 tablet (25 mg total) by mouth daily.  90 tablet  3  . azithromycin (ZITHROMAX) 250 MG tablet 2 tab po x 1 day then 1 tab po daily  6 tablet  0  . benazepril (LOTENSIN) 10 MG tablet Take 1 tablet (10 mg total) by mouth daily.  90 tablet  3  . levothyroxine (SYNTHROID, LEVOTHROID) 50 MCG tablet Take 0.5 tablets (25 mcg total) by mouth daily. Takes one half (25mg ) of a 50mg  tablet daily.  90 tablet  0  . Multiple Vitamin (MULITIVITAMIN WITH  MINERALS) TABS Take 1 tablet by mouth daily. Stopped on 3-20 for procedure      . simvastatin (ZOCOR) 80 MG tablet Take 1 tablet (80 mg total) by mouth at bedtime.  90 tablet  3  . tiotropium (SPIRIVA) 18 MCG inhalation capsule Place 1 capsule (18 mcg total) into inhaler and inhale daily.  30 capsule  11    Physical Examination  Filed Vitals:   03/27/12 1033  BP: 147/72  Pulse: 53  Resp: 14    Body mass index is 23.21 kg/(m^2).  General:  WDWN in NAD Gait: Normal HEENT: WNL Eyes: Pupils equal Pulmonary: normal non-labored breathing , without Rales, rhonchi,  wheezing Cardiac: RRR, without  Murmurs, rubs or gallops; No carotid bruits Abdomen: soft, NT, no masses Skin: no rashes, ulcers noted Vascular Exam/Pulses: Palpable PT and DP pulses bilaterally, palpable femoral pulses bilaterally, she has 3+ radial pulses, there are no carotid bruits heard  Extremities without ischemic changes, no Gangrene , no cellulitis; no open wounds;  Musculoskeletal: no muscle wasting or atrophy  Neurologic: A&O X 3; Appropriate Affect ; SENSATION: normal; MOTOR FUNCTION:  moving all extremities equally. Speech is fluent/normal  Non-Invasive Vascular Imaging: None  ASSESSMENT/PLAN: This is asymptomatic patient doing well status post aortobifem bypass graft. The patient will followup in one year for repeat check, if she has any difficulties in the interim she knows to call the office. The patient's questions were encouraged and answered, she is in agreement with this plan.  Lauree Chandler ANP  Clinic M.D.: Edilia Bo

## 2012-04-19 ENCOUNTER — Ambulatory Visit: Payer: Self-pay | Admitting: Family Medicine

## 2012-04-30 ENCOUNTER — Encounter: Payer: Self-pay | Admitting: Family Medicine

## 2012-05-09 ENCOUNTER — Encounter: Payer: Self-pay | Admitting: Family Medicine

## 2012-05-09 ENCOUNTER — Ambulatory Visit (INDEPENDENT_AMBULATORY_CARE_PROVIDER_SITE_OTHER): Payer: Medicare Other | Admitting: Family Medicine

## 2012-05-09 VITALS — BP 130/78 | HR 79 | Temp 98.2°F | Ht 63.0 in | Wt 128.0 lb

## 2012-05-09 DIAGNOSIS — J441 Chronic obstructive pulmonary disease with (acute) exacerbation: Secondary | ICD-10-CM

## 2012-05-09 DIAGNOSIS — J209 Acute bronchitis, unspecified: Secondary | ICD-10-CM

## 2012-05-09 MED ORDER — DOXYCYCLINE HYCLATE 100 MG PO TABS: 100.0000 mg | ORAL_TABLET | Freq: Two times a day (BID) | ORAL | Status: DC

## 2012-05-09 MED ORDER — ALBUTEROL SULFATE HFA 108 (90 BASE) MCG/ACT IN AERS: 2.0000 | INHALATION_SPRAY | Freq: Four times a day (QID) | RESPIRATORY_TRACT | Status: DC | PRN

## 2012-05-09 NOTE — Progress Notes (Signed)
Nature conservation officer at [facility] [address] [place] [address] Phone: [phone] Fax: [phone]  Date:  05/09/2012   Name:  Amy Stone   DOB:  1941-04-03   MRN:  [id number] Gender: female Age: 72 y.o.  Primary Physician:  Kerby Nora, MD  Evaluating MD: Hannah Beat, MD   Chief Complaint: Sore Throat, Cough and Headache   History of Present Illness:  Amy Stone is a 72 y.o. pleasant patient who presents with the following:  COPD and CAD, presents with acute illness:  Sore all in her shoulder, back of neck, and coughing all the time. No fever.  Stays short of breath - has gotten worse some No real wheezing AF, eating and drinking OK Coughing so bad that everything hurts No real myalgias No ST, earache, n/v  Patient Active Problem List  Diagnosis  . HYPOTHYROIDISM  . HYPERLIPIDEMIA  . COMMON MIGRAINE  . HYPERTENSION  . CAD  . ABDOMINAL AORTIC ANEURYSM  . CHRONIC OBSTRUCTIVE PULMONARY DISEASE, MODERATE  . OSTEOARTHRITIS  . OSTEOPOROSIS  . SK (seborrheic keratosis)  . Iliac artery stenosis, bilateral  . AAA (abdominal aortic aneurysm)  . Atherosclerotic PVD with intermittent claudication  . Atherosclerosis of native arteries of the extremities with intermittent claudication    Past Medical History  Diagnosis Date  . Epigastric pain   . Osteoporosis, unspecified   . Cervicalgia   . Unspecified tinnitus   . Neoplasm of uncertain behavior of skin   . Thoracic aneurysm, ruptured   . Other chest pain   . Breast screening, unspecified   . Unspecified essential hypertension   . Other and unspecified hyperlipidemia   . Unspecified hypothyroidism   . Osteoarthrosis, unspecified whether generalized or localized, unspecified site   . Chronic airway obstruction, not elsewhere classified   . Peripheral vascular disease   . Myocardial infarction 2007  . Coronary atherosclerosis of unspecified type of vessel, native or graft 2007  . PONV  (postoperative nausea and vomiting)   . Shortness of breath   . Blood transfusion   . UTI (lower urinary tract infection)   . Cancer     Bladder  . Thyroid nodule     seen on CT Scan 07/19/2011  . AAA (abdominal aortic aneurysm)     Past Surgical History  Procedure Date  . Acute inferior mi 11/2005  . Port wine stain removal w/ laser 2004    bladder cancer  . Goiter removal 1979  . Coronary artery bypass graft 11/2005  . Aorta - bilateral femoral artery bypass graft 07/25/2011    Procedure: AORTA BIFEMORAL BYPASS GRAFT;  Surgeon: Chuck Hint, MD;  Location: [facility] OR;  Service: Vascular;  Laterality: Bilateral;  . Abdominal hysterectomy   . Pr vein bypass graft,aorto-fem-pop July 25, 2011    History  Substance Use Topics  . Smoking status: Former Smoker -- 1 years    Types: Cigarettes    Quit date: 11/24/2005  . Smokeless tobacco: Never Used     Comment: 40 pack years   . Alcohol Use: No    Family History  Problem Relation Age of Onset  . Alzheimer's disease Father   . Parkinsonism Father   . Heart defect Mother     enlarged heart  . Heart attack Mother   . Hypertension Sister   . Diabetes Sister   . Heart disease Sister   . Hyperlipidemia Sister   . Hypothyroidism Sister   . Aneurysm Paternal Grandfather  Allergies  Allergen Reactions  . Iohexol      Code: HIVES, Desc: CONTRAST REACTION OF HIVES (LARGE WHELPS DEVELOPED OVER ENTIRE BODY/MMS   . Prednisone Other (See Comments)    hallucinations    Medication list has been reviewed and updated.  Outpatient Prescriptions Prior to Visit  Medication Sig Dispense Refill  . aspirin EC 81 MG tablet Take 81 mg by mouth daily.      [name] atenolol (TENORMIN) 25 MG tablet Take 1 tablet (25 mg total) by mouth daily.  90 tablet  3  . azithromycin (ZITHROMAX) 250 MG tablet 2 tab po x 1 day then 1 tab po daily  6 tablet  0  . benazepril (LOTENSIN) 10 MG tablet Take 1 tablet (10 mg total) by mouth daily.  90 tablet  3    . levothyroxine (SYNTHROID, LEVOTHROID) 50 MCG tablet Take 0.5 tablets (25 mcg total) by mouth daily. Takes one half (25mg ) of a 50mg  tablet daily.  90 tablet  0  . Multiple Vitamin (MULITIVITAMIN WITH MINERALS) TABS Take 1 tablet by mouth daily. Stopped on 3-20 for procedure      . simvastatin (ZOCOR) 80 MG tablet Take 1 tablet (80 mg total) by mouth at bedtime.  90 tablet  3  . tiotropium (SPIRIVA) 18 MCG inhalation capsule Place 1 capsule (18 mcg total) into inhaler and inhale daily.  30 capsule  11   Last reviewed on 05/09/2012  9:47 AM by Consuello Masse, CMA  Review of Systems:  ROS: GEN: Acute illness details above GI: Tolerating PO intake GU: maintaining adequate hydration and urination Pulm: No SOB Interactive and getting along well at home.  Otherwise, ROS is as per the HPI.   Physical Examination: BP 130/78  Pulse 79  Temp 98.2 F (36.8 C) (Oral)  Ht 5\' 3"  (1.6 m)  Wt 128 lb (58.06 kg)  BMI 22.67 kg/m2  SpO2 94%  Ideal Body Weight: Weight in (lb) to have BMI = 25: 140.8    GEN: A and O x 3. WDWN. NAD.    ENT: Nose clear, ext NML.  No LAD.  No JVD.  TM's clear. Oropharynx clear.  PULM: Normal WOB, no distress. No crackles, wheezes, rhonchi. CV: RRR, no M/G/R, No rubs, No JVD.   EXT: warm and well-perfused, No c/c/e. PSYCH: Pleasant and conversant.  Assessment and Plan:  1. Bronchitis, acute   2. COPD exacerbation    Acute bronchitis: discussed plan of care. Given length of symptoms and overall history and risk profile, will treat with ABX in this case. Continue with additional supportive care, cough medications, liquids, sleep, steam / vaporizer.   Orders Today:  No orders of the defined types were placed in this encounter.    Updated Medication List: (Includes new medications, updates to list, dose adjustments) Meds ordered this encounter  Medications  . doxycycline (VIBRA-TABS) 100 MG tablet    Sig: Take 1 tablet (100 mg total) by mouth 2 (two)  times daily.    Dispense:  20 tablet    Refill:  0  . albuterol (PROVENTIL HFA;VENTOLIN HFA) 108 (90 BASE) MCG/ACT inhaler    Sig: Inhale 2 puffs into the lungs every 6 (six) hours as needed for wheezing.    Dispense:  1 Inhaler    Refill:  3    Medications Discontinued: There are no discontinued medications.   Signed, Elpidio Galea. Tateanna Bach, MD 05/09/2012 9:56 AM

## 2012-05-10 ENCOUNTER — Telehealth: Payer: Self-pay | Admitting: *Deleted

## 2012-05-10 NOTE — Telephone Encounter (Signed)
Received fax from the pharmacy that insurance wont cover albuterol, prefers pro air. Can you change rx to [employer]

## 2012-05-10 NOTE — Telephone Encounter (Signed)
Okay to make change

## 2012-05-10 NOTE — Telephone Encounter (Signed)
Pharmacy advised  

## 2012-09-03 ENCOUNTER — Ambulatory Visit (INDEPENDENT_AMBULATORY_CARE_PROVIDER_SITE_OTHER): Payer: Medicare Other | Admitting: Family Medicine

## 2012-09-03 ENCOUNTER — Encounter: Payer: Self-pay | Admitting: Family Medicine

## 2012-09-03 ENCOUNTER — Ambulatory Visit (INDEPENDENT_AMBULATORY_CARE_PROVIDER_SITE_OTHER)
Admission: RE | Admit: 2012-09-03 | Discharge: 2012-09-03 | Disposition: A | Discharge status: Home / Self Care | Payer: Medicare Other | Source: Ambulatory Visit | Attending: Family Medicine | Admitting: Family Medicine

## 2012-09-03 VITALS — BP 120/72 | HR 56 | Temp 97.7°F | Ht 63.0 in | Wt 134.5 lb

## 2012-09-03 DIAGNOSIS — R05 Cough: Secondary | ICD-10-CM

## 2012-09-03 DIAGNOSIS — M25511 Pain in right shoulder: Secondary | ICD-10-CM

## 2012-09-03 DIAGNOSIS — R490 Dysphonia: Secondary | ICD-10-CM

## 2012-09-03 DIAGNOSIS — M25519 Pain in unspecified shoulder: Secondary | ICD-10-CM

## 2012-09-03 MED ORDER — ATENOLOL 25 MG PO TABS: 25.0000 mg | ORAL_TABLET | Freq: Every day | ORAL | Status: DC

## 2012-09-03 MED ORDER — TIOTROPIUM BROMIDE MONOHYDRATE 18 MCG IN CAPS: 18.0000 ug | ORAL_CAPSULE | Freq: Every day | RESPIRATORY_TRACT | Status: DC

## 2012-09-03 MED ORDER — SIMVASTATIN 80 MG PO TABS: 80.0000 mg | ORAL_TABLET | Freq: Every day | ORAL | Status: DC

## 2012-09-03 MED ORDER — MELOXICAM 15 MG PO TABS: 15.0000 mg | ORAL_TABLET | Freq: Every day | ORAL | Status: DC

## 2012-09-03 MED ORDER — LEVOTHYROXINE SODIUM 50 MCG PO TABS: 25.0000 ug | ORAL_TABLET | Freq: Every day | ORAL | Status: DC

## 2012-09-03 MED ORDER — BENAZEPRIL HCL 10 MG PO TABS: 10.0000 mg | ORAL_TABLET | Freq: Every day | ORAL | Status: DC

## 2012-09-03 MED ORDER — FLUTICASONE PROPIONATE 50 MCG/ACT NA SUSP: 2.0000 | Freq: Every day | NASAL | Status: DC

## 2012-09-03 NOTE — Patient Instructions (Addendum)
Start nasal steroid spray and zyrtec at bedtime for possible allergies. We will call you with chest X-ray results.  Start meloxicam for pain and inflammation in shoulder. Apply heat and do daily range of motion exercises.  Follow up appt in 2 weeks.

## 2012-09-03 NOTE — Assessment & Plan Note (Signed)
Most likely rotator cuff injury and neck trapezius/SCM strain but will eval as above.  Treat with heat, exercises and meloxicam.

## 2012-09-03 NOTE — Assessment & Plan Note (Addendum)
MAy simply be due to allergies, but given pt smoking history and shoulder pain... concern for carcinoma possible pancoast tumor. Will eval with CXR.  pt also had thyroid biopsy in last year.. begning pathology but biopsy site near one location of pain  Consider ENT referral for vocal cord eval etc. if not improving.

## 2012-09-03 NOTE — Progress Notes (Signed)
Subjective:    Patient ID: Amy Stone, female    DOB: Jun 05, 1940, 72 y.o.   MRN: [id number]  HPI  72 year old female  With history of CAD, COPD presents with 2 months of hoarse voice, no sore throat. Has noted voice diminishes and loses voice at time.  Runny nose chronically.  No sneeze. Dry cough, feels deep. Feels like something crawling in throat. Denies mucus in throat.  Tried OTC allegra without relief.   Stable SOB on spiriva, no wheeze.  No fever.  She has also noted pain in right  neck down to  right shoulder pain starting at the same time. Pain with raisiing arm above head. No new injury.  Sore in lateral anterior neck.  Ongoing since 06/2012  In 07/2011 had  FNA of thyroid nodule.. Came back benign.  Not smoking, quit 2007, >25 pack year history.  Nml CXR and chest CT ( except thyroid nodule) in 2013  Review of Systems  Constitutional: Negative for fever and fatigue.  HENT: Negative for ear pain, congestion and sinus pressure.   Eyes: Negative for pain.  Respiratory: Positive for cough. Negative for chest tightness, shortness of breath and wheezing.   Cardiovascular: Negative for chest pain, palpitations and leg swelling.  Gastrointestinal: Negative for abdominal pain.  Genitourinary: Negative for dysuria.  All other systems reviewed and are negative.       Objective:   Physical Exam  Constitutional: Vital signs are normal. She appears well-developed and well-nourished. She is cooperative.  Non-toxic appearance. She does not appear ill. No distress.  HENT:  Head: Normocephalic.  Right Ear: Hearing, tympanic membrane, external ear and ear canal normal. Tympanic membrane is not erythematous, not retracted and not bulging.  Left Ear: Hearing, tympanic membrane, external ear and ear canal normal. Tympanic membrane is not erythematous, not retracted and not bulging.  Nose: No mucosal edema or rhinorrhea. Right sinus exhibits no maxillary sinus tenderness and no  frontal sinus tenderness. Left sinus exhibits no maxillary sinus tenderness and no frontal sinus tenderness.  Mouth/Throat: Uvula is midline, oropharynx is clear and moist and mucous membranes are normal.  Eyes: Conjunctivae, EOM and lids are normal. Pupils are equal, round, and reactive to light. No foreign bodies found.  Neck: Trachea normal. Neck supple. Muscular tenderness present. No spinous process tenderness present. Carotid bruit is not present. Decreased range of motion present. Thyromegaly present. No mass present.    Areas of tenderness  Cardiovascular: Normal rate, regular rhythm, S1 normal, S2 normal, normal heart sounds, intact distal pulses and normal pulses.  Exam reveals no gallop and no friction rub.   No murmur heard. Pulmonary/Chest: Effort normal and breath sounds normal. Not tachypneic. No respiratory distress. She has no decreased breath sounds. She has no wheezes. She has no rhonchi. She has no rales.  Abdominal: Soft. Normal appearance and bowel sounds are normal. There is no tenderness.  Musculoskeletal:       Right shoulder: She exhibits decreased range of motion, tenderness and pain. She exhibits no bony tenderness, no swelling and no effusion.  Pain with abduction and ext rotation, no pain with int rotation. Neg neer's neg drop arm.  neg spurling's  Lymphadenopathy:    She has no cervical adenopathy.  Neurological: She is alert.  Skin: Skin is warm, dry and intact. No rash noted.  Psychiatric: Her speech is normal and behavior is normal. Judgment and thought content normal. Her mood appears not anxious. Cognition and memory are normal. She does  not exhibit a depressed mood.          Assessment & Plan:

## 2012-09-12 IMAGING — US US THYROID BIOPSY
1 series · 13 of 25 positions shown · non-contrast
Comparison: none

INDICATION: Indeterminate thyroid nodules, history of partial left
thyroidectomy

[Series 1: us thyroid biopsy · 0.07mm/px · 13 of 27 slices shown]
[im 1/27]
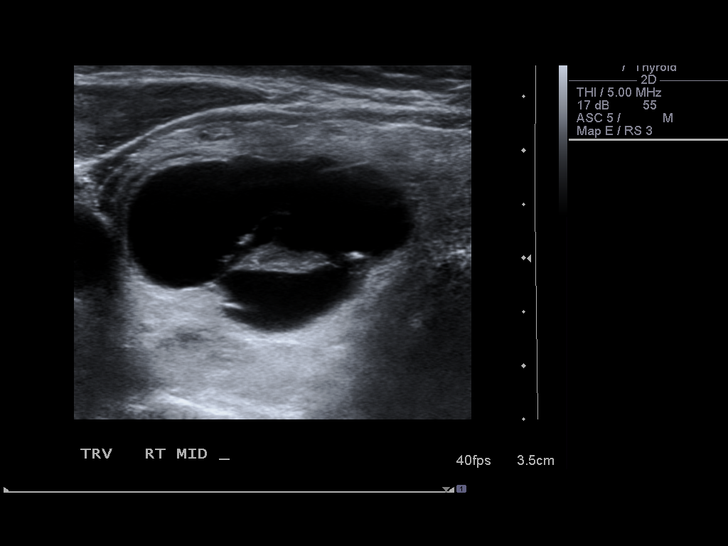
[im 3/27]
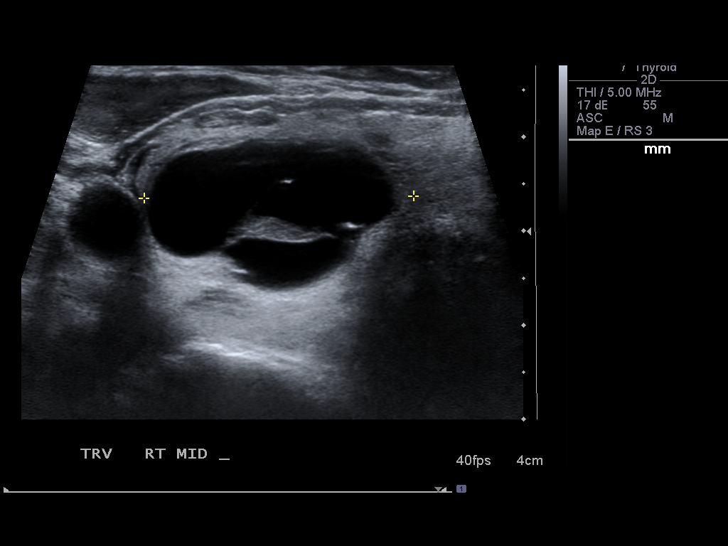
[im 5/27]
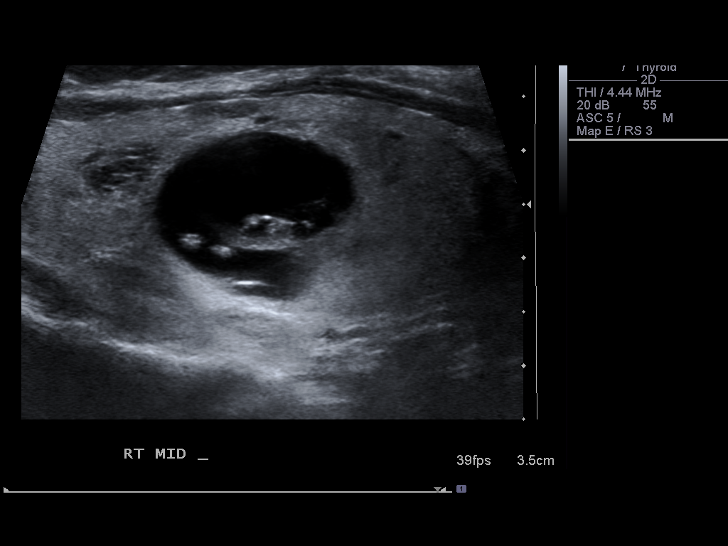
[im 7/27]
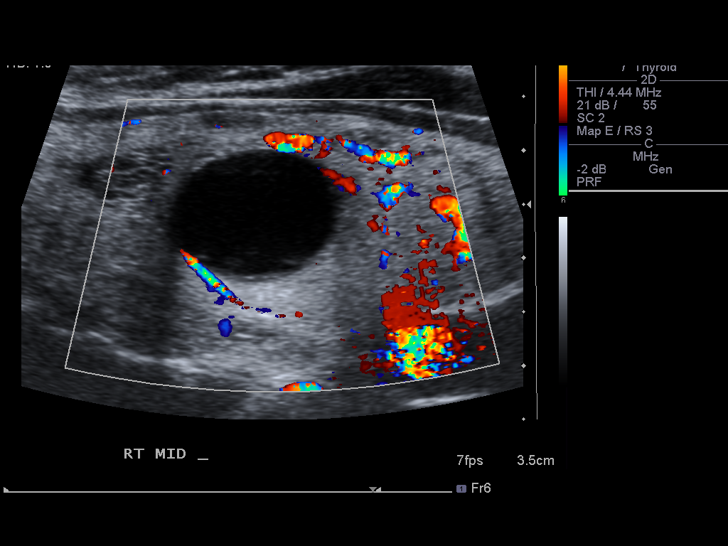
[im 9/27]
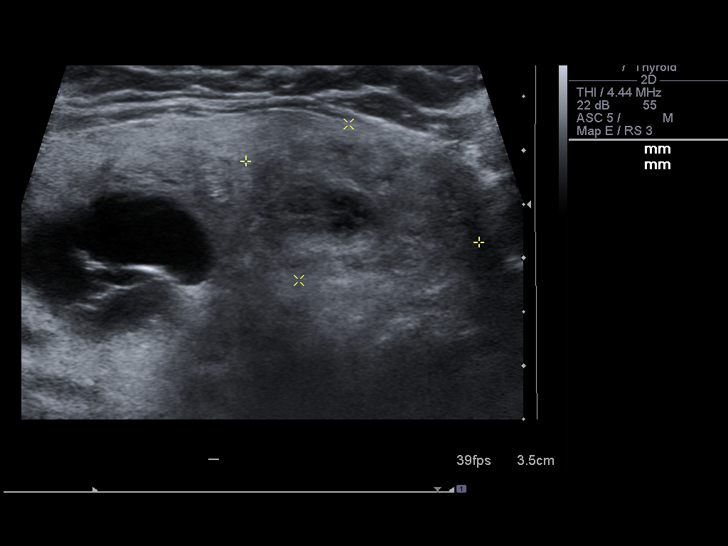
[im 11/27]
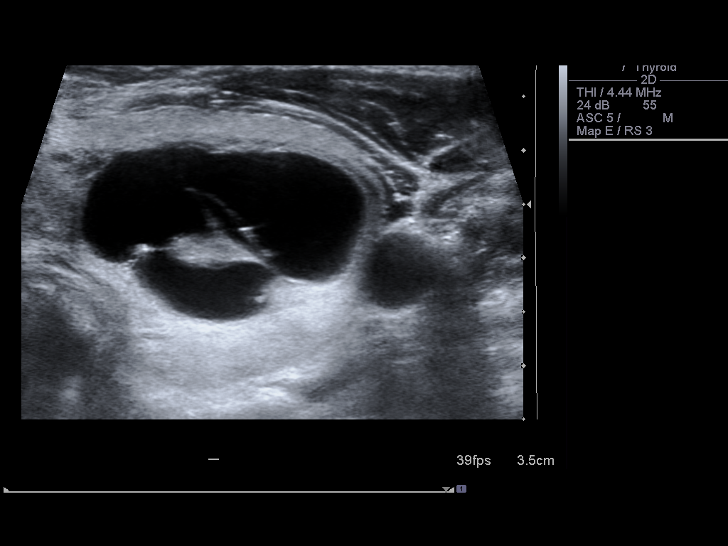
[im 14/27]
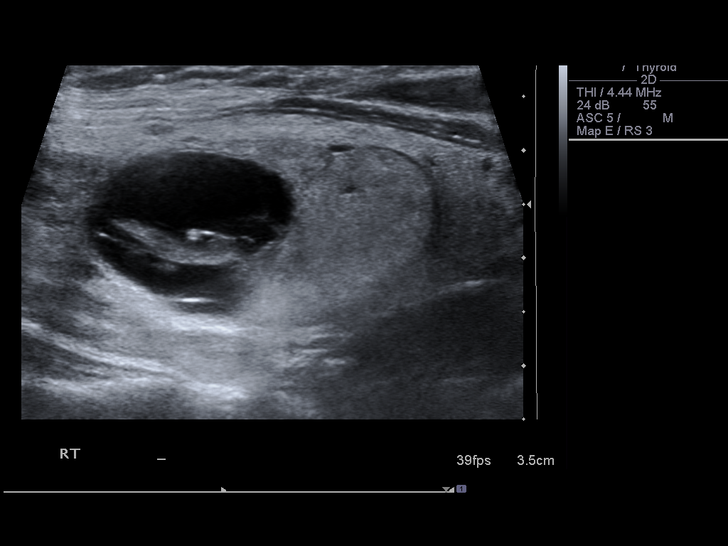
[im 16/27]
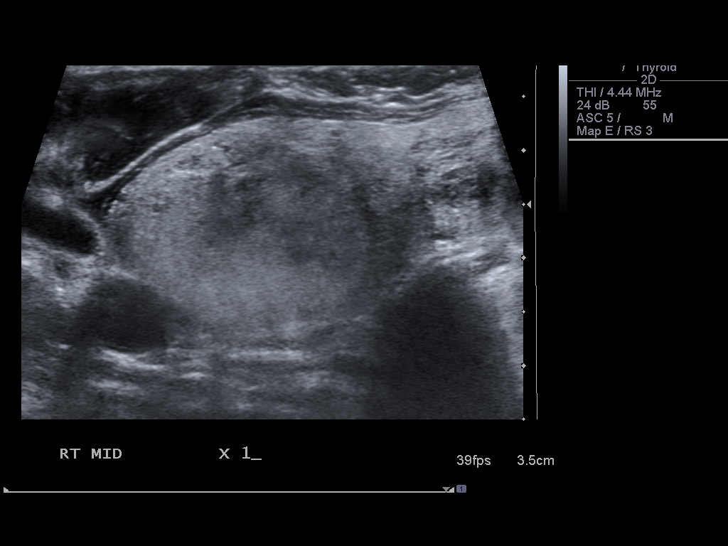
[im 18/27]
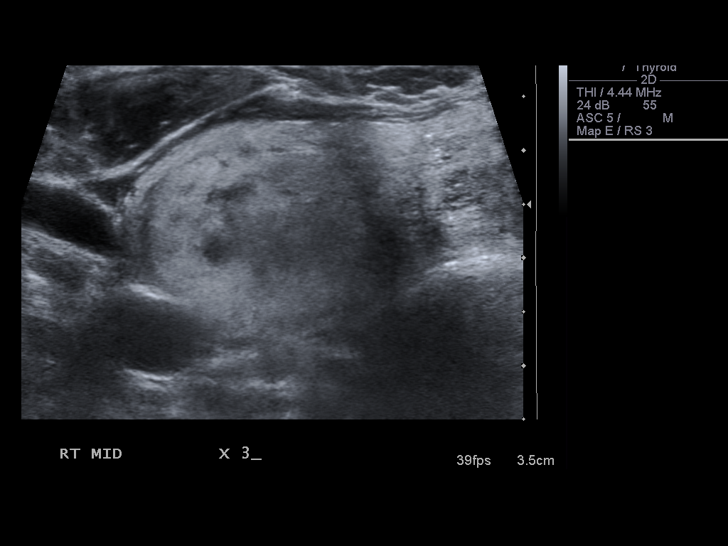
[im 20/27]
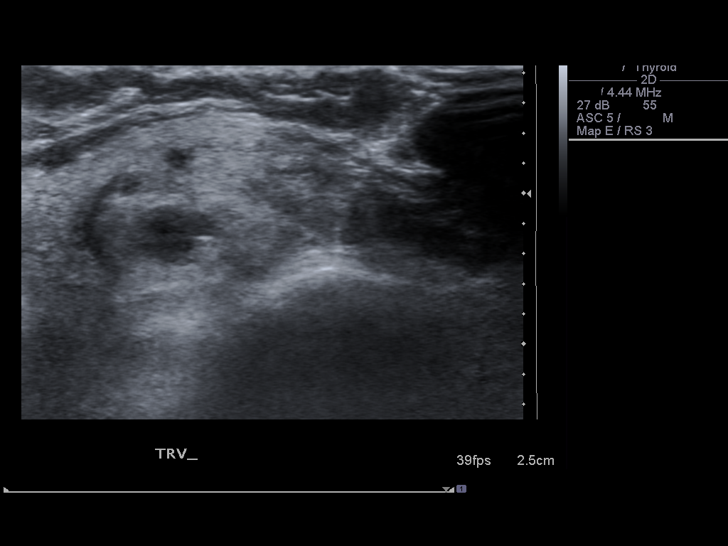
[im 22/27]
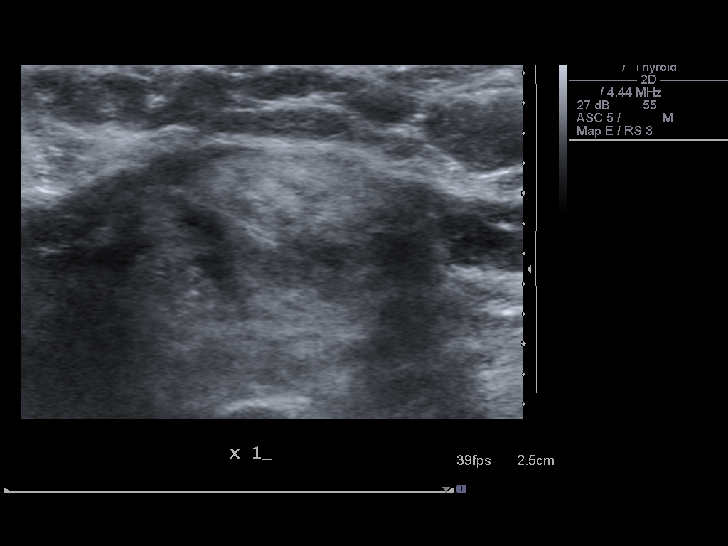
[im 24/27]
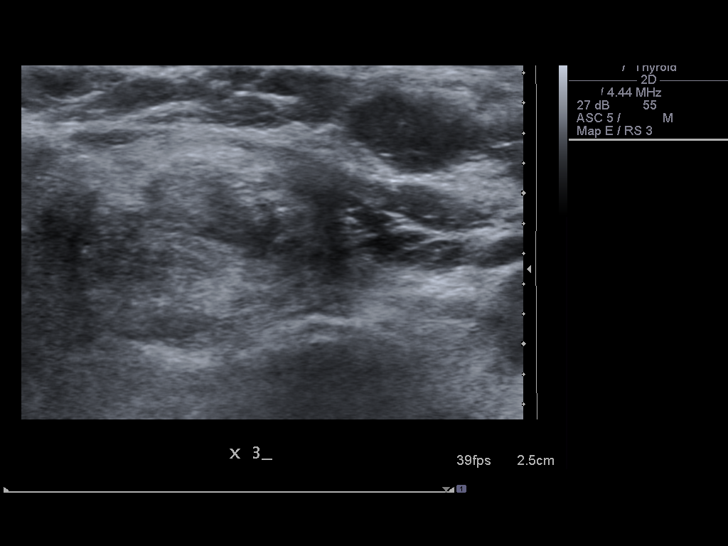
[im 27/27]
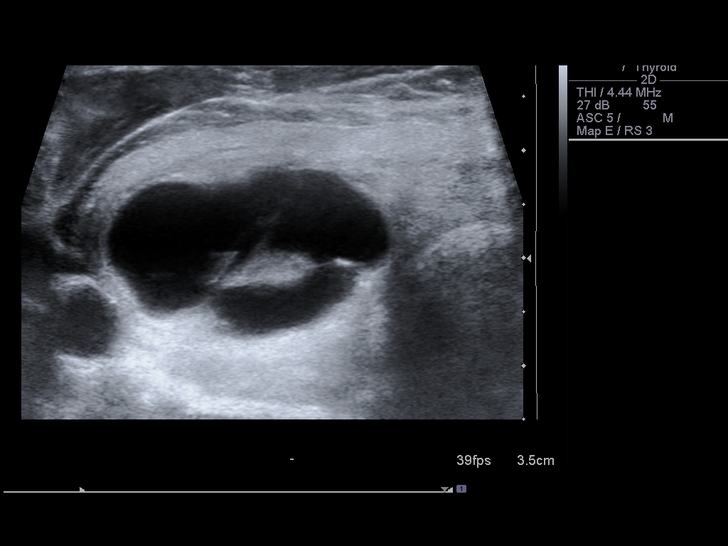

[13 of 25 positions shown; findings below may reference images not displayed]

ULTRASOUND GUIDED THYROID FINE NEEDLE ASPIRATION

Comparisons: Thyroid Ultrasound - 07/19/2011; chest CT - 07/10/2011

Intravenous Medications: None

Complications: None immediate

Technique / Findings:

Informed written consent was obtained from the patient after a
discussion of the risks, benefits and alternatives to treatment.
Questions regarding the procedure were encouraged and answered.  A
timeout was performed prior to the initiation of the procedure.

Pre-procedural ultrasound scanning demonstrated the dominant,
partially cystic, partially solid nodule encompassing the majority
of the mid/inferior aspect of the right lobe of the thyroid,
grossly unchanged.  Additional ultrasound scanning also
demonstrates a grossly unchanged mixed echogenic solid
approximately 2.0 cm nodule within the left aspect of the thyroid
isthmus.  The procedure was planned.

The neck was prepped in the usual sterile fashion, and a sterile
drape was applied covering the operative field.  A timeout was
performed prior to the initiation of the procedure.  Local
anesthesia was provided with 1% lidocaine.

Initial attention was paid to the dominant nodule within the
mid/inferior aspect the right lobe of the thyroid.  Under
ultrasound guidance, 4 FNA biopsies were performed of solid
component of the nodule with a 25 gauge needle. This included the
aspiration of a small (<1 ml) amount of serous fluid from the
cystic component of the nodule.  The samples were prepared and
submitted to pathology.  Limited post procedural scanning was
negative for hematoma or additional complication.

Attention was now paid to the dominant nodule within the left
aspect of the thyroid isthmus.  Under direct ultrasound guidance, 3
FMA biopsies were performed of the isthmus nodule with a 25 gauge
needle.  The samples were prepared and submitted to pathology.
Limited post procedure scan was negative for hematoma or additional
complication.

Dressings were placed.  The patient tolerated procedure well
without immediate postprocedural complication.
IMPRESSION: Technically successful ultrasound guided fine needle aspiration of
dominant nodules within the right lobe of the thyroid and the
thyroid isthmus.

## 2012-09-17 ENCOUNTER — Ambulatory Visit (INDEPENDENT_AMBULATORY_CARE_PROVIDER_SITE_OTHER): Payer: Medicare Other | Admitting: Family Medicine

## 2012-09-17 VITALS — BP 120/72 | HR 73 | Temp 98.2°F | Ht 63.0 in | Wt 133.8 lb

## 2012-09-17 DIAGNOSIS — M25519 Pain in unspecified shoulder: Secondary | ICD-10-CM

## 2012-09-17 DIAGNOSIS — M25511 Pain in right shoulder: Secondary | ICD-10-CM

## 2012-09-17 DIAGNOSIS — R490 Dysphonia: Secondary | ICD-10-CM

## 2012-09-17 DIAGNOSIS — S139XXA Sprain of joints and ligaments of unspecified parts of neck, initial encounter: Secondary | ICD-10-CM

## 2012-09-17 DIAGNOSIS — S161XXA Strain of muscle, fascia and tendon at neck level, initial encounter: Secondary | ICD-10-CM | POA: Insufficient documentation

## 2012-09-17 NOTE — Assessment & Plan Note (Signed)
Unable to  Tolerate nasal steroid. Oral antihistamine did not help.  Refer to ENT given smoking history.

## 2012-09-17 NOTE — Assessment & Plan Note (Signed)
No clear sign of radiculopathy.  Restart NSAIDs, start home PT. Info given.

## 2012-09-17 NOTE — Assessment & Plan Note (Signed)
Improved with NSAIDs.

## 2012-09-17 NOTE — Patient Instructions (Addendum)
Start home physical therapy exercise for neck pain. Restart meloxicam for inflammation and pain. Schedule an appointment with ENT about hoarseness.

## 2012-09-17 NOTE — Progress Notes (Signed)
Subjective:    Patient ID: Amy Stone, female    DOB: 04/08/1941, 72 y.o.   MRN: [id number]  HPI  72 year old female presents for follow up right shoulder pain and hoarsness. She was seen on 5/20 for several weeks of these issues.  CXR was clear. She is a longtime former smoker. Treated with nasal steroid and zyrtec at bedtime. She reports she was not tolerate nasal spray.   Started on meloxicam for possible rotator cuff pathology.   Today she reports she felt zyrtec did not help much after taking it for a week. She remains just as hoarse as previously.  She feels right shoulder pain is better, but now some pain in right posterior neck. Pain is intermittant, worse when turning head to right. None now. She used 4-5 days of meloxicam. She has not been doing any home PT.  No weakness in right arm, occ tingling in arm randomly.     Review of Systems  Constitutional: Negative for fever and fatigue.  HENT: Negative for ear pain.   Eyes: Negative for pain.  Respiratory: Negative for chest tightness and shortness of breath.   Cardiovascular: Negative for chest pain, palpitations and leg swelling.  Gastrointestinal: Negative for abdominal pain.  Genitourinary: Negative for dysuria.       Objective:   Physical Exam  Constitutional: She is oriented to person, place, and time. Vital signs are normal. She appears well-developed and well-nourished. She is cooperative.  Non-toxic appearance. She does not appear ill. No distress.  Hoarse voice.  HENT:  Head: Normocephalic.  Right Ear: Hearing, tympanic membrane, external ear and ear canal normal. Tympanic membrane is not erythematous, not retracted and not bulging.  Left Ear: Hearing, tympanic membrane, external ear and ear canal normal. Tympanic membrane is not erythematous, not retracted and not bulging.  Nose: No mucosal edema or rhinorrhea. Right sinus exhibits no maxillary sinus tenderness and no frontal sinus tenderness. Left  sinus exhibits no maxillary sinus tenderness and no frontal sinus tenderness.  Mouth/Throat: Uvula is midline, oropharynx is clear and moist and mucous membranes are normal.  Eyes: Conjunctivae, EOM and lids are normal. Pupils are equal, round, and reactive to light. No foreign bodies found.  Neck: Trachea normal and normal range of motion. Neck supple. Carotid bruit is not present. No mass and no thyromegaly present.  Cardiovascular: Normal rate, regular rhythm, S1 normal, S2 normal, normal heart sounds, intact distal pulses and normal pulses.  Exam reveals no gallop and no friction rub.   No murmur heard. Pulmonary/Chest: Effort normal and breath sounds normal. Not tachypneic. No respiratory distress. She has no decreased breath sounds. She has no wheezes. She has no rhonchi. She has no rales.  Abdominal: Soft. Normal appearance and bowel sounds are normal. There is no tenderness.  Musculoskeletal:       Right shoulder: Normal.       Left shoulder: Normal.       Cervical back: She exhibits tenderness. She exhibits normal range of motion, no bony tenderness, no deformity, no pain and no spasm.  Neurological: She is alert and oriented to person, place, and time. She has normal strength. No sensory deficit. She exhibits normal muscle tone.  Skin: Skin is warm, dry and intact. No rash noted.  Psychiatric: Her speech is normal and behavior is normal. Judgment and thought content normal. Her mood appears not anxious. Cognition and memory are normal. She does not exhibit a depressed mood.  Assessment & Plan:

## 2012-10-22 ENCOUNTER — Ambulatory Visit (INDEPENDENT_AMBULATORY_CARE_PROVIDER_SITE_OTHER): Payer: Medicare Other | Admitting: Cardiovascular Disease

## 2012-10-22 ENCOUNTER — Encounter: Payer: Self-pay | Admitting: Cardiovascular Disease

## 2012-10-22 ENCOUNTER — Telehealth: Payer: Self-pay

## 2012-10-22 VITALS — BP 110/70 | HR 65 | Ht 65.0 in | Wt 134.8 lb

## 2012-10-22 DIAGNOSIS — I10 Essential (primary) hypertension: Secondary | ICD-10-CM

## 2012-10-22 DIAGNOSIS — R079 Chest pain, unspecified: Secondary | ICD-10-CM | POA: Insufficient documentation

## 2012-10-22 DIAGNOSIS — M810 Age-related osteoporosis without current pathological fracture: Secondary | ICD-10-CM

## 2012-10-22 DIAGNOSIS — E785 Hyperlipidemia, unspecified: Secondary | ICD-10-CM

## 2012-10-22 DIAGNOSIS — I2510 Atherosclerotic heart disease of native coronary artery without angina pectoris: Secondary | ICD-10-CM

## 2012-10-22 DIAGNOSIS — I714 Abdominal aortic aneurysm, without rupture: Secondary | ICD-10-CM

## 2012-10-22 NOTE — Assessment & Plan Note (Signed)
Blood pressure is well controlled on today's visit. No changes made to the medications. 

## 2012-10-22 NOTE — Patient Instructions (Addendum)
You are doing well. No medication changes were made.  If your chest pain gets worse, call the office for a stress test  Please call us if you have new issues that need to be addressed before your next appt.  Your physician wants you to follow-up in: 6 months.  You will receive a reminder letter in the mail two months in advance. If you don't receive a letter, please call our office to schedule the follow-up appointment.

## 2012-10-22 NOTE — Assessment & Plan Note (Signed)
Cholesterol is at goal on the current lipid regimen. No changes to the medications were made.  

## 2012-10-22 NOTE — Assessment & Plan Note (Signed)
Status post repair. We'll continue aggressive cholesterol management. She has stopped smoking.

## 2012-10-22 NOTE — Assessment & Plan Note (Signed)
Musculoskeletal type chest pain on today's visit. Likely from overactivity. We have suggested she could try very low doses of NSAIDs. Instructed her to limit her activities that she reports that she has more "canning and peeling" to do. I suggested that she cough [place] if symptoms get worse. We did do a stress test if indicated. Symptoms are different from her previous anginal symptoms. No EKG changes.

## 2012-10-22 NOTE — Telephone Encounter (Signed)
Pt called saying she had an episode of "bad" chest pain all day yesterday associated with arm and neck pain She was canning beans and had to let her sister take over She has a pretty significant cardiac history (CAD, CABG, AAA, etc) and has not been seen since January 2013 She denies active pain/discomfort but is concerned about the symptoms she had yesterday I advised she come in to be seen today Worked in for 1015 this am

## 2012-10-22 NOTE — Assessment & Plan Note (Signed)
She has chronic neck pain on the right, now with thoracic back discomfort radiating around the front. She may need designated back x-rays to look for degenerative disc disease.

## 2012-10-22 NOTE — Progress Notes (Signed)
Patient ID: Amy Stone, female    DOB: 20-Oct-1940, 72 y.o.   MRN: [id number]  HPI Comments: Amy Stone is a very pleasant 72 year old woman with a history of coronary artery disease, bypass x4 in 2007, old inferior MI, abdominal aortic aneurysm measuring 5 cm as well as severe iliac arterial disease, previously evaluated for shortness of breath and chest pain. She underwent abdominal aortic aneurysm repair in March 2014.  She states that she had a long recovery. Since then she has been doing relatively well. She denies any of her typical anginal pain which is burning in her upper chest. She does report a pain that developed yesterday and continues today it seems to come and go with movement of her arms. She describes it as a cramping that radiates under both breasts and wraps around to her back. If she coughs, it "grabs" her with a sharp pain. Certain maneuvers such as bending over causes more discomfort. She has tried Tylenol with no improvement.  She states that she has been very active, canning lots of tomatoes and other vegetables in the past several days. She's been peeling and lifting. No symptoms with walking or exertion, only with movement of her upper extremities. Also with lots of thoracic back pain with certain maneuvers.  EKG shows normal sinus rhythm with rate 65 beats per minute with no significant ST or T wave changes   Outpatient Encounter Prescriptions as of 10/22/2012  Medication Sig Dispense Refill  . aspirin EC 81 MG tablet Take 81 mg by mouth daily.      [name] atenolol (TENORMIN) 25 MG tablet Take 1 tablet (25 mg total) by mouth daily.  90 tablet  3  . benazepril (LOTENSIN) 10 MG tablet Take 1 tablet (10 mg total) by mouth daily.  90 tablet  3  . levothyroxine (SYNTHROID, LEVOTHROID) 50 MCG tablet Take 0.5 tablets (25 mcg total) by mouth daily. Takes one half (25mg ) of a 50mg  tablet daily.  90 tablet  0  . simvastatin (ZOCOR) 80 MG tablet Take 1 tablet (80 mg total) by mouth at  bedtime.  90 tablet  3  . tiotropium (SPIRIVA) 18 MCG inhalation capsule Place 1 capsule (18 mcg total) into inhaler and inhale daily.  30 capsule  11   No facility-administered encounter medications on file as of 10/22/2012.     Review of Systems  Constitutional: Negative.   HENT: Negative.   Eyes: Negative.   Cardiovascular: Positive for chest pain.  Gastrointestinal: Negative.   Musculoskeletal: Positive for back pain.  Skin: Negative.   Neurological: Negative.   Psychiatric/Behavioral: Negative.   All other systems reviewed and are negative.    BP 110/70  Pulse 65  Ht 5\' 5"  (1.651 m)  Wt 134 lb 12 oz (61.122 kg)  BMI 22.42 kg/m2  Physical Exam  Nursing note and vitals reviewed. Constitutional: She is oriented to person, place, and time. She appears well-developed and well-nourished.  HENT:  Head: Normocephalic.  Nose: Nose normal.  Mouth/Throat: Oropharynx is clear and moist.  Eyes: Conjunctivae are normal. Pupils are equal, round, and reactive to light.  Neck: Normal range of motion. Neck supple. No JVD present. Carotid bruit is present.  Cardiovascular: Normal rate, regular rhythm, S1 normal, S2 normal, normal heart sounds and intact distal pulses.  Exam reveals no gallop and no friction rub.   No murmur heard. Pulses:      Carotid pulses are 1+ on the right side, and 1+ on the left side.  Radial pulses are 1+ on the right side, and 1+ on the left side.       Femoral pulses are 1+ on the right side, and 1+ on the left side.      Dorsalis pedis pulses are 0 on the right side, and 0 on the left side.       Posterior tibial pulses are 0 on the right side, and 0 on the left side.  Pulmonary/Chest: Effort normal and breath sounds normal. No respiratory distress. She has no wheezes. She has no rales. She exhibits no tenderness.  Abdominal: Soft. Bowel sounds are normal. She exhibits no distension. There is no tenderness.  Musculoskeletal: Normal range of motion. She  exhibits no edema and no tenderness.  Lymphadenopathy:    She has no cervical adenopathy.  Neurological: She is alert and oriented to person, place, and time. Coordination normal.  Skin: Skin is warm and dry. No rash noted. No erythema.  Psychiatric: She has a normal mood and affect. Her behavior is normal. Judgment and thought content normal.    Assessment and Plan

## 2012-10-22 NOTE — Assessment & Plan Note (Signed)
Attention, recent chest pain is atypical in nature. Stress test to be ordered for worsening symptoms. Would do a lexiscan Myoview if needed.

## 2012-12-18 ENCOUNTER — Ambulatory Visit (INDEPENDENT_AMBULATORY_CARE_PROVIDER_SITE_OTHER): Payer: Medicare Other | Admitting: Family Medicine

## 2012-12-18 ENCOUNTER — Encounter: Payer: Self-pay | Admitting: Family Medicine

## 2012-12-18 VITALS — BP 110/68 | HR 65 | Temp 97.8°F | Wt 132.0 lb

## 2012-12-18 DIAGNOSIS — K625 Hemorrhage of anus and rectum: Secondary | ICD-10-CM

## 2012-12-18 DIAGNOSIS — K921 Melena: Secondary | ICD-10-CM | POA: Insufficient documentation

## 2012-12-18 NOTE — Patient Instructions (Addendum)
Go to the lab on the way out.  We'll contact you with your lab report.  I would eat a liquid diet for a day or so.  Notify us if you have more bleeding.  Avoid straining on the toilet.  If you have no more blood in your stool and you feel well, then gradually go back to a regular diet.  Take care.

## 2012-12-18 NOTE — Progress Notes (Signed)
She was picking peas yesterday and then she felt funny w/o trigger known.  The longer she stood in the garden she worst she felt.   She went to the house and got in the shade, drank some water.  She felt better.  She then had diarrhea and saw some blood on the toilet paper.  She had an episode bloody flatus this AM.  She has some occ intermittent lower abd pain. No FCNAV.  Appetite is still okay.  Prev colonoscopy with polyps but o/w unremarkable. H/o AORTA - BILATERAL FEMORAL ARTERY BYPASS GRAFT 2013.   No diarrhea since yesterday.  No syncope.  SOB at baseline, using her inhaler at baseline with some relief.  No CP.    Not lightheaded now.  This only happens rarely.    No abd pain now.    Meds, vitals, and allergies reviewed.   ROS: See HPI.  Otherwise, noncontributory.  nad ncat Mmm rrr ctab abd soft, not ttp, normal BS Nonthrombosed hemorrhoids noted.

## 2012-12-18 NOTE — Assessment & Plan Note (Signed)
Only recently, not lightheaded.  Likely irritation of hemorrhoids from diarrhea.  Unremarkable exam o/w.  Would check CBC and have her update [place] if sx continue.  She agrees. Benign abd exam.  Routed to PCP as FYI.

## 2012-12-19 LAB — CBC WITH DIFFERENTIAL/PLATELET
Basophils Relative: 0.5 % (ref 0.0–3.0)
Eosinophils Absolute: 0.2 10*3/uL (ref 0.0–0.7)
Eosinophils Relative: 1.5 % (ref 0.0–5.0)
Hemoglobin: 11.9 g/dL — ABNORMAL LOW (ref 12.0–15.0)
Lymphocytes Relative: 23.1 % (ref 12.0–46.0)
MCHC: 33.3 g/dL (ref 30.0–36.0)
Neutro Abs: 8.5 10*3/uL — ABNORMAL HIGH (ref 1.4–7.7)
Neutrophils Relative %: 68.7 % (ref 43.0–77.0)
RBC: 4.08 Mil/uL (ref 3.87–5.11)
WBC: 12.4 10*3/uL — ABNORMAL HIGH (ref 4.5–10.5)

## 2013-01-21 ENCOUNTER — Ambulatory Visit (INDEPENDENT_AMBULATORY_CARE_PROVIDER_SITE_OTHER): Payer: Medicare Other

## 2013-01-21 DIAGNOSIS — Z23 Encounter for immunization: Secondary | ICD-10-CM

## 2013-01-23 ENCOUNTER — Ambulatory Visit (INDEPENDENT_AMBULATORY_CARE_PROVIDER_SITE_OTHER): Payer: Medicare Other | Admitting: Family Medicine

## 2013-01-23 ENCOUNTER — Encounter: Payer: Self-pay | Admitting: Family Medicine

## 2013-01-23 VITALS — BP 122/60 | HR 61 | Temp 98.1°F | Ht 63.0 in | Wt 132.8 lb

## 2013-01-23 DIAGNOSIS — Z2911 Encounter for prophylactic immunotherapy for respiratory syncytial virus (RSV): Secondary | ICD-10-CM

## 2013-01-23 DIAGNOSIS — Z23 Encounter for immunization: Secondary | ICD-10-CM

## 2013-01-23 DIAGNOSIS — H8110 Benign paroxysmal vertigo, unspecified ear: Secondary | ICD-10-CM

## 2013-01-23 DIAGNOSIS — I10 Essential (primary) hypertension: Secondary | ICD-10-CM

## 2013-01-23 NOTE — Assessment & Plan Note (Signed)
Home desensitization exercsies given. Can use meclizine prn. Follow up in 2 weeks if not improved.  No sign of neurologic change.

## 2013-01-23 NOTE — Assessment & Plan Note (Signed)
Well controlled. Continue current medication.  

## 2013-01-23 NOTE — Patient Instructions (Addendum)
Schedule medicare wellness visit with fasting labs prior. Shingles given today. Start home vertigo treatments.. Can use meclizine for severe symptoms.  Follow up if not improving in 2 weeks.

## 2013-01-23 NOTE — Progress Notes (Signed)
  Subjective:    Patient ID: Amy Stone, female    DOB: 08-24-1940, 72 y.o.   MRN: [id number]  HPI  72 year old female presents to discuss forms from house calls.. Nurse from insurance that comes out. Nurse came out on 10/2.  They recommended shingles and ? If she was due for labs.  Hypertension:  Well controlled on lotensin and atenolol. Using medication without problems or lightheadedness: occ  Chest pain with exertion:None Edema:None Short of breath:None Average home BPs: Other issues:  In last week she has been feeling vertigo when she moves head to side.  worse when she lies down or turning head to right  No associated neuro changes. Review of Systems  Constitutional: Negative for fever and fatigue.  HENT: Negative for ear pain.   Eyes: Negative for pain.  Respiratory: Negative for chest tightness and shortness of breath.   Cardiovascular: Negative for chest pain, palpitations and leg swelling.  Gastrointestinal: Negative for abdominal pain.  Genitourinary: Negative for dysuria.    Occ shoulder pain and neck pain, occ headache. Has history of migraines.    Objective:   Physical Exam  Constitutional: She is oriented to person, place, and time. Vital signs are normal. She appears well-developed and well-nourished. She is cooperative.  Non-toxic appearance. She does not appear ill. No distress.  HENT:  Head: Normocephalic.  Right Ear: Hearing, tympanic membrane, external ear and ear canal normal. Tympanic membrane is not erythematous, not retracted and not bulging.  Left Ear: Hearing, tympanic membrane, external ear and ear canal normal. Tympanic membrane is not erythematous, not retracted and not bulging.  Nose: No mucosal edema or rhinorrhea. Right sinus exhibits no maxillary sinus tenderness and no frontal sinus tenderness. Left sinus exhibits no maxillary sinus tenderness and no frontal sinus tenderness.  Mouth/Throat: Uvula is midline, oropharynx is clear and moist  and mucous membranes are normal.  Eyes: Conjunctivae, EOM and lids are normal. Pupils are equal, round, and reactive to light. Lids are everted and swept, no foreign bodies found.  Neck: Trachea normal and normal range of motion. Neck supple. Carotid bruit is not present. No mass and no thyromegaly present.  Cardiovascular: Normal rate, regular rhythm, S1 normal, S2 normal, normal heart sounds, intact distal pulses and normal pulses.  Exam reveals no gallop and no friction rub.   No murmur heard. Pulmonary/Chest: Effort normal and breath sounds normal. Not tachypneic. No respiratory distress. She has no decreased breath sounds. She has no wheezes. She has no rhonchi. She has no rales.  Abdominal: Soft. Normal appearance and bowel sounds are normal. There is no tenderness.  Neurological: She is alert and oriented to person, place, and time. She has normal strength. No cranial nerve deficit or sensory deficit. She displays a negative Romberg sign.  Nystagmus slight with dix hallpike  Skin: Skin is warm, dry and intact. No rash noted.  Psychiatric: Her speech is normal and behavior is normal. Judgment and thought content normal. Her mood appears not anxious. Cognition and memory are normal. She does not exhibit a depressed mood.          Assessment & Plan:

## 2013-03-31 ENCOUNTER — Telehealth: Payer: Self-pay | Admitting: Family Medicine

## 2013-03-31 ENCOUNTER — Other Ambulatory Visit (INDEPENDENT_AMBULATORY_CARE_PROVIDER_SITE_OTHER): Payer: Medicare Other

## 2013-03-31 DIAGNOSIS — K921 Melena: Secondary | ICD-10-CM

## 2013-03-31 DIAGNOSIS — M810 Age-related osteoporosis without current pathological fracture: Secondary | ICD-10-CM

## 2013-03-31 DIAGNOSIS — E039 Hypothyroidism, unspecified: Secondary | ICD-10-CM

## 2013-03-31 DIAGNOSIS — E785 Hyperlipidemia, unspecified: Secondary | ICD-10-CM

## 2013-03-31 LAB — COMPREHENSIVE METABOLIC PANEL
ALT: 15 U/L (ref 0–35)
AST: 22 U/L (ref 0–37)
Alkaline Phosphatase: 70 U/L (ref 39–117)
BUN: 16 mg/dL (ref 6–23)
Calcium: 9.5 mg/dL (ref 8.4–10.5)
Chloride: 103 mEq/L (ref 96–112)
Creatinine, Ser: 1 mg/dL (ref 0.4–1.2)
Total Bilirubin: 0.7 mg/dL (ref 0.3–1.2)

## 2013-03-31 LAB — LIPID PANEL
HDL: 48.2 mg/dL (ref >39.00)
Total CHOL/HDL Ratio: 3
Triglycerides: 100 mg/dL (ref 0.0–149.0)
VLDL: 20 mg/dL (ref 0.0–40.0)

## 2013-03-31 LAB — CBC WITH DIFFERENTIAL/PLATELET
Basophils Absolute: 0 10*3/uL (ref 0.0–0.1)
Basophils Relative: 0.3 % (ref 0.0–3.0)
Eosinophils Absolute: 0.2 10*3/uL (ref 0.0–0.7)
HCT: 38.1 % (ref 36.0–46.0)
Hemoglobin: 12.6 g/dL (ref 12.0–15.0)
Lymphs Abs: 3 10*3/uL (ref 0.7–4.0)
MCHC: 33.2 g/dL (ref 30.0–36.0)
MCV: 87.7 fl (ref 78.0–100.0)
Monocytes Absolute: 0.8 10*3/uL (ref 0.1–1.0)
Neutro Abs: 6.1 10*3/uL (ref 1.4–7.7)
RBC: 4.34 Mil/uL (ref 3.87–5.11)
RDW: 15 % — ABNORMAL HIGH (ref 11.5–14.6)

## 2013-03-31 LAB — TSH: TSH: 0.28 u[IU]/mL — ABNORMAL LOW (ref 0.35–5.50)

## 2013-03-31 NOTE — Telephone Encounter (Signed)
Message copied by Excell Seltzer on Mon Mar 31, 2013  8:19 AM ------      Message from: Alvina Chou      Created: Fri Mar 21, 2013  2:56 PM      Regarding: Lab orders for Monday,12.15.14       Patient is scheduled for CPX labs, please order future labs, Thanks , Terri       ------

## 2013-04-01 ENCOUNTER — Encounter: Payer: Self-pay | Admitting: Family

## 2013-04-01 ENCOUNTER — Ambulatory Visit: Payer: Medicare Other

## 2013-04-01 DIAGNOSIS — E039 Hypothyroidism, unspecified: Secondary | ICD-10-CM

## 2013-04-02 ENCOUNTER — Ambulatory Visit (INDEPENDENT_AMBULATORY_CARE_PROVIDER_SITE_OTHER): Payer: Medicare Other | Admitting: Family

## 2013-04-02 ENCOUNTER — Encounter: Payer: Self-pay | Admitting: Family

## 2013-04-02 VITALS — BP 125/63 | HR 67 | Resp 14 | Ht 64.0 in | Wt 133.0 lb

## 2013-04-02 DIAGNOSIS — I714 Abdominal aortic aneurysm, without rupture: Secondary | ICD-10-CM

## 2013-04-02 DIAGNOSIS — Z48812 Encounter for surgical aftercare following surgery on the circulatory system: Secondary | ICD-10-CM

## 2013-04-02 NOTE — Patient Instructions (Signed)
Peripheral Vascular Disease Peripheral Vascular Disease (PVD), also called Peripheral Arterial Disease (PAD), is a circulation problem caused by cholesterol (atherosclerotic plaque) deposits in the arteries. PVD commonly occurs in the lower extremities (legs) but it can occur in other areas of the body, such as your arms. The cholesterol buildup in the arteries reduces blood flow which can cause pain and other serious problems. The presence of PVD can place a person at risk for Coronary Artery Disease (CAD).  CAUSES  Causes of PVD can be many. It is usually associated with more than one risk factor such as:   High Cholesterol.  Smoking.  Diabetes.  Lack of exercise or inactivity.  High blood pressure (hypertension).  Obesity.  Family history. SYMPTOMS   When the lower extremities are affected, patients with PVD may experience:  Leg pain with exertion or physical activity. This is called INTERMITTENT CLAUDICATION. This may present as cramping or numbness with physical activity. The location of the pain is associated with the level of blockage. For example, blockage at the abdominal level (distal abdominal aorta) may result in buttock or hip pain. Lower leg arterial blockage may result in calf pain.  As PVD becomes more severe, pain can develop with less physical activity.  In people with severe PVD, leg pain may occur at rest.  Other PVD signs and symptoms:  Leg numbness or weakness.  Coldness in the affected leg or foot, especially when compared to the other leg.  A change in leg color.  Patients with significant PVD are more prone to ulcers or sores on toes, feet or legs. These may take longer to heal or may reoccur. The ulcers or sores can become infected.  If signs and symptoms of PVD are ignored, gangrene may occur. This can result in the loss of toes or loss of an entire limb.  Not all leg pain is related to PVD. Other medical conditions can cause leg pain such  as:  Blood clots (embolism) or Deep Vein Thrombosis.  Inflammation of the blood vessels (vasculitis).  Spinal stenosis. DIAGNOSIS  Diagnosis of PVD can involve several different types of tests. These can include:  Pulse Volume Recording Method (PVR). This test is simple, painless and does not involve the use of X-rays. PVR involves measuring and comparing the blood pressure in the arms and legs. An ABI (Ankle-Brachial Index) is calculated. The normal ratio of blood pressures is 1. As this number becomes smaller, it indicates more severe disease.  < 0.95  indicates significant narrowing in one or more leg vessels.  <0.8 there will usually be pain in the foot, leg or buttock with exercise.  <0.4 will usually have pain in the legs at rest.  <0.25  usually indicates limb threatening PVD.  Doppler detection of pulses in the legs. This test is painless and checks to see if you have a pulses in your legs/feet.  A dye or contrast material (a substance that highlights the blood vessels so they show up on x-ray) may be given to help your caregiver better see the arteries for the following tests. The dye is eliminated from your body by the kidney's. Your caregiver may order blood work to check your kidney function and other laboratory values before the following tests are performed:  Magnetic Resonance Angiography (MRA). An MRA is a picture study of the blood vessels and arteries. The MRA machine uses a large magnet to produce images of the blood vessels.  Computed Tomography Angiography (CTA). A CTA is a   specialized x-ray that looks at how the blood flows in your blood vessels. An IV may be inserted into your arm so contrast dye can be injected.  Angiogram. Is a procedure that uses x-rays to look at your blood vessels. This procedure is minimally invasive, meaning a small incision (cut) is made in your groin. A small tube (catheter) is then inserted into the artery of your groin. The catheter is  guided to the blood vessel or artery your caregiver wants to examine. Contrast dye is injected into the catheter. X-rays are then taken of the blood vessel or artery. After the images are obtained, the catheter is taken out. TREATMENT  Treatment of PVD involves many interventions which may include:  Lifestyle changes:  Quitting smoking.  Exercise.  Following a low fat, low cholesterol diet.  Control of diabetes.  Foot care is very important to the PVD patient. Good foot care can help prevent infection.  Medication:  Cholesterol-lowering medicine.  Blood pressure medicine.  Anti-platelet drugs.  Certain medicines may reduce symptoms of Intermittent Claudication.  Interventional/Surgical options:  Angioplasty. An Angioplasty is a procedure that inflates a balloon in the blocked artery. This opens the blocked artery to improve blood flow.  Stent Implant. A wire mesh tube (stent) is placed in the artery. The stent expands and stays in place, allowing the artery to remain open.  Peripheral Bypass Surgery. This is a surgical procedure that reroutes the blood around a blocked artery to help improve blood flow. This type of procedure may be performed if Angioplasty or stent implants are not an option. SEEK IMMEDIATE MEDICAL CARE IF:   You develop pain or numbness in your arms or legs.  Your arm or leg turns cold, becomes blue in color.  You develop redness, warmth, swelling and pain in your arms or legs. MAKE SURE YOU:   Understand these instructions.  Will watch your condition.  Will get help right away if you are not doing well or get worse. Document Released: 05/11/2004 Document Revised: 06/26/2011 Document Reviewed: 04/07/2008 [employer] Patient Information 2014 [employer].  

## 2013-04-02 NOTE — Progress Notes (Addendum)
[facility] [place] HISTORY AND PHYSICAL -PAD  History of Present Illness Amy Stone is a 72 y.o. female patient of Dr. Edilia Bo status post AAA and aortic bifemoral bypass graft April 2013. She returns today for PAD follow up, she denies any claudication symptoms with walking any distance. Her legs do hurt with walking on concrete floor only. She denies non-healing wounds. No vascular lab studies were done today. She denies any stroke or TIA history. She does flexibility and strengthening exercises indoors in the cold months and is active outdoors in the warm months.   She did have a 4 vessel CABG in 2007 and an MI, she stopped smoking then.  She denies New Medical or Surgical History.  Pt Diabetic: No Pt smoker: former smoker, quit in 2007  Pt meds include: Statin :Yes Betablocker: Yes ASA: Yes Other anticoagulants/antiplatelets: no  Past Medical History  Diagnosis Date  . Epigastric pain   . Osteoporosis, unspecified   . Cervicalgia   . Unspecified tinnitus   . Neoplasm of uncertain behavior of skin   . Thoracic aneurysm, ruptured   . Other chest pain   . Breast screening, unspecified   . Unspecified essential hypertension   . Other and unspecified hyperlipidemia   . Unspecified hypothyroidism   . Osteoarthrosis, unspecified whether generalized or localized, unspecified site   . Chronic airway obstruction, not elsewhere classified   . Peripheral vascular disease   . Myocardial infarction 2007  . Coronary atherosclerosis of unspecified type of vessel, native or graft 2007  . PONV (postoperative nausea and vomiting)   . Shortness of breath   . Blood transfusion   . UTI (lower urinary tract infection)   . Cancer     Bladder  . Thyroid nodule     seen on CT Scan 07/19/2011  . AAA (abdominal aortic aneurysm)     Social History History  Substance Use Topics  . Smoking status: Former Smoker -- 1 years    Types: Cigarettes    Quit date:  11/24/2005  . Smokeless tobacco: Never Used     Comment: 40 pack years   . Alcohol Use: No    Family History Family History  Problem Relation Age of Onset  . Alzheimer's disease Father   . Parkinsonism Father   . Heart defect Mother     enlarged heart  . Heart attack Mother   . Hypertension Sister   . Diabetes Sister   . Heart disease Sister   . Hyperlipidemia Sister   . Hypothyroidism Sister   . Aneurysm Paternal Grandfather     Past Surgical History  Procedure Laterality Date  . Acute inferior mi  11/2005  . Port wine stain removal w/ laser  2004    bladder cancer  . Goiter removal  1979  . Coronary artery bypass graft  11/2005  . Aorta - bilateral femoral artery bypass graft  07/25/2011    Procedure: AORTA BIFEMORAL BYPASS GRAFT;  Surgeon: Chuck Hint, MD;  Location: [facility] OR;  Service: Vascular;  Laterality: Bilateral;  . Abdominal hysterectomy    . Pr vein bypass graft,aorto-fem-pop  July 25, 2011    Allergies  Allergen Reactions  . Iohexol      Code: HIVES, Desc: CONTRAST REACTION OF HIVES (LARGE WHELPS DEVELOPED OVER ENTIRE BODY/MMS   . Prednisone Other (See Comments)    hallucinations    Current Outpatient Prescriptions  Medication Sig Dispense Refill  . aspirin EC 81 MG tablet Take 81  mg by mouth daily.      [name] atenolol (TENORMIN) 25 MG tablet Take 1 tablet (25 mg total) by mouth daily.  90 tablet  3  . benazepril (LOTENSIN) 10 MG tablet Take 1 tablet (10 mg total) by mouth daily.  90 tablet  3  . levothyroxine (SYNTHROID, LEVOTHROID) 50 MCG tablet Take 25 mcg by mouth daily.      . simvastatin (ZOCOR) 80 MG tablet Take 1 tablet (80 mg total) by mouth at bedtime.  90 tablet  3  . tiotropium (SPIRIVA) 18 MCG inhalation capsule Place 1 capsule (18 mcg total) into inhaler and inhale daily.  30 capsule  11   No current facility-administered medications for this visit.    ROS:  See HPI for pertinent positives and negatives.    Physical  Examination  Filed Vitals:   04/02/13 1051  BP: 125/63  Pulse: 67  Resp: 14   Filed Weights   04/02/13 1051  Weight: 133 lb (60.328 kg)   Body mass index is 22.82 kg/(m^2).  General: A&O x 3, WDWN,  Gait: normal Eyes: PERRLA, Pulmonary: CTAB, without wheezes , rales or rhonchi Cardiac: regular Rhythm with occasional premature beats , without murmur          Carotid Bruits Left Right   Negative Negative  Aorta: is not palpable Radial pulses: are 2+ and =                           VASCULAR EXAM: Extremities without ischemic changes  without Gangrene; without open wounds.                                                                                                          LE Pulses LEFT RIGHT       FEMORAL  palpable   palpable        POPLITEAL  not palpable   not palpable       POSTERIOR TIBIAL  not palpable   not palpable        DORSALIS PEDIS      ANTERIOR TIBIAL 2+ palpable  2+ palpable    Abdomen: soft, NT, no masses. Skin: no rashes, no ulcers noted. Musculoskeletal: no muscle wasting or atrophy.  Neurologic: A&O X 3; Appropriate Affect ; SENSATION: normal; MOTOR FUNCTION:  moving all extremities equally, motor strength 5/5 throughout. Speech is fluent/normal. CN 2-12 intact.  ASSESSMENT: Amy Stone is a 72 y.o. female who presents status post aortic bifemoral bypass graft April 2013 for routine PAD follow up. She has no claudication symptoms, no skin ulcers. She exercises regularly. Her modifiable atherosclerotic risk factors are all under medical management or not present.  PLAN:  I discussed in depth with the patient the nature of atherosclerosis, and emphasized the importance of maximal medical management including strict control of blood pressure, blood glucose, and lipid levels, obtaining regular exercise, and continued cessation of smoking.  The patient is aware that without maximal medical management the underlying atherosclerotic disease  process will  progress, limiting the benefit of any interventions. Based on the patient's vascular studies and examination, pt will return to clinic in 1 year for ABI's.  The patient was given information about PAD including signs, symptoms, treatment, what symptoms should prompt the patient to seek immediate medical care, and risk reduction measures to take.  Charisse March, RN, MSN, FNP-C [facility] [place] Phone: [phone]  Clinic MD: Edilia Bo  04/02/2013 10:53 AM    VASCULAR QUALITY INITIATIVE FOLLOW UP DATA:  Current smoker: [  ] yes  [ X ] no  Living status: [ X ]  Home  [  ] Nursing home  [  ] Homeless    MEDS:  ASA Arly.Keller  ] yes  [  ] no- [  ] medical reason  [  ] non compliant  STATIN  Arly.Keller  ] yes  [  ] no- [  ] medical reason  [  ] non compliant  Beta blocker [ X ] yes  [  ] no- [  ] medical reason  [  ] non compliant  ACE inhibitor [  ] yes  [  ] no- [  ] medical reason  [  ] non compliant  P2Y12 Antagonist [ X ] none  [  ] clopidogrel-Plavix  [  ] ticlopidine-Ticlid   [  ] prasugrel-Effient  [  ] ticagrelor- Brilinta    Anticoagulant [ X ] None  [  ] warfarin  [  ] rivaroxaban-Xarelto [  ] dabigatran- Pradaxa  Ambulation: [ X ] Amb  [  ] Amb with assistance  [  ] wheelchair  [  ] bedridden  Ipsilateral Sx: Arly.Keller  ] none   [  ] claudication  [  ] rest pain  [  ] tissue loss  Current Patency: Arly.Keller  ] primary  [  ] primary-assisted  [  ] secondary  [  ] occluded  Patency judged by: [  ] doppler  [  ] palp graft pulse  Arly.Keller  ] palp distal pulse   [  ] ABI increase > 15%   [  ] Duplex  If occluded, when-   Ipsilateral ABI: not performed today  Ipsilateral TBI: not performed today  Infection: [ X ] none  [  ] cellulitis  [  ] deep abscess  [  ] infection of artery or graft  Bypass revision: [ X ] no  [  ] yes- [  ] surg  [  ] catheter based  [  ] both    Date:   Thrombectomy/ lysis/ revision:  Arly.Keller  ] no  [  ] yes- [  ] surg  [  ] catheter  based  [  ] both    Date:   Major amputation: Arly.Keller  ] no  [  ] minor amp  [  ] BKA  [  ] AKA   Date:

## 2013-04-03 ENCOUNTER — Encounter: Payer: Self-pay | Admitting: Family Medicine

## 2013-04-03 ENCOUNTER — Ambulatory Visit (INDEPENDENT_AMBULATORY_CARE_PROVIDER_SITE_OTHER): Payer: Medicare Other | Admitting: Family Medicine

## 2013-04-03 VITALS — BP 130/60 | HR 53 | Temp 98.1°F | Ht 65.0 in | Wt 132.5 lb

## 2013-04-03 DIAGNOSIS — E785 Hyperlipidemia, unspecified: Secondary | ICD-10-CM

## 2013-04-03 DIAGNOSIS — I10 Essential (primary) hypertension: Secondary | ICD-10-CM

## 2013-04-03 DIAGNOSIS — E039 Hypothyroidism, unspecified: Secondary | ICD-10-CM

## 2013-04-03 DIAGNOSIS — M810 Age-related osteoporosis without current pathological fracture: Secondary | ICD-10-CM

## 2013-04-03 DIAGNOSIS — Z0000 Encounter for general adult medical examination without abnormal findings: Secondary | ICD-10-CM

## 2013-04-03 NOTE — Progress Notes (Signed)
Pre-visit discussion using our clinic review tool. No additional management support is needed unless otherwise documented below in the visit note.  

## 2013-04-03 NOTE — Assessment & Plan Note (Signed)
Well controlled. Continue current medication.  

## 2013-04-03 NOTE — Patient Instructions (Addendum)
Call to schedule mammogram on your own.  Work on [employer] and regular exercise.

## 2013-04-03 NOTE — Progress Notes (Signed)
HPI  I have personally reviewed the Medicare Annual Wellness questionnaire and have noted  1. The patient's medical and social history  2. Their use of alcohol, tobacco or illicit drugs  3. Their current medications and supplements  4. The patient's functional ability including ADL's, fall risks, home safety risks and hearing or visual  impairment.  5. Diet and physical activities  6. Evidence for depression or mood disorders  The patients weight, height, BMI and visual acuity have been recorded in the chart  I have made referrals, counseling and provided education to the patient based review of the above and I have provided the pt with a written personalized care plan for preventive services.   Hypertension: Well controlled on current meds: atenolol, lotensin.   BP Readings from Last 3 Encounters:  04/03/13 130/60  04/02/13 125/63  01/23/13 122/60  Using medication without problems or lightheadedness: Noe  Chest pain with exertion:None  Edema:None  Short of breath:mild  Average home BPs: well controlled  Other issues:   Also reviewed OV by Dr. Mariah Milling [facility].   Elevated Cholesterol: LDL at goal <70 on simvastatin 80 mg daily  Lab Results  Component Value Date   CHOL 123 03/31/2013   HDL 48.20 03/31/2013   LDLCALC 55 03/31/2013   TRIG 100.0 03/31/2013   CHOLHDL 3 03/31/2013  Using medications without problems:None  Muscle aches: None  Diet compliance:Eating a lot of fresh fruit and veggies.  Exercise: walks a lot, cutting wood  Other complaints:   Hypothyroid: Stable control on current dose  Lab Results  Component Value Date   TSH 0.28* 03/31/2013  Free t3 and free t4 at GOAL  COPD, on spiriva: Daily mild SOB with exertion despite this. No daily cough at baseline.   Review of Systems  Constitutional: Negative for fever and fatigue.  HENT: Negative for ear pain.  Eyes: Negative for pain.  Respiratory: No cough, no PND Cardiovascular: Negative for chest pain,  palpitations and leg swelling.  Gastrointestinal: Negative for abdominal pain.  Genitourinary: Negative for dysuria.  Objective:   Physical Exam  Constitutional: Vital signs are normal. She appears well-developed and well-nourished. She is cooperative. Non-toxic appearance. She does not appear ill. No distress.  HENT:  Head: Normocephalic.  Right Ear: Hearing, tympanic membrane, external ear and ear canal normal. Tympanic membrane is not erythematous, not retracted and not bulging.  Left Ear: Hearing, tympanic membrane, external ear and ear canal normal. Tympanic membrane is not erythematous, not retracted and not bulging.  Nose: Mucosal edema and rhinorrhea present. Right sinus exhibits no maxillary sinus tenderness and no frontal sinus tenderness. Left sinus exhibits no maxillary sinus tenderness and no frontal sinus tenderness.  Mouth/Throat: Uvula is midline, oropharynx is clear and moist and mucous membranes are normal.  Eyes: Conjunctivae normal, EOM and lids are normal. Pupils are equal, round, and reactive to light. No foreign bodies found.  Neck: Trachea normal and normal range of motion. Neck supple. Carotid bruit is not present. No mass and no thyromegaly present.  Cardiovascular: Normal rate, regular rhythm, S1 normal, S2 normal, normal heart sounds, intact distal pulses and normal pulses. Exam reveals no gallop and no friction rub.  No murmur heard.  Pulmonary/Chest: Effort normal and breath sounds normal. Not tachypneic. No respiratory distress. She has no decreased breath sounds. She has no wheezes. She has no rhonchi. She has no rales.  Abdominal: Soft. Normal appearance and bowel sounds are normal. She exhibits no distension, no fluid wave, no abdominal bruit  and no mass. There is no hepatosplenomegaly. There is no tenderness. There is no rebound, no guarding and no CVA tenderness. No hernia.  Genitourinary: No breast swelling, tenderness, discharge or bleeding. Pelvic exam was  performed with patient supine.  Lymphadenopathy:  She has no cervical adenopathy.  She has no axillary adenopathy.  Neurological: She is alert. She has normal strength. No cranial nerve deficit or sensory deficit.  Skin: Skin is warm, dry and intact. No rash noted.  Psychiatric: Her speech is normal and behavior is normal. Judgment normal. Her mood appears not anxious. Cognition and memory are normal. She does not exhibit a depressed mood.  Assessment & Plan:   The patient's preventative maintenance and recommended screening tests for an annual wellness exam were reviewed in full today.  Brought up to date unless services declined.  Counselled on the importance of diet, exercise, and its role in overall health and mortality.  The patient's FH and SH was reviewed, including their home life, tobacco status, and drug and alcohol status.   Former smoker  Vaccines:Uptodate  Got flu in 01/2013.  Mammogram: to be scheduled  Colon: 2009 Dr. Nils Flack, polyp, due for repeat in 2019 DEXA: 2012, not interested in repeating. DVE/PAP: not indicated, TAH for non-cancer reasons.

## 2013-04-03 NOTE — Assessment & Plan Note (Signed)
Pt refuses dexa.

## 2013-04-03 NOTE — Assessment & Plan Note (Signed)
Stable control on current med. 

## 2013-04-17 DIAGNOSIS — C801 Malignant (primary) neoplasm, unspecified: Secondary | ICD-10-CM

## 2013-04-17 HISTORY — DX: Malignant (primary) neoplasm, unspecified: C80.1

## 2013-04-22 ENCOUNTER — Ambulatory Visit: Payer: Self-pay | Admitting: Family Medicine

## 2013-04-22 ENCOUNTER — Encounter: Payer: Self-pay | Admitting: Family Medicine

## 2013-05-03 ENCOUNTER — Other Ambulatory Visit: Payer: Self-pay | Admitting: Family Medicine

## 2013-05-12 ENCOUNTER — Other Ambulatory Visit: Payer: Self-pay | Admitting: *Deleted

## 2013-05-12 MED ORDER — SIMVASTATIN 80 MG PO TABS: 80.0000 mg | ORAL_TABLET | Freq: Every day | ORAL | Status: DC

## 2013-05-12 MED ORDER — BENAZEPRIL HCL 10 MG PO TABS: 10.0000 mg | ORAL_TABLET | Freq: Every day | ORAL | Status: DC

## 2013-05-12 MED ORDER — ATENOLOL 25 MG PO TABS: 25.0000 mg | ORAL_TABLET | Freq: Every day | ORAL | Status: DC

## 2013-05-12 MED ORDER — LEVOTHYROXINE SODIUM 50 MCG PO TABS: ORAL_TABLET | ORAL | Status: DC

## 2013-05-15 ENCOUNTER — Encounter: Payer: Self-pay | Admitting: Family Medicine

## 2013-05-15 MED ORDER — ATORVASTATIN CALCIUM 80 MG PO TABS: 80.0000 mg | ORAL_TABLET | Freq: Every day | ORAL | Status: DC

## 2013-05-15 NOTE — Addendum Note (Signed)
Addended by: Boerema Kitten on: 05/15/2013 03:27 PM   Modules accepted: Orders

## 2013-05-16 ENCOUNTER — Ambulatory Visit (INDEPENDENT_AMBULATORY_CARE_PROVIDER_SITE_OTHER): Payer: Medicare PPO | Admitting: Cardiovascular Disease

## 2013-05-16 ENCOUNTER — Encounter: Payer: Self-pay | Admitting: Cardiovascular Disease

## 2013-05-16 VITALS — BP 126/70 | HR 64 | Ht 64.0 in | Wt 134.0 lb

## 2013-05-16 DIAGNOSIS — R079 Chest pain, unspecified: Secondary | ICD-10-CM | POA: Diagnosis not present

## 2013-05-16 DIAGNOSIS — E785 Hyperlipidemia, unspecified: Secondary | ICD-10-CM | POA: Diagnosis not present

## 2013-05-16 DIAGNOSIS — I2510 Atherosclerotic heart disease of native coronary artery without angina pectoris: Secondary | ICD-10-CM | POA: Diagnosis not present

## 2013-05-16 DIAGNOSIS — I714 Abdominal aortic aneurysm, without rupture, unspecified: Secondary | ICD-10-CM | POA: Diagnosis not present

## 2013-05-16 MED ORDER — BENAZEPRIL HCL 10 MG PO TABS: 10.0000 mg | ORAL_TABLET | Freq: Every day | ORAL | Status: DC

## 2013-05-16 MED ORDER — ATENOLOL 25 MG PO TABS: 25.0000 mg | ORAL_TABLET | Freq: Every day | ORAL | Status: DC

## 2013-05-16 NOTE — Progress Notes (Signed)
Patient ID: Amy Stone, female    DOB: 07/01/1940, 73 y.o.   MRN: [id number]  HPI Comments: Ms. Benner is a very pleasant 73 year old woman with a history of coronary artery disease, bypass x4 in 2007, old inferior MI, abdominal aortic aneurysm measuring 5 cm as well as severe iliac arterial disease, previously evaluated for shortness of breath and chest pain. She underwent abdominal aortic aneurysm repair in March 2014. She states that she had a long recovery.   In followup today, she has been doing relatively well. She denies any of her typical anginal pain which is burning in her upper chest. She is active with no complaints. On her last clinic visit she had some grabbing chest pain. She reports that this has resolved. Occasionally has some point tenderness in her mediastinum  Total cholesterol 123, LDL 55, HDL 48  EKG shows normal sinus rhythm with rate 64 beats per minute with nonspecific ST abnormality   Outpatient Encounter Prescriptions as of 05/16/2013  Medication Sig  . aspirin EC 81 MG tablet Take 81 mg by mouth daily.  [name] atenolol (TENORMIN) 25 MG tablet Take 1 tablet (25 mg total) by mouth daily.  [name] atorvastatin (LIPITOR) 80 MG tablet Take 1 tablet (80 mg total) by mouth daily.  . benazepril (LOTENSIN) 10 MG tablet Take 1 tablet (10 mg total) by mouth daily.  [name] levothyroxine (SYNTHROID, LEVOTHROID) 50 MCG tablet TAKE ONE-HALF TABLET BY MOUTH ONCE DAILY  . tiotropium (SPIRIVA) 18 MCG inhalation capsule Place 1 capsule (18 mcg total) into inhaler and inhale daily.     Review of Systems  Constitutional: Negative.   HENT: Negative.   Eyes: Negative.   Respiratory: Negative.   Cardiovascular: Negative.   Gastrointestinal: Negative.   Endocrine: Negative.   Musculoskeletal: Positive for back pain.  Skin: Negative.   Allergic/Immunologic: Negative.   Neurological: Negative.   Hematological: Negative.   Psychiatric/Behavioral: Negative.   All other systems reviewed  and are negative.    BP 126/70  Pulse 64  Ht 5\' 4"  (1.626 m)  Wt 134 lb (60.782 kg)  BMI 22.99 kg/m2  Physical Exam  Nursing note and vitals reviewed. Constitutional: She is oriented to person, place, and time. She appears well-developed and well-nourished.  HENT:  Head: Normocephalic.  Nose: Nose normal.  Mouth/Throat: Oropharynx is clear and moist.  Eyes: Conjunctivae are normal. Pupils are equal, round, and reactive to light.  Neck: Normal range of motion. Neck supple. No JVD present. Carotid bruit is present.  Cardiovascular: Normal rate, regular rhythm, S1 normal, S2 normal, normal heart sounds and intact distal pulses.  Exam reveals no gallop and no friction rub.   No murmur heard. Pulses:      Carotid pulses are 1+ on the right side, and 1+ on the left side.      Radial pulses are 1+ on the right side, and 1+ on the left side.       Femoral pulses are 1+ on the right side, and 1+ on the left side.      Dorsalis pedis pulses are 0 on the right side, and 0 on the left side.       Posterior tibial pulses are 0 on the right side, and 0 on the left side.  Pulmonary/Chest: Effort normal and breath sounds normal. No respiratory distress. She has no wheezes. She has no rales. She exhibits no tenderness.  Abdominal: Soft. Bowel sounds are normal. She exhibits no distension. There is no tenderness.  Musculoskeletal: Normal range of motion. She exhibits no edema and no tenderness.  Lymphadenopathy:    She has no cervical adenopathy.  Neurological: She is alert and oriented to person, place, and time. Coordination normal.  Skin: Skin is warm and dry. No rash noted. No erythema.  Psychiatric: She has a normal mood and affect. Her behavior is normal. Judgment and thought content normal.    Assessment and Plan

## 2013-05-16 NOTE — Assessment & Plan Note (Signed)
Cholesterol is at goal on the current lipid regimen. No changes to the medications were made.  

## 2013-05-16 NOTE — Assessment & Plan Note (Signed)
Symptoms have resolved. No further testing at this time

## 2013-05-16 NOTE — Patient Instructions (Signed)
You are doing well. No medication changes were made.  Please call us if you have new issues that need to be addressed before your next appt.  Your physician wants you to follow-up in: 6 months.  You will receive a reminder letter in the mail two months in advance. If you don't receive a letter, please call our office to schedule the follow-up appointment.   

## 2013-05-16 NOTE — Assessment & Plan Note (Signed)
Currently with no symptoms of angina. No further workup at this time. Continue current medication regimen. 

## 2013-05-16 NOTE — Assessment & Plan Note (Signed)
Status post repair. Continue to follow with periodic abdominal ultrasound

## 2013-06-06 ENCOUNTER — Ambulatory Visit (INDEPENDENT_AMBULATORY_CARE_PROVIDER_SITE_OTHER): Payer: Commercial Managed Care - HMO | Admitting: Family Medicine

## 2013-06-06 ENCOUNTER — Encounter: Payer: Self-pay | Admitting: Family Medicine

## 2013-06-06 VITALS — BP 170/77 | HR 68 | Temp 98.1°F | Ht 65.0 in | Wt 133.0 lb

## 2013-06-06 DIAGNOSIS — D225 Melanocytic nevi of trunk: Secondary | ICD-10-CM | POA: Insufficient documentation

## 2013-06-06 DIAGNOSIS — D235 Other benign neoplasm of skin of trunk: Secondary | ICD-10-CM

## 2013-06-06 NOTE — Progress Notes (Signed)
Pre visit review using our clinic review tool, if applicable. No additional management support is needed unless otherwise documented below in the visit note. 

## 2013-06-06 NOTE — Progress Notes (Signed)
   Subjective:    Patient ID: Amy Stone, female    DOB: 11/19/1940, 73 y.o.   MRN: [id number]  HPI  73 year old female  Presents with new lesion on her back.  She states she noted painful area when rolling over in bed 48 hours ago. Daughter had noted a mole there small a year ago, but  states it is much better now.  Area is very itchy, burning, painful.  No fever. No ill feeling.   Review of Systems  Constitutional: Negative for fever and fatigue.  HENT: Negative for ear pain.   Eyes: Negative for pain.  Respiratory: Negative for cough.   Cardiovascular: Negative for chest pain.       Objective:   Physical Exam  Constitutional: Vital signs are normal. She appears well-developed and well-nourished. She is cooperative.  Non-toxic appearance. She does not appear ill. No distress.  HENT:  Head: Normocephalic.  Right Ear: Hearing, tympanic membrane, external ear and ear canal normal. Tympanic membrane is not erythematous, not retracted and not bulging.  Left Ear: Hearing, tympanic membrane, external ear and ear canal normal. Tympanic membrane is not erythematous, not retracted and not bulging.  Nose: No mucosal edema or rhinorrhea. Right sinus exhibits no maxillary sinus tenderness and no frontal sinus tenderness. Left sinus exhibits no maxillary sinus tenderness and no frontal sinus tenderness.  Mouth/Throat: Uvula is midline, oropharynx is clear and moist and mucous membranes are normal.  Eyes: Conjunctivae, EOM and lids are normal. Pupils are equal, round, and reactive to light. Lids are everted and swept, no foreign bodies found.  Neck: Trachea normal and normal range of motion. Neck supple. Carotid bruit is not present. No mass and no thyromegaly present.  Cardiovascular: Normal rate, regular rhythm, S1 normal, S2 normal, normal heart sounds, intact distal pulses and normal pulses.  Exam reveals no gallop and no friction rub.   No murmur heard. Pulmonary/Chest: Effort normal  and breath sounds normal. Not tachypneic. No respiratory distress. She has no decreased breath sounds. She has no wheezes. She has no rhonchi. She has no rales.  Abdominal: Soft. Normal appearance and bowel sounds are normal. There is no tenderness.  Neurological: She is alert.  Skin: Skin is warm, dry and intact. No rash noted.  Irritated skin colored nevus central back, very small laceration at edge of lesion, no surrounding erythema, several moderately large skin tags in axillae and  Left shoulder  Psychiatric: Her speech is normal and behavior is normal. Judgment and thought content normal. Her mood appears not anxious. Cognition and memory are normal. She does not exhibit a depressed mood.          Assessment & Plan:

## 2013-06-06 NOTE — Patient Instructions (Signed)
Stop at front desk for non-urgent referral to derm.  In meantime.[name] Apply antibiotics ointment ( neosporin)  Daily and bandaid.

## 2013-06-06 NOTE — Assessment & Plan Note (Signed)
Appears mole got caught on sheets and cause partial removal at side. No sign of infection. Pt requests derm referral for this and other areas to be removed.

## 2013-10-01 ENCOUNTER — Other Ambulatory Visit: Payer: Self-pay | Admitting: Family Medicine

## 2013-10-27 ENCOUNTER — Other Ambulatory Visit: Payer: Self-pay | Admitting: Family Medicine

## 2013-12-09 ENCOUNTER — Ambulatory Visit (INDEPENDENT_AMBULATORY_CARE_PROVIDER_SITE_OTHER): Payer: Medicare PPO | Admitting: Family Medicine

## 2013-12-09 ENCOUNTER — Ambulatory Visit (INDEPENDENT_AMBULATORY_CARE_PROVIDER_SITE_OTHER)
Admission: RE | Admit: 2013-12-09 | Discharge: 2013-12-09 | Disposition: A | Discharge status: Home / Self Care | Payer: Medicare PPO | Source: Ambulatory Visit | Attending: Family Medicine | Admitting: Family Medicine

## 2013-12-09 ENCOUNTER — Encounter: Payer: Self-pay | Admitting: Family Medicine

## 2013-12-09 VITALS — BP 120/60 | HR 57 | Temp 98.0°F | Ht 65.0 in | Wt 129.8 lb

## 2013-12-09 DIAGNOSIS — M545 Low back pain, unspecified: Secondary | ICD-10-CM

## 2013-12-09 DIAGNOSIS — M79605 Pain in left leg: Principal | ICD-10-CM

## 2013-12-09 MED ORDER — MELOXICAM 15 MG PO TABS: 15.0000 mg | ORAL_TABLET | Freq: Every day | ORAL | Status: DC

## 2013-12-09 MED ORDER — CYCLOBENZAPRINE HCL 10 MG PO TABS: 10.0000 mg | ORAL_TABLET | Freq: Every evening | ORAL | Status: DC | PRN

## 2013-12-09 NOTE — Progress Notes (Signed)
   Subjective:    Patient ID: Amy Stone, female    DOB: 05-16-1940, 73 y.o.   MRN: [id number]  Back Pain Pertinent negatives include no abdominal pain, chest pain, dysuria or fever.    73 year old female with hx of AA, CAD, PVD, COPD presents with new onset low back pain, bilateral, L>R. Pain radiates down to  Knees L > R.  Pain 9/10 on pain scale. Pain is worse with bending over and walking.  No numbness, mild weakness/ache in thighs now.  She reports it has been going on x 1 week, gradually better.  Cannot sleep at night at times. No associated change in activity except she has been picking peas few weeks ago, no fall, no injury.  She is walking with cane. Using heat and ice. She has been using tylenol prn, helped minimally.  No past history of neck issues. No past surgery.      Review of Systems  Constitutional: Negative for fever and fatigue.  HENT: Negative for ear pain.   Eyes: Negative for pain.  Respiratory: Negative for chest tightness and shortness of breath.   Cardiovascular: Negative for chest pain, palpitations and leg swelling.  Gastrointestinal: Negative for abdominal pain.  Genitourinary: Negative for dysuria.  Musculoskeletal: Positive for back pain.       Objective:   Physical Exam  Constitutional: Vital signs are normal. She appears well-developed and well-nourished. She is cooperative.  Non-toxic appearance. She does not appear ill. No distress.  HENT:  Head: Normocephalic.  Right Ear: Hearing, tympanic membrane, external ear and ear canal normal. Tympanic membrane is not erythematous, not retracted and not bulging.  Left Ear: Hearing, tympanic membrane, external ear and ear canal normal. Tympanic membrane is not erythematous, not retracted and not bulging.  Nose: No mucosal edema or rhinorrhea. Right sinus exhibits no maxillary sinus tenderness and no frontal sinus tenderness. Left sinus exhibits no maxillary sinus tenderness and no frontal sinus  tenderness.  Mouth/Throat: Uvula is midline, oropharynx is clear and moist and mucous membranes are normal.  Eyes: Conjunctivae, EOM and lids are normal. Pupils are equal, round, and reactive to light. Lids are everted and swept, no foreign bodies found.  Neck: Trachea normal and normal range of motion. Neck supple. Carotid bruit is not present. No mass and no thyromegaly present.  Cardiovascular: Normal rate, regular rhythm, S1 normal, S2 normal, normal heart sounds, intact distal pulses and normal pulses.  Exam reveals no gallop and no friction rub.   No murmur heard. Pulmonary/Chest: Effort normal and breath sounds normal. Not tachypneic. No respiratory distress. She has no decreased breath sounds. She has no wheezes. She has no rhonchi. She has no rales.  Abdominal: Soft. Normal appearance and bowel sounds are normal. There is no tenderness.  Musculoskeletal:       Lumbar back: She exhibits decreased range of motion, tenderness and bony tenderness. She exhibits no swelling, no edema and no deformity.  Pos SLR on left, neg faber's  Neurological: She is alert. She has normal strength. She displays no atrophy. No sensory deficit. She exhibits normal muscle tone.  Skin: Skin is warm, dry and intact. No rash noted.  Psychiatric: Her speech is normal and behavior is normal. Judgment and thought content normal. Her mood appears not anxious. Cognition and memory are normal. She does not exhibit a depressed mood.          Assessment & Plan:

## 2013-12-09 NOTE — Patient Instructions (Signed)
We will call with X-ray. Start meloxicam daily. Can use flexeril at night as needed. Heat on low back, and gentle stretching.

## 2013-12-09 NOTE — Assessment & Plan Note (Signed)
Will eval with X-ray given pt age and vertebral ttp. Start NSAID, muscle relaxant, ehat and gentle stretching.  Info on back exercises given and reviewed. Pt to start as feeling better.

## 2013-12-09 NOTE — Progress Notes (Signed)
Pre visit review using our clinic review tool, if applicable. No additional management support is needed unless otherwise documented below in the visit note. 

## 2014-01-05 ENCOUNTER — Other Ambulatory Visit: Payer: Self-pay | Admitting: *Deleted

## 2014-01-05 MED ORDER — ATENOLOL 25 MG PO TABS: 25.0000 mg | ORAL_TABLET | Freq: Every day | ORAL | Status: DC

## 2014-01-05 MED ORDER — BENAZEPRIL HCL 10 MG PO TABS: 10.0000 mg | ORAL_TABLET | Freq: Every day | ORAL | Status: DC

## 2014-01-08 ENCOUNTER — Other Ambulatory Visit: Payer: Self-pay

## 2014-01-08 ENCOUNTER — Other Ambulatory Visit: Payer: Self-pay | Admitting: Family Medicine

## 2014-01-08 MED ORDER — ATORVASTATIN CALCIUM 80 MG PO TABS: 80.0000 mg | ORAL_TABLET | Freq: Every day | ORAL | Status: DC

## 2014-01-08 NOTE — Telephone Encounter (Signed)
Pt left note requesting refill atorvastatin to [facility] [address]. Spoke with pt refilled #[phone] and pt needs to schedule appt. Pt will cb to schedule appt. Refill done.

## 2014-01-22 ENCOUNTER — Encounter: Payer: Self-pay | Admitting: Interventional Cardiology

## 2014-01-22 ENCOUNTER — Encounter: Payer: Self-pay | Admitting: Family Medicine

## 2014-01-22 ENCOUNTER — Ambulatory Visit (INDEPENDENT_AMBULATORY_CARE_PROVIDER_SITE_OTHER): Payer: Commercial Managed Care - HMO | Admitting: Family Medicine

## 2014-01-22 VITALS — BP 120/50 | HR 57 | Temp 98.2°F | Ht 65.0 in | Wt 130.0 lb

## 2014-01-22 DIAGNOSIS — O10019 Pre-existing essential hypertension complicating pregnancy, unspecified trimester: Secondary | ICD-10-CM

## 2014-01-22 DIAGNOSIS — J449 Chronic obstructive pulmonary disease, unspecified: Secondary | ICD-10-CM

## 2014-01-22 DIAGNOSIS — E785 Hyperlipidemia, unspecified: Secondary | ICD-10-CM

## 2014-01-22 DIAGNOSIS — Z23 Encounter for immunization: Secondary | ICD-10-CM

## 2014-01-22 DIAGNOSIS — E039 Hypothyroidism, unspecified: Secondary | ICD-10-CM

## 2014-01-22 MED ORDER — ATORVASTATIN CALCIUM 80 MG PO TABS: 80.0000 mg | ORAL_TABLET | Freq: Every day | ORAL | Status: DC

## 2014-01-22 MED ORDER — BENAZEPRIL HCL 10 MG PO TABS: ORAL_TABLET | ORAL | Status: DC

## 2014-01-22 MED ORDER — ATENOLOL 25 MG PO TABS: ORAL_TABLET | ORAL | Status: DC

## 2014-01-22 MED ORDER — TIOTROPIUM BROMIDE MONOHYDRATE 18 MCG IN CAPS: ORAL_CAPSULE | RESPIRATORY_TRACT | Status: DC

## 2014-01-22 MED ORDER — LEVOTHYROXINE SODIUM 50 MCG PO TABS: ORAL_TABLET | ORAL | Status: DC

## 2014-01-22 NOTE — Assessment & Plan Note (Signed)
Stable on spiriva

## 2014-01-22 NOTE — Patient Instructions (Signed)
Continue walking as able and working on eating healthy foods.

## 2014-01-22 NOTE — Assessment & Plan Note (Signed)
Stable at last check. Repeat in 6 months.

## 2014-01-22 NOTE — Progress Notes (Signed)
73 year old female presents for follow up.   Her back pain has resolved completely. She is back to her normal activity.   She feels well overall.  Hypertension: Well controlled on current meds: atenolol, lotensin.  BP Readings from Last 3 Encounters:  01/22/14 120/50  12/09/13 120/60  06/06/13 170/77  Chest pain with exertion:None  Edema:None  Short of breath:mild  Average home BPs: well controlled  Other issues:   Also reviewed OV by Dr. Rockey [facility].   Elevated Cholesterol: LDL at goal <70 on simvastatin 80 mg daily  At last check. Using medications without problems:None  Muscle aches: None  Diet compliance:Eating a lot of fresh fruit and veggies.  Exercise: walks a lot, cutting wood  Other complaints:   Hypothyroid: Stable control on current dose at last check Lab Results  Component Value Date   TSH 0.28* 03/31/2013  Free t3 and free t4 at GOAL   COPD, on spiriva: Daily mild SOB with exertion despite this. No daily cough at baseline.   Review of Systems  Constitutional: Negative for fever and fatigue.  HENT: Negative for ear pain.  Eyes: Negative for pain.  Respiratory: No cough, no PND  Cardiovascular: Negative for chest pain, palpitations and leg swelling.  Gastrointestinal: Negative for abdominal pain.  Genitourinary: Negative for dysuria.  Objective:   Physical Exam  Constitutional: Vital signs are normal. She appears well-developed and well-nourished. She is cooperative. Non-toxic appearance. She does not appear ill. No distress.  HENT:  Head: Normocephalic.  Right Ear: Hearing, tympanic membrane, external ear and ear canal normal. Tympanic membrane is not erythematous, not retracted and not bulging.  Left Ear: Hearing, tympanic membrane, external ear and ear canal normal. Tympanic membrane is not erythematous, not retracted and not bulging.  Nose:  Right sinus exhibits no maxillary sinus tenderness and no frontal sinus tenderness. Left sinus exhibits no  maxillary sinus tenderness and no frontal sinus tenderness.  Mouth/Throat: Uvula is midline, oropharynx is clear and moist and mucous membranes are normal.  Eyes: Conjunctivae normal, EOM and lids are normal. Pupils are equal, round, and reactive to light. No foreign bodies found.  Neck: Trachea normal and normal range of motion. Neck supple. Carotid bruit is not present. No mass and no thyromegaly present.  Cardiovascular: Normal rate, regular rhythm, S1 normal, S2 normal, normal heart sounds, intact distal pulses and normal pulses. Exam reveals no gallop and no friction rub.  No murmur heard.  Pulmonary/Chest: Effort normal and breath sounds normal. Not tachypneic. No respiratory distress. She has no decreased breath sounds. She has no wheezes. She has no rhonchi. She has no rales.  Abdominal: Soft. Normal appearance and bowel sounds are normal. She exhibits no distension, no fluid wave, no abdominal bruit and no mass. There is no hepatosplenomegaly. There is no tenderness. There is no rebound, no guarding and no CVA tenderness. No hernia.  Neurological: She is alert. She has normal strength. No cranial nerve deficit or sensory deficit.  Skin: Skin is warm, dry and intact. No rash noted.  Psychiatric: Her speech is normal and behavior is normal. Judgment normal. Her mood appears not anxious. Cognition and memory are normal. She does not exhibit a depressed mood.

## 2014-01-22 NOTE — Assessment & Plan Note (Signed)
Well controlled. Continue current medication.  

## 2014-01-22 NOTE — Progress Notes (Signed)
Pre visit review using our clinic review tool, if applicable. No additional management support is needed unless otherwise documented below in the visit note. 

## 2014-01-22 NOTE — Assessment & Plan Note (Signed)
Well controlled at last check CAD. Encouraged exercise, weight loss, healthy eating habits.

## 2014-01-23 ENCOUNTER — Telehealth: Payer: Self-pay | Admitting: Family Medicine

## 2014-01-23 NOTE — Telephone Encounter (Signed)
emmi mailed  °

## 2014-02-24 ENCOUNTER — Ambulatory Visit (INDEPENDENT_AMBULATORY_CARE_PROVIDER_SITE_OTHER): Payer: Commercial Managed Care - HMO | Admitting: Cardiovascular Disease

## 2014-02-24 ENCOUNTER — Encounter: Payer: Self-pay | Admitting: Cardiovascular Disease

## 2014-02-24 VITALS — BP 138/70 | HR 60 | Ht 64.0 in | Wt 133.0 lb

## 2014-02-24 DIAGNOSIS — I25709 Atherosclerosis of coronary artery bypass graft(s), unspecified, with unspecified angina pectoris: Secondary | ICD-10-CM

## 2014-02-24 DIAGNOSIS — R05 Cough: Secondary | ICD-10-CM | POA: Insufficient documentation

## 2014-02-24 DIAGNOSIS — J449 Chronic obstructive pulmonary disease, unspecified: Secondary | ICD-10-CM

## 2014-02-24 DIAGNOSIS — I714 Abdominal aortic aneurysm, without rupture, unspecified: Secondary | ICD-10-CM

## 2014-02-24 DIAGNOSIS — R053 Chronic cough: Secondary | ICD-10-CM

## 2014-02-24 DIAGNOSIS — I70219 Atherosclerosis of native arteries of extremities with intermittent claudication, unspecified extremity: Secondary | ICD-10-CM

## 2014-02-24 DIAGNOSIS — E785 Hyperlipidemia, unspecified: Secondary | ICD-10-CM

## 2014-02-24 DIAGNOSIS — I739 Peripheral vascular disease, unspecified: Secondary | ICD-10-CM

## 2014-02-24 MED ORDER — LOSARTAN POTASSIUM 50 MG PO TABS: 50.0000 mg | ORAL_TABLET | Freq: Every day | ORAL | Status: DC

## 2014-02-24 NOTE — Assessment & Plan Note (Signed)
Chronic mild shortness of breath, also low-grade chronic cough.

## 2014-02-24 NOTE — Assessment & Plan Note (Signed)
She denies any symptoms concerning for claudication at this time. We'll continue aggressive medical management, ABIs on an annual basis

## 2014-02-24 NOTE — Progress Notes (Signed)
Patient ID: Amy Stone, female    DOB: 10/11/1940, 73 y.o.   MRN: [id number]  HPI Comments: [name] is a very pleasant 73 year old woman with a history of coronary artery disease, bypass x4 in 2007, old inferior MI, abdominal aortic aneurysm measuring 5 cm as well as severe iliac arterial disease, s/p abdominal aortic aneurysm repair in March 2014. She states that she had a long recovery. previously evaluated for shortness of breath and chest pain.   In follow-up today, she reports that she is doing relatively well. She has had a cough for at least one year. Seem to start after her heart attack. On further discussion, cough has been worse recently, possibly postnasal drip She also has a catch, sharp pain in her right upper quadrant, sometimes extending to her midline upper epigastric area  She denies any of her typical anginal pain which is burning in her upper chest.  EKG on today's visit shows normal sinus rhythm with rate 60 bpm with no significant ST or T-wave changes  Other past medical history Total cholesterol 123, LDL 55, HDL 48  EKG shows normal sinus rhythm with rate 64 beats per minute with nonspecific ST abnormality   Outpatient Encounter Prescriptions as of 02/24/2014  Medication Sig  . aspirin EC 81 MG tablet Take 81 mg by mouth daily.  [name] atenolol (TENORMIN) 25 MG tablet TAKE ONE TABLET BY MOUTH ONCE DAILY  . atorvastatin (LIPITOR) 80 MG tablet Take 1 tablet (80 mg total) by mouth daily.  . benazepril (LOTENSIN) 10 MG tablet TAKE ONE TABLET BY MOUTH ONCE DAILY  . levothyroxine (SYNTHROID, LEVOTHROID) 50 MCG tablet TAKE ONE-HALF TABLET BY MOUTH ONCE DAILY  . tiotropium (SPIRIVA HANDIHALER) 18 MCG inhalation capsule INHALE ONE DOSE BY MOUTH ONCE DAILY    Social history  reports that she quit smoking about 8 years ago. Her smoking use included Cigarettes. She smoked 0.00 packs per day for 1 year. She has never used smokeless tobacco. She reports that she does not  drink alcohol or use illicit drugs.   Review of Systems  Constitutional: Negative.   Respiratory: Negative.   Cardiovascular: Negative.   Gastrointestinal:       Right upper  quadrant pain  Musculoskeletal: Negative.   Neurological: Negative.   Psychiatric/Behavioral: Negative.   All other systems reviewed and are negative.   BP 138/70 mmHg  Pulse 60  Ht 5\' 4"  (1.626 m)  Wt 133 lb (60.328 kg)  BMI 22.82 kg/m2  Physical Exam  Constitutional: She is oriented to person, place, and time. She appears well-developed and well-nourished.  HENT:  Head: Normocephalic.  Nose: Nose normal.  Mouth/Throat: Oropharynx is clear and moist.  Eyes: Conjunctivae are normal. Pupils are equal, round, and reactive to light.  Neck: Normal range of motion. Neck supple. No JVD present.  Cardiovascular: Normal rate, regular rhythm, S1 normal, S2 normal, normal heart sounds and intact distal pulses.  Exam reveals no gallop and no friction rub.   No murmur heard. Pulmonary/Chest: Effort normal and breath sounds normal. No respiratory distress. She has no wheezes. She has no rales. She exhibits no tenderness.  Abdominal: Soft. Bowel sounds are normal. She exhibits no distension. There is no tenderness.  Musculoskeletal: Normal range of motion. She exhibits no edema or tenderness.  Lymphadenopathy:    She has no cervical adenopathy.  Neurological: She is alert and oriented to person, place, and time. Coordination normal.  Skin: Skin is warm and dry. No rash noted. No  erythema.  Psychiatric: She has a normal mood and affect. Her behavior is normal. Judgment and thought content normal.    Assessment and Plan  Nursing note and vitals reviewed.

## 2014-02-24 NOTE — Patient Instructions (Addendum)
You are doing well.  Please hold the benazepril (this can cause cough) Start losartan one a day  For nose drip, Try zyrtec (cetirazine, generic)  Over the counter Or flonase nasal spray  If you continue to have cough, call the office  Please call us if you have new issues that need to be addressed before your next appt.  Your physician wants you to follow-up in: 12 months.  You will receive a reminder letter in the mail two months in advance. If you don't receive a letter, please call our office to schedule the follow-up appointment.  Your next appointment will be scheduled in our new office located at :  [facility]  [address], [place]  [place], [place] [address]

## 2014-02-24 NOTE — Assessment & Plan Note (Signed)
Cholesterol is at goal on the current lipid regimen. No changes to the medications were made.  

## 2014-02-24 NOTE — Assessment & Plan Note (Signed)
We have recommended that she hold her benazepril, start losartan 50 mg daily. Would also try Flonase or Zyrtec for postnasal drip. Could consider proton pump inhibitor if symptoms do not improve

## 2014-02-24 NOTE — Assessment & Plan Note (Signed)
March 2014 with abdominal aorta aneurysm repair. Does not appear to have had ultrasound since that time. We will suggest she have routine screening with ultrasound

## 2014-02-25 ENCOUNTER — Other Ambulatory Visit: Payer: Self-pay

## 2014-02-25 DIAGNOSIS — I718 Aortic aneurysm of unspecified site, ruptured: Secondary | ICD-10-CM

## 2014-03-26 ENCOUNTER — Encounter (HOSPITAL_COMMUNITY): Payer: Self-pay | Admitting: Vascular Surgery

## 2014-04-07 ENCOUNTER — Encounter: Payer: Self-pay | Admitting: Family

## 2014-04-08 ENCOUNTER — Encounter: Payer: Self-pay | Admitting: Family

## 2014-04-08 ENCOUNTER — Ambulatory Visit (HOSPITAL_COMMUNITY)
Admission: RE | Admit: 2014-04-08 | Discharge: 2014-04-08 | Disposition: A | Discharge status: Home / Self Care | Payer: Commercial Managed Care - HMO | Source: Ambulatory Visit | Attending: Vascular Surgery | Admitting: Vascular Surgery

## 2014-04-08 ENCOUNTER — Other Ambulatory Visit: Payer: Self-pay | Admitting: Vascular Surgery

## 2014-04-08 ENCOUNTER — Ambulatory Visit (HOSPITAL_COMMUNITY)
Admission: RE | Admit: 2014-04-08 | Discharge: 2014-04-08 | Disposition: A | Discharge status: Home / Self Care | Payer: Commercial Managed Care - HMO | Source: Ambulatory Visit | Attending: Family | Admitting: Family

## 2014-04-08 ENCOUNTER — Ambulatory Visit (INDEPENDENT_AMBULATORY_CARE_PROVIDER_SITE_OTHER): Payer: Commercial Managed Care - HMO | Admitting: Family

## 2014-04-08 VITALS — BP 120/71 | HR 55 | Resp 14 | Ht 66.0 in | Wt 130.0 lb

## 2014-04-08 DIAGNOSIS — Z48812 Encounter for surgical aftercare following surgery on the circulatory system: Secondary | ICD-10-CM | POA: Insufficient documentation

## 2014-04-08 DIAGNOSIS — Z9889 Other specified postprocedural states: Secondary | ICD-10-CM

## 2014-04-08 DIAGNOSIS — I714 Abdominal aortic aneurysm, without rupture, unspecified: Secondary | ICD-10-CM

## 2014-04-08 DIAGNOSIS — I700 Atherosclerosis of aorta: Secondary | ICD-10-CM

## 2014-04-08 DIAGNOSIS — M79661 Pain in right lower leg: Secondary | ICD-10-CM | POA: Insufficient documentation

## 2014-04-08 DIAGNOSIS — I7409 Other arterial embolism and thrombosis of abdominal aorta: Secondary | ICD-10-CM

## 2014-04-08 NOTE — Progress Notes (Signed)
[facility] HISTORY AND PHYSICAL -PAD  History of Present Illness Amy Stone is a 73 y.o. female patient of Dr. Scot Dock status post AAA and aortic bifemoral bypass graft April 2013 for significant aortoiliac occlusive disease which was addressed at the time of her aneurysm repair with aortobifemoral bypass grafting. She returns today for PAD follow up, she denies any claudication symptoms with walking any distance. Her legs do hurt with walking on concrete floor only. She denies non-healing wounds.  She denies any stroke or TIA history. She does flexibility and strengthening exercises indoors in the cold months and is active outdoors in the warm months.   She did have a 4 vessel CABG in 2007 and an MI, she stopped smoking then.  She denies New Medical or Surgical History.  Pt Diabetic: No Pt smoker: former smoker, quit in 2007  Pt meds include: Statin :Yes Betablocker: Yes ASA: Yes Other anticoagulants/antiplatelets: no   Past Medical History  Diagnosis Date  . Epigastric pain   . Osteoporosis, unspecified   . Cervicalgia   . Unspecified tinnitus   . Neoplasm of uncertain behavior of skin   . Thoracic aneurysm, ruptured   . Other chest pain   . Breast screening, unspecified   . Unspecified essential hypertension   . Other and unspecified hyperlipidemia   . Unspecified hypothyroidism   . Osteoarthrosis, unspecified whether generalized or localized, unspecified site   . Chronic airway obstruction, not elsewhere classified   . Peripheral vascular disease   . Myocardial infarction 2007  . Coronary atherosclerosis of unspecified type of vessel, native or graft 2007  . PONV (postoperative nausea and vomiting)   . Shortness of breath   . Blood transfusion   . UTI (lower urinary tract infection)   . Cancer     Bladder  . Thyroid nodule     seen on CT Scan 07/19/2011  . AAA (abdominal aortic aneurysm)     Social History History   Substance Use Topics  . Smoking status: Former Smoker -- 1 years    Types: Cigarettes    Quit date: 11/24/2005  . Smokeless tobacco: Never Used     Comment: 40 pack years   . Alcohol Use: No    Family History Family History  Problem Relation Age of Onset  . Alzheimer's disease Father   . Parkinsonism Father   . Heart defect Mother     enlarged heart  . Heart attack Mother   . Hypertension Sister   . Diabetes Sister   . Heart disease Sister   . Hyperlipidemia Sister   . Hypothyroidism Sister   . Aneurysm Paternal Grandfather     Past Surgical History  Procedure Laterality Date  . Acute inferior mi  11/2005  . Port wine stain removal w/ laser  2004    bladder cancer  . Goiter removal  1979  . Coronary artery bypass graft  11/2005  . Aorta - bilateral femoral artery bypass graft  07/25/2011    Procedure: AORTA BIFEMORAL BYPASS GRAFT;  Surgeon: Angelia Mould, MD;  Location: [facility];  Service: Vascular;  Laterality: Bilateral;  . Abdominal hysterectomy    . Pr vein bypass graft,aorto-fem-pop  July 25, 2011  . Abdominal aortagram N/A 05/15/2011    Procedure: ABDOMINAL Maxcine Ham;  Surgeon: Angelia Mould, MD;  Location: [facility] CATH LAB;  Service: Cardiovascular;  Laterality: N/A;    Allergies  Allergen Reactions  . Iohexol      Code:  HIVES, Desc: CONTRAST REACTION OF HIVES (LARGE WHELPS DEVELOPED OVER ENTIRE BODY/MMS   . Prednisone Other (See Comments)    hallucinations    Current Outpatient Prescriptions  Medication Sig Dispense Refill  . aspirin EC 81 MG tablet Take 81 mg by mouth daily.    [name] atenolol (TENORMIN) 25 MG tablet TAKE ONE TABLET BY MOUTH ONCE DAILY 90 tablet 3  . atorvastatin (LIPITOR) 80 MG tablet Take 1 tablet (80 mg total) by mouth daily. 90 tablet 3  . benazepril (LOTENSIN) 10 MG tablet Take by mouth daily.    [name] levothyroxine (SYNTHROID, LEVOTHROID) 50 MCG tablet TAKE ONE-HALF TABLET BY MOUTH ONCE DAILY 45 tablet 3  . losartan (COZAAR) 50 MG  tablet Take 1 tablet (50 mg total) by mouth daily. (Patient not taking: Reported on 04/08/2014) 30 tablet 11  . tiotropium (SPIRIVA HANDIHALER) 18 MCG inhalation capsule INHALE ONE DOSE BY MOUTH ONCE DAILY 90 capsule 3   No current facility-administered medications for this visit.    ROS: See HPI for pertinent positives and negatives.   Physical Examination  Filed Vitals:   04/08/14 1141  BP: 120/71  Pulse: 55  Resp: 14  Height: 5\' 6"  (1.676 m)  Weight: 130 lb (58.968 kg)  SpO2: 96%   Body mass index is 20.99 kg/(m^2).  General: A&O x 3, WDWN Gait: normal Eyes: PERRLA, Pulmonary: CTAB, without wheezes , rales or rhonchi Cardiac: regular Rhythm, without murmur     Carotid Bruits Left Right   Negative Negative  Aorta: is faintly palpable Radial pulses: are 2+ and =   VASCULAR EXAM: Extremities without ischemic changes  without Gangrene; without open wounds.     LE Pulses LEFT RIGHT   FEMORAL 2+palpable 2+ palpable    POPLITEAL not palpable  not palpable   POSTERIOR TIBIAL not palpable  not palpable    DORSALIS PEDIS  ANTERIOR TIBIAL 2+ palpable  2+ palpable    Abdomen: soft, NT, no palpable masses. Skin: no rashes, no ulcers. Musculoskeletal: no muscle wasting or atrophy. Neurologic: A&O X 3; Appropriate Affect ; SENSATION: normal; MOTOR FUNCTION: moving all extremities equally, motor strength 5/5 throughout. Speech is fluent/normal.  CN 2-12 intact.    Non-Invasive Vascular Imaging: DATE: 04/08/2014 LOWER EXTREMITY ARTERIAL DUPLEX EVALUATION    INDICATION: Abdominal Aortic Aneurysm    PREVIOUS INTERVENTION(S): Aorto bi-femoral BPG 07/25/2011.    DUPLEX EXAM:     RIGHT  LEFT   Peak Systolic Velocity (cm/s) Ratio (if  abnormal) Waveform  Peak Systolic Velocity (cm/s) Ratio (if abnormal) Waveform     Inflow Artery        Proximal Anastomosis        Proximal Graft        Mid Graft         Distal Graft     61  T Distal Anastomosis 75  T  117  T Outflow Artery 118  T  1.33/0.61 Today's ABI / TBI 1.24/0.61  1.15 Previous ABI / TBI (09/06/2011 ) 1.10    Waveform:    M - Monophasic       B - Biphasic       T - Triphasic  If Ankle Brachial Index (ABI) or Toe Brachial Index (TBI) performed, please see complete report     ADDITIONAL FINDINGS:     IMPRESSION: Patent distal limbs of aorto-bi-femoral bypass graft, no hemodynamically significant plaque or thrombus visualized at limited imaged segments.    Compared to the previous exam:  No previous  duplex of aorto bi-femoral graft to compare.     ASSESSMENT: Amy Stone is a 73 y.o. female who presents status post aortic bifemoral bypass graft April 2013 for routine PAD follow up. She has no claudication symptoms, no tissue loss, no back or abdominal pain. Today's lower arterial Duplex reveals patent distal limbs of aorto-bi-femoral bypass graft, no hemodynamically significant plaque or thrombus visualized at limited imaged segments. ABI's are both normal with all triphasic waveforms. Bilateral toe pressures are 81, 0.61 TBI's.  PLAN:  Continue active lifestyle. I discussed in depth with the patient the nature of atherosclerosis, and emphasized the importance of maximal medical management including strict control of blood pressure, blood glucose, and lipid levels, obtaining regular exercise, and continued cessation of smoking.  The patient is aware that without maximal medical management the underlying atherosclerotic disease process will progress, limiting the benefit of any interventions.  Based on the patient's vascular studies and examination, pt will return to clinic in 1 year for ABI's.  The patient was given information about PAD including signs,  symptoms, treatment, what symptoms should prompt the patient to seek immediate medical care, and risk reduction measures to take.  Clemon Chambers, RN, MSN, FNP-C [facility] Phone: [phone]  Clinic MD: Early on call  04/08/2014 12:08 PM

## 2014-04-08 NOTE — Patient Instructions (Signed)

## 2014-04-13 ENCOUNTER — Encounter: Payer: Self-pay | Admitting: Internal Medicine

## 2014-04-13 ENCOUNTER — Ambulatory Visit (INDEPENDENT_AMBULATORY_CARE_PROVIDER_SITE_OTHER): Payer: Commercial Managed Care - HMO | Admitting: Internal Medicine

## 2014-04-13 VITALS — BP 132/64 | HR 76 | Temp 97.9°F | Wt 132.0 lb

## 2014-04-13 DIAGNOSIS — N6310 Unspecified lump in the right breast, unspecified quadrant: Secondary | ICD-10-CM

## 2014-04-13 DIAGNOSIS — N63 Unspecified lump in breast: Secondary | ICD-10-CM

## 2014-04-13 NOTE — Progress Notes (Signed)
Pre visit review using our clinic review tool, if applicable. No additional management support is needed unless otherwise documented below in the visit note. 

## 2014-04-13 NOTE — Progress Notes (Signed)
Subjective:    Patient ID: Amy Stone, female    DOB: 1941/02/04, 73 y.o.   MRN: [id number]  HPI  Pt presents to the clinic today with c/o a lump in her right breast. She noticed this 4 days ago. It is located near her nipple on the right side. The lump itself does not hurt but she does have some associated pain near hear armpit. She denies changes in the color of her skin. She denies discharge from the nipple. She has no personal history of breast cancer but does have a sister who has had vreast cancer. Her last mammogram was 04/2013- normal.  Review of Systems      Past Medical History  Diagnosis Date  . Epigastric pain   . Osteoporosis, unspecified   . Cervicalgia   . Unspecified tinnitus   . Neoplasm of uncertain behavior of skin   . Thoracic aneurysm, ruptured   . Other chest pain   . Breast screening, unspecified   . Unspecified essential hypertension   . Other and unspecified hyperlipidemia   . Unspecified hypothyroidism   . Osteoarthrosis, unspecified whether generalized or localized, unspecified site   . Chronic airway obstruction, not elsewhere classified   . Peripheral vascular disease   . Myocardial infarction 2007  . Coronary atherosclerosis of unspecified type of vessel, native or graft 2007  . PONV (postoperative nausea and vomiting)   . Shortness of breath   . Blood transfusion   . UTI (lower urinary tract infection)   . Cancer     Bladder  . Thyroid nodule     seen on CT Scan 07/19/2011  . AAA (abdominal aortic aneurysm)     Current Outpatient Prescriptions  Medication Sig Dispense Refill  . aspirin EC 81 MG tablet Take 81 mg by mouth daily.    [name] atenolol (TENORMIN) 25 MG tablet TAKE ONE TABLET BY MOUTH ONCE DAILY 90 tablet 3  . atorvastatin (LIPITOR) 80 MG tablet Take 1 tablet (80 mg total) by mouth daily. 90 tablet 3  . levothyroxine (SYNTHROID, LEVOTHROID) 50 MCG tablet TAKE ONE-HALF TABLET BY MOUTH ONCE DAILY 45 tablet 3  . losartan (COZAAR) 50  MG tablet Take 1 tablet (50 mg total) by mouth daily. 30 tablet 11  . tiotropium (SPIRIVA HANDIHALER) 18 MCG inhalation capsule INHALE ONE DOSE BY MOUTH ONCE DAILY 90 capsule 3   No current facility-administered medications for this visit.    Allergies  Allergen Reactions  . Iohexol      Code: HIVES, Desc: CONTRAST REACTION OF HIVES (LARGE WHELPS DEVELOPED OVER ENTIRE BODY/MMS   . Prednisone Other (See Comments)    hallucinations    Family History  Problem Relation Age of Onset  . Alzheimer's disease Father   . Parkinsonism Father   . Heart defect Mother     enlarged heart  . Heart attack Mother   . Hypertension Sister   . Diabetes Sister   . Heart disease Sister   . Hyperlipidemia Sister   . Hypothyroidism Sister   . Aneurysm Paternal Grandfather     History   Social History  . Marital Status: Married    Spouse Name: N/A    Number of Children: N/A  . Years of Education: N/A   Occupational History  . Not on file.   Social History Main Topics  . Smoking status: Former Smoker -- 1 years    Types: Cigarettes    Quit date: 11/24/2005  . Smokeless tobacco: Never Used  Comment: 40 pack years   . Alcohol Use: No  . Drug Use: No  . Sexual Activity: Yes    Birth Control/ Protection: Post-menopausal   Other Topics Concern  . Not on file   Social History Narrative   Regular exercise- walking    Diet: heart healthy diet.   End of Life: no living will, no HCPOA    Full Code. (reviewed 2014)     Constitutional: Denies fever, malaise, fatigue, headache or abrupt weight changes.  Respiratory: Denies difficulty breathing, shortness of breath, cough or sputum production.   Cardiovascular: Denies chest pain, chest tightness, palpitations or swelling in the hands or feet.  Skin: Pt reports lump of right breast. Denies redness, rashes, lesions or ulcercations.   No other specific complaints in a complete review of systems (except as listed in HPI  above).  Objective:   Physical Exam  BP 132/64 mmHg  Pulse 76  Temp(Src) 97.9 F (36.6 C) (Oral)  Wt 132 lb (59.875 kg)  SpO2 98% Wt Readings from Last 3 Encounters:  04/13/14 132 lb (59.875 kg)  04/08/14 130 lb (58.968 kg)  02/24/14 133 lb (60.328 kg)    General: Appears her stated age, well developed, well nourished in NAD. Skin: Breast symmetrical. Pea size hard mass noted at 7-8 oclock just adjacent to the right nipple. No discharge able to be expressed from the nipple.  Cardiovascular: Normal rate and rhythm. S1,S2 noted.  No murmur, rubs or gallops noted.  Pulmonary/Chest: Normal effort and positive vesicular breath sounds. No respiratory distress. No wheezes, rales or ronchi noted.    BMET    Component Value Date/Time   NA 138 03/31/2013 1050   K 3.9 03/31/2013 1050   CL 103 03/31/2013 1050   CO2 26 03/31/2013 1050   GLUCOSE 95 03/31/2013 1050   BUN 16 03/31/2013 1050   CREATININE 1.0 03/31/2013 1050   CALCIUM 9.5 03/31/2013 1050   GFRNONAA >90 08/05/2011 0650   GFRAA >90 08/05/2011 0650    Lipid Panel     Component Value Date/Time   CHOL 123 03/31/2013 1050   TRIG 100.0 03/31/2013 1050   HDL 48.20 03/31/2013 1050   CHOLHDL 3 03/31/2013 1050   VLDL 20.0 03/31/2013 1050   LDLCALC 55 03/31/2013 1050    CBC    Component Value Date/Time   WBC 10.1 03/31/2013 1050   RBC 4.34 03/31/2013 1050   HGB 12.6 03/31/2013 1050   HCT 38.1 03/31/2013 1050   PLT 229.0 03/31/2013 1050   MCV 87.7 03/31/2013 1050   MCH 28.7 08/05/2011 0650   MCHC 33.2 03/31/2013 1050   RDW 15.0* 03/31/2013 1050   LYMPHSABS 3.0 03/31/2013 1050   MONOABS 0.8 03/31/2013 1050   EOSABS 0.2 03/31/2013 1050   BASOSABS 0.0 03/31/2013 1050    Hgb A1C No results found for: HGBA1C      Assessment & Plan:   Mass of right breast:  Will order diagnostic mammogram and ultrasound of right breast See Rosaria Ferries on your way out to schedule  Will follow up with your after your tests are  done

## 2014-04-13 NOTE — Patient Instructions (Signed)
Mammography Mammography is an X-ray of the breasts to look for changes that are not normal. The X-ray image is called a mammogram. This procedure can screen for breast cancer, can detect cancer early, and can diagnose cancer.  LET YOUR CAREGIVER KNOW ABOUT:  Breast implants.  Previous breast disease, biopsy, or surgery.  If you are breastfeeding.  Medicines taken, including vitamins, herbs, eyedrops, over-the-counter medicines, and creams.  Use of steroids (by mouth or creams).  Possibility of pregnancy, if this applies. RISKS AND COMPLICATIONS  Exposure to radiation, but at very low levels.  The results may be misinterpreted.  The results may not be accurate.  Mammography may lead to further tests.  Mammography may not catch certain cancers. BEFORE THE PROCEDURE  Schedule your test about 7 days after your menstrual period. This is when your breasts are the least tender and have signs of hormone changes.  If you have had a mammography done at a different facility in the past, get the mammogram X-rays or have them sent to your current exam facility in order to compare them.  Wash your breasts and under your arms the day of the test.  Do not wear deodorants, perfumes, or powders anywhere on your body.  Wear clothes that you can change in and out of easily. PROCEDURE Relax as much as possible during the test. Any discomfort during the test will be very brief. The test should take less than 30 minutes. The following will happen:  You will undress from the waist up and put on a gown.  You will stand in front of the X-ray machine.  Each breast will be placed between 2 plastic or glass plates. The plates will compress your breast for a few seconds.  X-rays will be taken from different angles of the breast. AFTER THE PROCEDURE  The mammogram will be examined.  Depending on the quality of the images, you may need to repeat certain parts of the test.  Ask when your test  results will be ready. Make sure you get your test results.  You may resume normal activities. Document Released: 03/31/2000 Document Revised: 06/26/2011 Document Reviewed: 01/22/2011 ExitCare Patient Information 2015 [employer]. This information is not intended to replace advice given to you by your health care provider. Make sure you discuss any questions you have with your health care provider.  

## 2014-04-13 NOTE — Addendum Note (Signed)
Addended by: Jearld Fenton on: 04/13/2014 02:55 PM   Modules accepted: Orders

## 2014-04-17 DIAGNOSIS — C50919 Malignant neoplasm of unspecified site of unspecified female breast: Secondary | ICD-10-CM

## 2014-04-17 HISTORY — PX: BREAST BIOPSY: SHX20

## 2014-04-17 HISTORY — DX: Malignant neoplasm of unspecified site of unspecified female breast: C50.919

## 2014-04-20 ENCOUNTER — Telehealth: Payer: Self-pay | Admitting: Family Medicine

## 2014-04-20 DIAGNOSIS — M810 Age-related osteoporosis without current pathological fracture: Secondary | ICD-10-CM

## 2014-04-20 DIAGNOSIS — E785 Hyperlipidemia, unspecified: Secondary | ICD-10-CM

## 2014-04-20 DIAGNOSIS — E038 Other specified hypothyroidism: Secondary | ICD-10-CM

## 2014-04-20 NOTE — Telephone Encounter (Signed)
-----   Message from Ellamae Sia sent at 04/14/2014 11:32 AM EST ----- Regarding: Lab orders for Tuesday, 1.5.16 Patient is scheduled for CPX labs, please order future labs, Thanks , Karna Christmas

## 2014-04-21 ENCOUNTER — Other Ambulatory Visit (INDEPENDENT_AMBULATORY_CARE_PROVIDER_SITE_OTHER): Payer: Commercial Managed Care - HMO

## 2014-04-21 DIAGNOSIS — E038 Other specified hypothyroidism: Secondary | ICD-10-CM | POA: Diagnosis not present

## 2014-04-21 DIAGNOSIS — M810 Age-related osteoporosis without current pathological fracture: Secondary | ICD-10-CM

## 2014-04-21 DIAGNOSIS — E785 Hyperlipidemia, unspecified: Secondary | ICD-10-CM

## 2014-04-21 LAB — LIPID PANEL
CHOLESTEROL: 132 mg/dL (ref 0–200)
HDL: 44.2 mg/dL (ref >39.00)
LDL Cholesterol: 59 mg/dL (ref 0–99)
NONHDL: 87.8
Total CHOL/HDL Ratio: 3
Triglycerides: 142 mg/dL (ref 0.0–149.0)
VLDL: 28.4 mg/dL (ref 0.0–40.0)

## 2014-04-21 LAB — COMPREHENSIVE METABOLIC PANEL
ALBUMIN: 4.4 g/dL (ref 3.5–5.2)
ALK PHOS: 87 U/L (ref 39–117)
ALT: 14 U/L (ref 0–35)
AST: 23 U/L (ref 0–37)
BILIRUBIN TOTAL: 0.9 mg/dL (ref 0.2–1.2)
BUN: 16 mg/dL (ref 6–23)
CO2: 28 meq/L (ref 19–32)
Calcium: 9.3 mg/dL (ref 8.4–10.5)
Chloride: 104 mEq/L (ref 96–112)
Creatinine, Ser: 0.8 mg/dL (ref 0.4–1.2)
GFR: 70.37 mL/min (ref >60.00)
Glucose, Bld: 96 mg/dL (ref 70–99)
POTASSIUM: 3.6 meq/L (ref 3.5–5.1)
Sodium: 138 mEq/L (ref 135–145)
TOTAL PROTEIN: 7.6 g/dL (ref 6.0–8.3)

## 2014-04-21 LAB — TSH: TSH: 0.62 u[IU]/mL (ref 0.35–4.50)

## 2014-04-21 LAB — VITAMIN D 25 HYDROXY (VIT D DEFICIENCY, FRACTURES): VITD: 26.3 ng/mL — ABNORMAL LOW (ref 30.00–100.00)

## 2014-04-23 ENCOUNTER — Ambulatory Visit: Payer: Self-pay | Admitting: Internal Medicine

## 2014-04-23 DIAGNOSIS — N63 Unspecified lump in breast: Secondary | ICD-10-CM | POA: Diagnosis not present

## 2014-04-24 ENCOUNTER — Encounter: Payer: Self-pay | Admitting: Internal Medicine

## 2014-04-28 ENCOUNTER — Encounter: Payer: Self-pay | Admitting: Family Medicine

## 2014-04-28 ENCOUNTER — Ambulatory Visit (INDEPENDENT_AMBULATORY_CARE_PROVIDER_SITE_OTHER): Payer: Commercial Managed Care - HMO | Admitting: Family Medicine

## 2014-04-28 VITALS — BP 160/70 | HR 64 | Temp 98.3°F | Ht 65.5 in | Wt 130.2 lb

## 2014-04-28 DIAGNOSIS — I10 Essential (primary) hypertension: Secondary | ICD-10-CM

## 2014-04-28 DIAGNOSIS — R05 Cough: Secondary | ICD-10-CM

## 2014-04-28 DIAGNOSIS — Z0000 Encounter for general adult medical examination without abnormal findings: Secondary | ICD-10-CM

## 2014-04-28 DIAGNOSIS — E038 Other specified hypothyroidism: Secondary | ICD-10-CM

## 2014-04-28 DIAGNOSIS — E559 Vitamin D deficiency, unspecified: Secondary | ICD-10-CM

## 2014-04-28 DIAGNOSIS — R053 Chronic cough: Secondary | ICD-10-CM

## 2014-04-28 DIAGNOSIS — Z7189 Other specified counseling: Secondary | ICD-10-CM

## 2014-04-28 DIAGNOSIS — E785 Hyperlipidemia, unspecified: Secondary | ICD-10-CM

## 2014-04-28 MED ORDER — VITAMIN D3 1.25 MG (50000 UT) PO CAPS: ORAL_CAPSULE | ORAL | Status: DC

## 2014-04-28 NOTE — Assessment & Plan Note (Signed)
Well controlled. Continue current medication. Encouraged exercise, weight loss, healthy eating habits.  

## 2014-04-28 NOTE — Assessment & Plan Note (Signed)
Improved some off benazapril.

## 2014-04-28 NOTE — Assessment & Plan Note (Signed)
Stable control. 

## 2014-04-28 NOTE — Progress Notes (Signed)
Pre visit review using our clinic review tool, if applicable. No additional management support is needed unless otherwise documented below in the visit note. 

## 2014-04-28 NOTE — Assessment & Plan Note (Signed)
Well controlled when takes medicaiton. Continue current medication.

## 2014-04-28 NOTE — Progress Notes (Signed)
HPI  I have personally reviewed the Medicare Annual Wellness questionnaire and have noted  1. The patient's medical and social history  2. Their use of alcohol, tobacco or illicit drugs  3. Their current medications and supplements  4. The patient's functional ability including ADL's, fall risks, home safety risks and hearing or visual  impairment.  5. Diet and physical activities  6. Evidence for depression or mood disorders  The patients weight, height, BMI and visual acuity have been recorded in the chart  I have made referrals, counseling and provided education to the patient based review of the above and I have provided the pt with a written personalized care plan for preventive services.   She has been feeling tired in last 2-3 weeks. She currently undergoing breast mass eval.  Has breast bx schedule in 2 days. She coughing some in last several months. At cards visit in 02/2015. Changed from benazapril to losartan. This has helped some with cough. Mild SOB with exertion, walking. Reviewed OV by Dr. Rockey [facility].  Hypertension: Well controlled previously on current meds: atenolol, lotensin.  ( She has not eaten BP med yet today) BP Readings from Last 3 Encounters:  04/28/14 160/70  04/13/14 132/64  04/08/14 120/71  Using medication without problems or lightheadedness: No Chest pain with exertion:None  Edema:None  Short of breath:mild  Average home BPs: well controlled  Other issues:    Elevated Cholesterol: LDL at goal <70 on simvastatin 80 mg daily  Lab Results  Component Value Date   CHOL 132 04/21/2014   HDL 44.20 04/21/2014   LDLCALC 59 04/21/2014   TRIG 142.0 04/21/2014   CHOLHDL 3 04/21/2014  Using medications without problems:None  Muscle aches: None  Diet compliance:Eating a lot of fresh fruit and veggies.  Exercise: walks a lot, cutting wood  Other complaints:   Hypothyroid: Stable control on current dose  Lab Results  Component  Value Date   TSH 0.62 04/21/2014   COPD, on spiriva: Daily mild SOB with exertion despite this.  Vit D is low.. May be causing some of her fatigue.  Review of Systems  Constitutional: Negative for fever and fatigue.  HENT: Negative for ear pain.  Eyes: Negative for pain.  Respiratory: No cough, no PND Cardiovascular: Negative for chest pain, palpitations and leg swelling.  Gastrointestinal: Negative for abdominal pain.  Genitourinary: Negative for dysuria.  Objective:   Physical Exam  Constitutional: Vital signs are normal. She appears well-developed and well-nourished. She is cooperative. Non-toxic appearance. She does not appear ill. No distress.  HENT:  Head: Normocephalic.  Right Ear: Hearing, tympanic membrane, external ear and ear canal normal. Tympanic membrane is not erythematous, not retracted and not bulging.  Left Ear: Hearing, tympanic membrane, external ear and ear canal normal. Tympanic membrane is not erythematous, not retracted and not bulging.  Nose: Mucosal edema and rhinorrhea present. Right sinus exhibits no maxillary sinus tenderness and no frontal sinus tenderness. Left sinus exhibits no maxillary sinus tenderness and no frontal sinus tenderness.  Mouth/Throat: Uvula is midline, oropharynx is clear and moist and mucous membranes are normal.  Eyes: Conjunctivae normal, EOM and lids are normal. Pupils are equal, round, and reactive to light. No foreign bodies found.  Neck: Trachea normal and normal range of motion. Neck supple. Carotid bruit is not present. No mass and no thyromegaly present.  Cardiovascular: Normal rate, regular rhythm, S1 normal, S2 normal, normal heart sounds, intact distal pulses and normal pulses. Exam reveals no gallop and no  friction rub.  No murmur heard.  Pulmonary/Chest: Effort normal and breath sounds normal. Not tachypneic. No respiratory distress. She has no decreased breath sounds. She has no wheezes. She has no rhonchi.  She has no rales.  Abdominal: Soft. Normal appearance and bowel sounds are normal. She exhibits no distension, no fluid wave, no abdominal bruit and no mass. There is no hepatosplenomegaly. There is no tenderness. There is no rebound, no guarding and no CVA tenderness. No hernia.  Genitourinary: No breast swelling, tenderness, discharge or bleeding. Pelvic exam was performed with patient supine.  Lymphadenopathy:  She has no cervical adenopathy.  She has no axillary adenopathy.  Neurological: She is alert. She has normal strength. No cranial nerve deficit or sensory deficit.  Skin: Skin is warm, dry and intact. No rash noted.  Psychiatric: Her speech is normal and behavior is normal. Judgment normal. Her mood appears not anxious. Cognition and memory are normal. She does not exhibit a depressed mood.  Assessment & Plan:   The patient's preventative maintenance and recommended screening tests for an annual wellness exam were reviewed in full today.  Brought up to date unless services declined.  Counselled on the importance of diet, exercise, and its role in overall health and mortality.  The patient's FH and SH was reviewed, including their home life, tobacco status, and drug and alcohol status.   Former smoker  Vaccines:Uptodate flu and prevnar. Mammogram: 04/2014: suspicious mass on [place], mammo, rec biopsy Colon: 2009 Dr. Kristopher Glee, polyp, due for repeat in 2019 DEXA: 2012, not interested in repeating. DVE/PAP: not indicated, TAH for non-cancer reasons.

## 2014-04-28 NOTE — Assessment & Plan Note (Signed)
Replete

## 2014-04-28 NOTE — Patient Instructions (Addendum)
Take BP med when you get home. Start vit D 50, 000  Weekly x 12 weeks then start OTC vit D 400 IU twice daily.

## 2014-04-30 DIAGNOSIS — C50911 Malignant neoplasm of unspecified site of right female breast: Secondary | ICD-10-CM | POA: Diagnosis not present

## 2014-04-30 DIAGNOSIS — N63 Unspecified lump in breast: Secondary | ICD-10-CM | POA: Diagnosis not present

## 2014-05-01 ENCOUNTER — Encounter: Payer: Self-pay | Admitting: Family Medicine

## 2014-05-04 ENCOUNTER — Telehealth: Payer: Self-pay | Admitting: *Deleted

## 2014-05-04 NOTE — Telephone Encounter (Signed)
Patient has ductal carcinoma in situ.  Report to follow.

## 2014-05-05 NOTE — Telephone Encounter (Signed)
Noted and appreciated

## 2014-05-05 NOTE — Telephone Encounter (Signed)
Amy Stone with [facility] said pt and her daughter Amy Stone spoke with Dr Brigitte Pulse this morning and given results; Amy Stone pts daughter and also works for Dr Rochel Brome at [facility] will schedule appt with Dr Rochel Brome for surgery. An Addendum will follow.

## 2014-05-06 ENCOUNTER — Encounter: Payer: Self-pay | Admitting: Internal Medicine

## 2014-05-12 ENCOUNTER — Telehealth: Payer: Self-pay | Admitting: Cardiovascular Disease

## 2014-05-12 DIAGNOSIS — C50111 Malignant neoplasm of central portion of right female breast: Secondary | ICD-10-CM | POA: Diagnosis not present

## 2014-05-12 NOTE — Telephone Encounter (Signed)
Request for surgical clearance:  1. What type of surgery is being performed? Right partial mastomoey   2. When is this surgery scheduled? Not yet  3. Are there any medications that need to be held prior to surgery and how long? asprin 7 days prior  4. Name of physician performing surgery? [place]  5. What is your office phone and fax number? [phone].

## 2014-05-12 NOTE — Telephone Encounter (Signed)
We recommend the patient continue aspirin for her procedure from a cardiac standpoint. It is the surgeon's call if they want to discontinue it or not however we cannot advise they discontinue it.

## 2014-05-13 NOTE — Telephone Encounter (Signed)
Spoke w/ Jocelyn Lamer @ Dr. Thompson Caul office. Advised her of [name]'s recommendation.  She apologizes, as she was requesting cardiac clearance, as well.  She reports that pt was recently dx w/ breast ca and will need a mastectomy. She would like to know if pt is cleared to proceed w/ surgery or if she will need a another visit.  Please advise.  Thank you.

## 2014-05-14 NOTE — Telephone Encounter (Signed)
Patient is cleared at a moderate risk for non-cardiac surgery. The estimated risk of MI, PE, v-fib, cardaic arrest, complete heart block, or other arrhythmia is estimated at 6.6% based on Lee Criteria.

## 2014-05-14 NOTE — Telephone Encounter (Signed)
Spoke w/ Jocelyn Lamer.  Advised her of [name]'s recommendation.  She asks that I fax recommendation over in writing for their records.  Faxed to [phone].

## 2014-05-18 DIAGNOSIS — D0592 Unspecified type of carcinoma in situ of left breast: Secondary | ICD-10-CM

## 2014-05-18 HISTORY — DX: Unspecified type of carcinoma in situ of left breast: D05.92

## 2014-05-18 HISTORY — PX: BREAST LUMPECTOMY: SHX2

## 2014-05-19 ENCOUNTER — Ambulatory Visit: Payer: Self-pay | Admitting: Oncology

## 2014-05-19 DIAGNOSIS — I2510 Atherosclerotic heart disease of native coronary artery without angina pectoris: Secondary | ICD-10-CM | POA: Diagnosis not present

## 2014-05-19 DIAGNOSIS — Z8551 Personal history of malignant neoplasm of bladder: Secondary | ICD-10-CM | POA: Diagnosis not present

## 2014-05-19 DIAGNOSIS — D0511 Intraductal carcinoma in situ of right breast: Secondary | ICD-10-CM | POA: Diagnosis not present

## 2014-05-19 DIAGNOSIS — Z79899 Other long term (current) drug therapy: Secondary | ICD-10-CM | POA: Diagnosis not present

## 2014-05-19 DIAGNOSIS — I252 Old myocardial infarction: Secondary | ICD-10-CM | POA: Diagnosis not present

## 2014-05-19 DIAGNOSIS — M810 Age-related osteoporosis without current pathological fracture: Secondary | ICD-10-CM | POA: Diagnosis not present

## 2014-05-19 DIAGNOSIS — J449 Chronic obstructive pulmonary disease, unspecified: Secondary | ICD-10-CM | POA: Diagnosis not present

## 2014-05-21 ENCOUNTER — Ambulatory Visit: Payer: Self-pay | Admitting: Surgery

## 2014-05-21 DIAGNOSIS — Z01812 Encounter for preprocedural laboratory examination: Secondary | ICD-10-CM | POA: Diagnosis not present

## 2014-05-21 DIAGNOSIS — C50111 Malignant neoplasm of central portion of right female breast: Secondary | ICD-10-CM | POA: Diagnosis not present

## 2014-05-21 LAB — CBC WITH DIFFERENTIAL/PLATELET
Basophil #: 0.1 10*3/uL (ref 0.0–0.1)
Basophil %: 0.6 %
EOS ABS: 0.2 10*3/uL (ref 0.0–0.7)
Eosinophil %: 1.9 %
HCT: 37.1 % (ref 35.0–47.0)
HGB: 12.3 g/dL (ref 12.0–16.0)
LYMPHS PCT: 25.4 %
Lymphocyte #: 2.3 10*3/uL (ref 1.0–3.6)
MCH: 29.4 pg (ref 26.0–34.0)
MCHC: 33.3 g/dL (ref 32.0–36.0)
MCV: 88 fL (ref 80–100)
MONO ABS: 0.8 x10 3/mm (ref 0.2–0.9)
Monocyte %: 8.8 %
NEUTROS ABS: 5.7 10*3/uL (ref 1.4–6.5)
Neutrophil %: 63.3 %
Platelet: 230 10*3/uL (ref 150–440)
RBC: 4.2 10*6/uL (ref 3.80–5.20)
RDW: 14.6 % — AB (ref 11.5–14.5)
WBC: 8.9 10*3/uL (ref 3.6–11.0)

## 2014-05-21 LAB — BASIC METABOLIC PANEL
Anion Gap: 6 — ABNORMAL LOW (ref 7–16)
BUN: 7 mg/dL (ref 7–18)
CALCIUM: 9.3 mg/dL (ref 8.5–10.1)
CO2: 28 mmol/L (ref 21–32)
Chloride: 105 mmol/L (ref 98–107)
Creatinine: 0.94 mg/dL (ref 0.60–1.30)
EGFR (African American): >60
GLUCOSE: 106 mg/dL — AB (ref 65–99)
OSMOLALITY: 276 (ref 275–301)
Potassium: 4.1 mmol/L (ref 3.5–5.1)
SODIUM: 139 mmol/L (ref 136–145)

## 2014-06-02 ENCOUNTER — Ambulatory Visit: Payer: Self-pay | Admitting: Surgery

## 2014-06-02 DIAGNOSIS — N63 Unspecified lump in breast: Secondary | ICD-10-CM | POA: Diagnosis not present

## 2014-06-02 DIAGNOSIS — M1990 Unspecified osteoarthritis, unspecified site: Secondary | ICD-10-CM | POA: Diagnosis not present

## 2014-06-02 DIAGNOSIS — Z853 Personal history of malignant neoplasm of breast: Secondary | ICD-10-CM | POA: Insufficient documentation

## 2014-06-02 DIAGNOSIS — C50111 Malignant neoplasm of central portion of right female breast: Secondary | ICD-10-CM | POA: Diagnosis not present

## 2014-06-02 DIAGNOSIS — Z79899 Other long term (current) drug therapy: Secondary | ICD-10-CM | POA: Diagnosis not present

## 2014-06-02 DIAGNOSIS — G629 Polyneuropathy, unspecified: Secondary | ICD-10-CM | POA: Diagnosis not present

## 2014-06-02 DIAGNOSIS — Z87891 Personal history of nicotine dependence: Secondary | ICD-10-CM | POA: Diagnosis not present

## 2014-06-02 DIAGNOSIS — Z91041 Radiographic dye allergy status: Secondary | ICD-10-CM | POA: Diagnosis not present

## 2014-06-02 DIAGNOSIS — C50911 Malignant neoplasm of unspecified site of right female breast: Secondary | ICD-10-CM | POA: Diagnosis not present

## 2014-06-02 DIAGNOSIS — I252 Old myocardial infarction: Secondary | ICD-10-CM | POA: Diagnosis not present

## 2014-06-02 DIAGNOSIS — Z888 Allergy status to other drugs, medicaments and biological substances status: Secondary | ICD-10-CM | POA: Diagnosis not present

## 2014-06-02 DIAGNOSIS — I10 Essential (primary) hypertension: Secondary | ICD-10-CM | POA: Diagnosis not present

## 2014-06-05 ENCOUNTER — Encounter: Payer: Self-pay | Admitting: Family Medicine

## 2014-06-16 ENCOUNTER — Ambulatory Visit
Admit: 2014-06-16 | Disposition: A | Discharge status: Home / Self Care | Payer: Self-pay | Attending: Oncology | Admitting: Oncology

## 2014-06-16 DIAGNOSIS — I252 Old myocardial infarction: Secondary | ICD-10-CM | POA: Diagnosis not present

## 2014-06-16 DIAGNOSIS — I2510 Atherosclerotic heart disease of native coronary artery without angina pectoris: Secondary | ICD-10-CM | POA: Diagnosis not present

## 2014-06-16 DIAGNOSIS — D0511 Intraductal carcinoma in situ of right breast: Secondary | ICD-10-CM | POA: Diagnosis not present

## 2014-06-16 DIAGNOSIS — M810 Age-related osteoporosis without current pathological fracture: Secondary | ICD-10-CM | POA: Diagnosis not present

## 2014-06-16 DIAGNOSIS — Z8551 Personal history of malignant neoplasm of bladder: Secondary | ICD-10-CM | POA: Diagnosis not present

## 2014-06-16 DIAGNOSIS — Z510 Encounter for antineoplastic radiation therapy: Secondary | ICD-10-CM | POA: Diagnosis not present

## 2014-06-16 DIAGNOSIS — Z79899 Other long term (current) drug therapy: Secondary | ICD-10-CM | POA: Diagnosis not present

## 2014-06-16 DIAGNOSIS — J449 Chronic obstructive pulmonary disease, unspecified: Secondary | ICD-10-CM | POA: Diagnosis not present

## 2014-06-18 DIAGNOSIS — I2510 Atherosclerotic heart disease of native coronary artery without angina pectoris: Secondary | ICD-10-CM | POA: Diagnosis not present

## 2014-06-18 DIAGNOSIS — Z510 Encounter for antineoplastic radiation therapy: Secondary | ICD-10-CM | POA: Diagnosis not present

## 2014-06-18 DIAGNOSIS — Z79899 Other long term (current) drug therapy: Secondary | ICD-10-CM | POA: Diagnosis not present

## 2014-06-18 DIAGNOSIS — I252 Old myocardial infarction: Secondary | ICD-10-CM | POA: Diagnosis not present

## 2014-06-18 DIAGNOSIS — M810 Age-related osteoporosis without current pathological fracture: Secondary | ICD-10-CM | POA: Diagnosis not present

## 2014-06-18 DIAGNOSIS — Z8551 Personal history of malignant neoplasm of bladder: Secondary | ICD-10-CM | POA: Diagnosis not present

## 2014-06-18 DIAGNOSIS — J449 Chronic obstructive pulmonary disease, unspecified: Secondary | ICD-10-CM | POA: Diagnosis not present

## 2014-06-18 DIAGNOSIS — D0511 Intraductal carcinoma in situ of right breast: Secondary | ICD-10-CM | POA: Diagnosis not present

## 2014-06-19 DIAGNOSIS — C50919 Malignant neoplasm of unspecified site of unspecified female breast: Secondary | ICD-10-CM | POA: Diagnosis not present

## 2014-06-19 DIAGNOSIS — Z803 Family history of malignant neoplasm of breast: Secondary | ICD-10-CM | POA: Diagnosis not present

## 2014-06-23 DIAGNOSIS — J449 Chronic obstructive pulmonary disease, unspecified: Secondary | ICD-10-CM | POA: Diagnosis not present

## 2014-06-23 DIAGNOSIS — Z79899 Other long term (current) drug therapy: Secondary | ICD-10-CM | POA: Diagnosis not present

## 2014-06-23 DIAGNOSIS — Z8551 Personal history of malignant neoplasm of bladder: Secondary | ICD-10-CM | POA: Diagnosis not present

## 2014-06-23 DIAGNOSIS — Z510 Encounter for antineoplastic radiation therapy: Secondary | ICD-10-CM | POA: Diagnosis not present

## 2014-06-23 DIAGNOSIS — M810 Age-related osteoporosis without current pathological fracture: Secondary | ICD-10-CM | POA: Diagnosis not present

## 2014-06-23 DIAGNOSIS — I252 Old myocardial infarction: Secondary | ICD-10-CM | POA: Diagnosis not present

## 2014-06-23 DIAGNOSIS — D0511 Intraductal carcinoma in situ of right breast: Secondary | ICD-10-CM | POA: Diagnosis not present

## 2014-06-23 DIAGNOSIS — I2510 Atherosclerotic heart disease of native coronary artery without angina pectoris: Secondary | ICD-10-CM | POA: Diagnosis not present

## 2014-06-26 DIAGNOSIS — J449 Chronic obstructive pulmonary disease, unspecified: Secondary | ICD-10-CM | POA: Diagnosis not present

## 2014-06-26 DIAGNOSIS — M810 Age-related osteoporosis without current pathological fracture: Secondary | ICD-10-CM | POA: Diagnosis not present

## 2014-06-26 DIAGNOSIS — Z8551 Personal history of malignant neoplasm of bladder: Secondary | ICD-10-CM | POA: Diagnosis not present

## 2014-06-26 DIAGNOSIS — I252 Old myocardial infarction: Secondary | ICD-10-CM | POA: Diagnosis not present

## 2014-06-26 DIAGNOSIS — D0511 Intraductal carcinoma in situ of right breast: Secondary | ICD-10-CM | POA: Diagnosis not present

## 2014-06-26 DIAGNOSIS — Z510 Encounter for antineoplastic radiation therapy: Secondary | ICD-10-CM | POA: Diagnosis not present

## 2014-06-26 DIAGNOSIS — I2510 Atherosclerotic heart disease of native coronary artery without angina pectoris: Secondary | ICD-10-CM | POA: Diagnosis not present

## 2014-06-26 DIAGNOSIS — Z79899 Other long term (current) drug therapy: Secondary | ICD-10-CM | POA: Diagnosis not present

## 2014-06-30 DIAGNOSIS — M810 Age-related osteoporosis without current pathological fracture: Secondary | ICD-10-CM | POA: Diagnosis not present

## 2014-06-30 DIAGNOSIS — D0511 Intraductal carcinoma in situ of right breast: Secondary | ICD-10-CM | POA: Diagnosis not present

## 2014-06-30 DIAGNOSIS — Z510 Encounter for antineoplastic radiation therapy: Secondary | ICD-10-CM | POA: Diagnosis not present

## 2014-06-30 DIAGNOSIS — I2510 Atherosclerotic heart disease of native coronary artery without angina pectoris: Secondary | ICD-10-CM | POA: Diagnosis not present

## 2014-06-30 DIAGNOSIS — I252 Old myocardial infarction: Secondary | ICD-10-CM | POA: Diagnosis not present

## 2014-06-30 DIAGNOSIS — Z79899 Other long term (current) drug therapy: Secondary | ICD-10-CM | POA: Diagnosis not present

## 2014-06-30 DIAGNOSIS — Z8551 Personal history of malignant neoplasm of bladder: Secondary | ICD-10-CM | POA: Diagnosis not present

## 2014-06-30 DIAGNOSIS — J449 Chronic obstructive pulmonary disease, unspecified: Secondary | ICD-10-CM | POA: Diagnosis not present

## 2014-07-01 DIAGNOSIS — Z510 Encounter for antineoplastic radiation therapy: Secondary | ICD-10-CM | POA: Diagnosis not present

## 2014-07-01 DIAGNOSIS — J449 Chronic obstructive pulmonary disease, unspecified: Secondary | ICD-10-CM | POA: Diagnosis not present

## 2014-07-01 DIAGNOSIS — I2510 Atherosclerotic heart disease of native coronary artery without angina pectoris: Secondary | ICD-10-CM | POA: Diagnosis not present

## 2014-07-01 DIAGNOSIS — Z8551 Personal history of malignant neoplasm of bladder: Secondary | ICD-10-CM | POA: Diagnosis not present

## 2014-07-01 DIAGNOSIS — M810 Age-related osteoporosis without current pathological fracture: Secondary | ICD-10-CM | POA: Diagnosis not present

## 2014-07-01 DIAGNOSIS — D0511 Intraductal carcinoma in situ of right breast: Secondary | ICD-10-CM | POA: Diagnosis not present

## 2014-07-01 DIAGNOSIS — Z79899 Other long term (current) drug therapy: Secondary | ICD-10-CM | POA: Diagnosis not present

## 2014-07-01 DIAGNOSIS — I252 Old myocardial infarction: Secondary | ICD-10-CM | POA: Diagnosis not present

## 2014-07-02 DIAGNOSIS — Z79899 Other long term (current) drug therapy: Secondary | ICD-10-CM | POA: Diagnosis not present

## 2014-07-02 DIAGNOSIS — Z8551 Personal history of malignant neoplasm of bladder: Secondary | ICD-10-CM | POA: Diagnosis not present

## 2014-07-02 DIAGNOSIS — I2510 Atherosclerotic heart disease of native coronary artery without angina pectoris: Secondary | ICD-10-CM | POA: Diagnosis not present

## 2014-07-02 DIAGNOSIS — M810 Age-related osteoporosis without current pathological fracture: Secondary | ICD-10-CM | POA: Diagnosis not present

## 2014-07-02 DIAGNOSIS — I252 Old myocardial infarction: Secondary | ICD-10-CM | POA: Diagnosis not present

## 2014-07-02 DIAGNOSIS — J449 Chronic obstructive pulmonary disease, unspecified: Secondary | ICD-10-CM | POA: Diagnosis not present

## 2014-07-02 DIAGNOSIS — Z510 Encounter for antineoplastic radiation therapy: Secondary | ICD-10-CM | POA: Diagnosis not present

## 2014-07-02 DIAGNOSIS — D0511 Intraductal carcinoma in situ of right breast: Secondary | ICD-10-CM | POA: Diagnosis not present

## 2014-07-03 DIAGNOSIS — Z8551 Personal history of malignant neoplasm of bladder: Secondary | ICD-10-CM | POA: Diagnosis not present

## 2014-07-03 DIAGNOSIS — J449 Chronic obstructive pulmonary disease, unspecified: Secondary | ICD-10-CM | POA: Diagnosis not present

## 2014-07-03 DIAGNOSIS — M810 Age-related osteoporosis without current pathological fracture: Secondary | ICD-10-CM | POA: Diagnosis not present

## 2014-07-03 DIAGNOSIS — D0511 Intraductal carcinoma in situ of right breast: Secondary | ICD-10-CM | POA: Diagnosis not present

## 2014-07-03 DIAGNOSIS — Z510 Encounter for antineoplastic radiation therapy: Secondary | ICD-10-CM | POA: Diagnosis not present

## 2014-07-03 DIAGNOSIS — I2510 Atherosclerotic heart disease of native coronary artery without angina pectoris: Secondary | ICD-10-CM | POA: Diagnosis not present

## 2014-07-03 DIAGNOSIS — I252 Old myocardial infarction: Secondary | ICD-10-CM | POA: Diagnosis not present

## 2014-07-03 DIAGNOSIS — Z79899 Other long term (current) drug therapy: Secondary | ICD-10-CM | POA: Diagnosis not present

## 2014-07-06 DIAGNOSIS — J449 Chronic obstructive pulmonary disease, unspecified: Secondary | ICD-10-CM | POA: Diagnosis not present

## 2014-07-06 DIAGNOSIS — Z79899 Other long term (current) drug therapy: Secondary | ICD-10-CM | POA: Diagnosis not present

## 2014-07-06 DIAGNOSIS — I252 Old myocardial infarction: Secondary | ICD-10-CM | POA: Diagnosis not present

## 2014-07-06 DIAGNOSIS — M810 Age-related osteoporosis without current pathological fracture: Secondary | ICD-10-CM | POA: Diagnosis not present

## 2014-07-06 DIAGNOSIS — I2510 Atherosclerotic heart disease of native coronary artery without angina pectoris: Secondary | ICD-10-CM | POA: Diagnosis not present

## 2014-07-06 DIAGNOSIS — D0511 Intraductal carcinoma in situ of right breast: Secondary | ICD-10-CM | POA: Diagnosis not present

## 2014-07-06 DIAGNOSIS — Z510 Encounter for antineoplastic radiation therapy: Secondary | ICD-10-CM | POA: Diagnosis not present

## 2014-07-06 DIAGNOSIS — Z8551 Personal history of malignant neoplasm of bladder: Secondary | ICD-10-CM | POA: Diagnosis not present

## 2014-07-07 DIAGNOSIS — Z79899 Other long term (current) drug therapy: Secondary | ICD-10-CM | POA: Diagnosis not present

## 2014-07-07 DIAGNOSIS — M810 Age-related osteoporosis without current pathological fracture: Secondary | ICD-10-CM | POA: Diagnosis not present

## 2014-07-07 DIAGNOSIS — Z510 Encounter for antineoplastic radiation therapy: Secondary | ICD-10-CM | POA: Diagnosis not present

## 2014-07-07 DIAGNOSIS — J449 Chronic obstructive pulmonary disease, unspecified: Secondary | ICD-10-CM | POA: Diagnosis not present

## 2014-07-07 DIAGNOSIS — D0511 Intraductal carcinoma in situ of right breast: Secondary | ICD-10-CM | POA: Diagnosis not present

## 2014-07-07 DIAGNOSIS — I252 Old myocardial infarction: Secondary | ICD-10-CM | POA: Diagnosis not present

## 2014-07-07 DIAGNOSIS — I2510 Atherosclerotic heart disease of native coronary artery without angina pectoris: Secondary | ICD-10-CM | POA: Diagnosis not present

## 2014-07-07 DIAGNOSIS — Z8551 Personal history of malignant neoplasm of bladder: Secondary | ICD-10-CM | POA: Diagnosis not present

## 2014-07-08 DIAGNOSIS — Z79899 Other long term (current) drug therapy: Secondary | ICD-10-CM | POA: Diagnosis not present

## 2014-07-08 DIAGNOSIS — Z510 Encounter for antineoplastic radiation therapy: Secondary | ICD-10-CM | POA: Diagnosis not present

## 2014-07-08 DIAGNOSIS — D0511 Intraductal carcinoma in situ of right breast: Secondary | ICD-10-CM | POA: Diagnosis not present

## 2014-07-08 DIAGNOSIS — M810 Age-related osteoporosis without current pathological fracture: Secondary | ICD-10-CM | POA: Diagnosis not present

## 2014-07-08 DIAGNOSIS — J449 Chronic obstructive pulmonary disease, unspecified: Secondary | ICD-10-CM | POA: Diagnosis not present

## 2014-07-08 DIAGNOSIS — Z8551 Personal history of malignant neoplasm of bladder: Secondary | ICD-10-CM | POA: Diagnosis not present

## 2014-07-08 DIAGNOSIS — I2510 Atherosclerotic heart disease of native coronary artery without angina pectoris: Secondary | ICD-10-CM | POA: Diagnosis not present

## 2014-07-08 DIAGNOSIS — I252 Old myocardial infarction: Secondary | ICD-10-CM | POA: Diagnosis not present

## 2014-07-09 DIAGNOSIS — Z510 Encounter for antineoplastic radiation therapy: Secondary | ICD-10-CM | POA: Diagnosis not present

## 2014-07-09 DIAGNOSIS — Z79899 Other long term (current) drug therapy: Secondary | ICD-10-CM | POA: Diagnosis not present

## 2014-07-09 DIAGNOSIS — D0511 Intraductal carcinoma in situ of right breast: Secondary | ICD-10-CM | POA: Diagnosis not present

## 2014-07-09 DIAGNOSIS — I2510 Atherosclerotic heart disease of native coronary artery without angina pectoris: Secondary | ICD-10-CM | POA: Diagnosis not present

## 2014-07-09 DIAGNOSIS — I252 Old myocardial infarction: Secondary | ICD-10-CM | POA: Diagnosis not present

## 2014-07-09 DIAGNOSIS — J449 Chronic obstructive pulmonary disease, unspecified: Secondary | ICD-10-CM | POA: Diagnosis not present

## 2014-07-09 DIAGNOSIS — M810 Age-related osteoporosis without current pathological fracture: Secondary | ICD-10-CM | POA: Diagnosis not present

## 2014-07-09 DIAGNOSIS — Z8551 Personal history of malignant neoplasm of bladder: Secondary | ICD-10-CM | POA: Diagnosis not present

## 2014-07-10 DIAGNOSIS — J449 Chronic obstructive pulmonary disease, unspecified: Secondary | ICD-10-CM | POA: Diagnosis not present

## 2014-07-10 DIAGNOSIS — Z510 Encounter for antineoplastic radiation therapy: Secondary | ICD-10-CM | POA: Diagnosis not present

## 2014-07-10 DIAGNOSIS — Z8551 Personal history of malignant neoplasm of bladder: Secondary | ICD-10-CM | POA: Diagnosis not present

## 2014-07-10 DIAGNOSIS — Z79899 Other long term (current) drug therapy: Secondary | ICD-10-CM | POA: Diagnosis not present

## 2014-07-10 DIAGNOSIS — D0511 Intraductal carcinoma in situ of right breast: Secondary | ICD-10-CM | POA: Diagnosis not present

## 2014-07-10 DIAGNOSIS — M810 Age-related osteoporosis without current pathological fracture: Secondary | ICD-10-CM | POA: Diagnosis not present

## 2014-07-10 DIAGNOSIS — I2510 Atherosclerotic heart disease of native coronary artery without angina pectoris: Secondary | ICD-10-CM | POA: Diagnosis not present

## 2014-07-10 DIAGNOSIS — I252 Old myocardial infarction: Secondary | ICD-10-CM | POA: Diagnosis not present

## 2014-07-13 DIAGNOSIS — D0511 Intraductal carcinoma in situ of right breast: Secondary | ICD-10-CM | POA: Diagnosis not present

## 2014-07-13 DIAGNOSIS — J449 Chronic obstructive pulmonary disease, unspecified: Secondary | ICD-10-CM | POA: Diagnosis not present

## 2014-07-13 DIAGNOSIS — Z79899 Other long term (current) drug therapy: Secondary | ICD-10-CM | POA: Diagnosis not present

## 2014-07-13 DIAGNOSIS — M810 Age-related osteoporosis without current pathological fracture: Secondary | ICD-10-CM | POA: Diagnosis not present

## 2014-07-13 DIAGNOSIS — I2510 Atherosclerotic heart disease of native coronary artery without angina pectoris: Secondary | ICD-10-CM | POA: Diagnosis not present

## 2014-07-13 DIAGNOSIS — I252 Old myocardial infarction: Secondary | ICD-10-CM | POA: Diagnosis not present

## 2014-07-13 DIAGNOSIS — Z510 Encounter for antineoplastic radiation therapy: Secondary | ICD-10-CM | POA: Diagnosis not present

## 2014-07-13 DIAGNOSIS — Z8551 Personal history of malignant neoplasm of bladder: Secondary | ICD-10-CM | POA: Diagnosis not present

## 2014-07-14 DIAGNOSIS — Z510 Encounter for antineoplastic radiation therapy: Secondary | ICD-10-CM | POA: Diagnosis not present

## 2014-07-14 DIAGNOSIS — Z79899 Other long term (current) drug therapy: Secondary | ICD-10-CM | POA: Diagnosis not present

## 2014-07-14 DIAGNOSIS — I252 Old myocardial infarction: Secondary | ICD-10-CM | POA: Diagnosis not present

## 2014-07-14 DIAGNOSIS — D0511 Intraductal carcinoma in situ of right breast: Secondary | ICD-10-CM | POA: Diagnosis not present

## 2014-07-14 DIAGNOSIS — I2510 Atherosclerotic heart disease of native coronary artery without angina pectoris: Secondary | ICD-10-CM | POA: Diagnosis not present

## 2014-07-14 DIAGNOSIS — J449 Chronic obstructive pulmonary disease, unspecified: Secondary | ICD-10-CM | POA: Diagnosis not present

## 2014-07-14 DIAGNOSIS — M810 Age-related osteoporosis without current pathological fracture: Secondary | ICD-10-CM | POA: Diagnosis not present

## 2014-07-14 DIAGNOSIS — Z8551 Personal history of malignant neoplasm of bladder: Secondary | ICD-10-CM | POA: Diagnosis not present

## 2014-07-14 LAB — CBC CANCER CENTER
Basophil #: 0.1 x10 3/mm (ref 0.0–0.1)
Basophil %: 0.6 %
Eosinophil #: 0.2 x10 3/mm (ref 0.0–0.7)
Eosinophil %: 2 %
HCT: 37.9 % (ref 35.0–47.0)
HGB: 12.6 g/dL (ref 12.0–16.0)
Lymphocyte #: 1.9 x10 3/mm (ref 1.0–3.6)
Lymphocyte %: 20.4 %
MCH: 29.6 pg (ref 26.0–34.0)
MCHC: 33.2 g/dL (ref 32.0–36.0)
MCV: 89 fL (ref 80–100)
Monocyte #: 0.8 x10 3/mm (ref 0.2–0.9)
Monocyte %: 8.6 %
NEUTROS PCT: 68.4 %
Neutrophil #: 6.5 x10 3/mm (ref 1.4–6.5)
Platelet: 211 x10 3/mm (ref 150–440)
RBC: 4.25 10*6/uL (ref 3.80–5.20)
RDW: 15.3 % — ABNORMAL HIGH (ref 11.5–14.5)
WBC: 9.5 x10 3/mm (ref 3.6–11.0)

## 2014-07-15 DIAGNOSIS — D0511 Intraductal carcinoma in situ of right breast: Secondary | ICD-10-CM | POA: Diagnosis not present

## 2014-07-15 DIAGNOSIS — M810 Age-related osteoporosis without current pathological fracture: Secondary | ICD-10-CM | POA: Diagnosis not present

## 2014-07-15 DIAGNOSIS — Z510 Encounter for antineoplastic radiation therapy: Secondary | ICD-10-CM | POA: Diagnosis not present

## 2014-07-15 DIAGNOSIS — Z79899 Other long term (current) drug therapy: Secondary | ICD-10-CM | POA: Diagnosis not present

## 2014-07-15 DIAGNOSIS — Z8551 Personal history of malignant neoplasm of bladder: Secondary | ICD-10-CM | POA: Diagnosis not present

## 2014-07-15 DIAGNOSIS — I252 Old myocardial infarction: Secondary | ICD-10-CM | POA: Diagnosis not present

## 2014-07-15 DIAGNOSIS — J449 Chronic obstructive pulmonary disease, unspecified: Secondary | ICD-10-CM | POA: Diagnosis not present

## 2014-07-15 DIAGNOSIS — I2510 Atherosclerotic heart disease of native coronary artery without angina pectoris: Secondary | ICD-10-CM | POA: Diagnosis not present

## 2014-07-16 DIAGNOSIS — D0511 Intraductal carcinoma in situ of right breast: Secondary | ICD-10-CM | POA: Diagnosis not present

## 2014-07-16 DIAGNOSIS — I2510 Atherosclerotic heart disease of native coronary artery without angina pectoris: Secondary | ICD-10-CM | POA: Diagnosis not present

## 2014-07-16 DIAGNOSIS — Z79899 Other long term (current) drug therapy: Secondary | ICD-10-CM | POA: Diagnosis not present

## 2014-07-16 DIAGNOSIS — I252 Old myocardial infarction: Secondary | ICD-10-CM | POA: Diagnosis not present

## 2014-07-16 DIAGNOSIS — M810 Age-related osteoporosis without current pathological fracture: Secondary | ICD-10-CM | POA: Diagnosis not present

## 2014-07-16 DIAGNOSIS — Z510 Encounter for antineoplastic radiation therapy: Secondary | ICD-10-CM | POA: Diagnosis not present

## 2014-07-16 DIAGNOSIS — J449 Chronic obstructive pulmonary disease, unspecified: Secondary | ICD-10-CM | POA: Diagnosis not present

## 2014-07-16 DIAGNOSIS — Z8551 Personal history of malignant neoplasm of bladder: Secondary | ICD-10-CM | POA: Diagnosis not present

## 2014-07-17 ENCOUNTER — Ambulatory Visit
Admit: 2014-07-17 | Disposition: A | Discharge status: Home / Self Care | Payer: Self-pay | Attending: Oncology | Admitting: Oncology

## 2014-07-17 DIAGNOSIS — I252 Old myocardial infarction: Secondary | ICD-10-CM | POA: Diagnosis not present

## 2014-07-17 DIAGNOSIS — D0511 Intraductal carcinoma in situ of right breast: Secondary | ICD-10-CM | POA: Diagnosis not present

## 2014-07-17 DIAGNOSIS — M810 Age-related osteoporosis without current pathological fracture: Secondary | ICD-10-CM | POA: Diagnosis not present

## 2014-07-17 DIAGNOSIS — I2510 Atherosclerotic heart disease of native coronary artery without angina pectoris: Secondary | ICD-10-CM | POA: Diagnosis not present

## 2014-07-17 DIAGNOSIS — Z8551 Personal history of malignant neoplasm of bladder: Secondary | ICD-10-CM | POA: Diagnosis not present

## 2014-07-17 DIAGNOSIS — Z510 Encounter for antineoplastic radiation therapy: Secondary | ICD-10-CM | POA: Diagnosis not present

## 2014-07-17 DIAGNOSIS — J449 Chronic obstructive pulmonary disease, unspecified: Secondary | ICD-10-CM | POA: Diagnosis not present

## 2014-07-17 DIAGNOSIS — Z170 Estrogen receptor positive status [ER+]: Secondary | ICD-10-CM | POA: Diagnosis not present

## 2014-07-17 DIAGNOSIS — Z79899 Other long term (current) drug therapy: Secondary | ICD-10-CM | POA: Diagnosis not present

## 2014-07-20 DIAGNOSIS — M810 Age-related osteoporosis without current pathological fracture: Secondary | ICD-10-CM | POA: Diagnosis not present

## 2014-07-20 DIAGNOSIS — J449 Chronic obstructive pulmonary disease, unspecified: Secondary | ICD-10-CM | POA: Diagnosis not present

## 2014-07-20 DIAGNOSIS — Z510 Encounter for antineoplastic radiation therapy: Secondary | ICD-10-CM | POA: Diagnosis not present

## 2014-07-20 DIAGNOSIS — Z8551 Personal history of malignant neoplasm of bladder: Secondary | ICD-10-CM | POA: Diagnosis not present

## 2014-07-20 DIAGNOSIS — I2510 Atherosclerotic heart disease of native coronary artery without angina pectoris: Secondary | ICD-10-CM | POA: Diagnosis not present

## 2014-07-20 DIAGNOSIS — D0511 Intraductal carcinoma in situ of right breast: Secondary | ICD-10-CM | POA: Diagnosis not present

## 2014-07-20 DIAGNOSIS — Z79899 Other long term (current) drug therapy: Secondary | ICD-10-CM | POA: Diagnosis not present

## 2014-07-20 DIAGNOSIS — Z170 Estrogen receptor positive status [ER+]: Secondary | ICD-10-CM | POA: Diagnosis not present

## 2014-07-20 DIAGNOSIS — I252 Old myocardial infarction: Secondary | ICD-10-CM | POA: Diagnosis not present

## 2014-07-21 DIAGNOSIS — Z79899 Other long term (current) drug therapy: Secondary | ICD-10-CM | POA: Diagnosis not present

## 2014-07-21 DIAGNOSIS — Z8551 Personal history of malignant neoplasm of bladder: Secondary | ICD-10-CM | POA: Diagnosis not present

## 2014-07-21 DIAGNOSIS — M810 Age-related osteoporosis without current pathological fracture: Secondary | ICD-10-CM | POA: Diagnosis not present

## 2014-07-21 DIAGNOSIS — I252 Old myocardial infarction: Secondary | ICD-10-CM | POA: Diagnosis not present

## 2014-07-21 DIAGNOSIS — Z510 Encounter for antineoplastic radiation therapy: Secondary | ICD-10-CM | POA: Diagnosis not present

## 2014-07-21 DIAGNOSIS — Z170 Estrogen receptor positive status [ER+]: Secondary | ICD-10-CM | POA: Diagnosis not present

## 2014-07-21 DIAGNOSIS — J449 Chronic obstructive pulmonary disease, unspecified: Secondary | ICD-10-CM | POA: Diagnosis not present

## 2014-07-21 DIAGNOSIS — D0511 Intraductal carcinoma in situ of right breast: Secondary | ICD-10-CM | POA: Diagnosis not present

## 2014-07-21 DIAGNOSIS — I2510 Atherosclerotic heart disease of native coronary artery without angina pectoris: Secondary | ICD-10-CM | POA: Diagnosis not present

## 2014-07-21 LAB — CBC CANCER CENTER
Basophil #: 0 x10 3/mm (ref 0.0–0.1)
Basophil %: 0.5 %
EOS ABS: 0.1 x10 3/mm (ref 0.0–0.7)
Eosinophil %: 1.6 %
HCT: 37.8 % (ref 35.0–47.0)
HGB: 12.6 g/dL (ref 12.0–16.0)
Lymphocyte #: 1.3 x10 3/mm (ref 1.0–3.6)
Lymphocyte %: 16 %
MCH: 29.3 pg (ref 26.0–34.0)
MCHC: 33.2 g/dL (ref 32.0–36.0)
MCV: 88 fL (ref 80–100)
Monocyte #: 0.8 x10 3/mm (ref 0.2–0.9)
Monocyte %: 9.6 %
Neutrophil #: 6 x10 3/mm (ref 1.4–6.5)
Neutrophil %: 72.3 %
Platelet: 218 x10 3/mm (ref 150–440)
RBC: 4.28 10*6/uL (ref 3.80–5.20)
RDW: 14.8 % — ABNORMAL HIGH (ref 11.5–14.5)
WBC: 8.3 x10 3/mm (ref 3.6–11.0)

## 2014-07-22 DIAGNOSIS — I252 Old myocardial infarction: Secondary | ICD-10-CM | POA: Diagnosis not present

## 2014-07-22 DIAGNOSIS — I2510 Atherosclerotic heart disease of native coronary artery without angina pectoris: Secondary | ICD-10-CM | POA: Diagnosis not present

## 2014-07-22 DIAGNOSIS — M810 Age-related osteoporosis without current pathological fracture: Secondary | ICD-10-CM | POA: Diagnosis not present

## 2014-07-22 DIAGNOSIS — D0511 Intraductal carcinoma in situ of right breast: Secondary | ICD-10-CM | POA: Diagnosis not present

## 2014-07-22 DIAGNOSIS — Z8551 Personal history of malignant neoplasm of bladder: Secondary | ICD-10-CM | POA: Diagnosis not present

## 2014-07-22 DIAGNOSIS — Z510 Encounter for antineoplastic radiation therapy: Secondary | ICD-10-CM | POA: Diagnosis not present

## 2014-07-22 DIAGNOSIS — J449 Chronic obstructive pulmonary disease, unspecified: Secondary | ICD-10-CM | POA: Diagnosis not present

## 2014-07-22 DIAGNOSIS — Z170 Estrogen receptor positive status [ER+]: Secondary | ICD-10-CM | POA: Diagnosis not present

## 2014-07-22 DIAGNOSIS — Z79899 Other long term (current) drug therapy: Secondary | ICD-10-CM | POA: Diagnosis not present

## 2014-07-23 DIAGNOSIS — M810 Age-related osteoporosis without current pathological fracture: Secondary | ICD-10-CM | POA: Diagnosis not present

## 2014-07-23 DIAGNOSIS — Z170 Estrogen receptor positive status [ER+]: Secondary | ICD-10-CM | POA: Diagnosis not present

## 2014-07-23 DIAGNOSIS — Z8551 Personal history of malignant neoplasm of bladder: Secondary | ICD-10-CM | POA: Diagnosis not present

## 2014-07-23 DIAGNOSIS — D0511 Intraductal carcinoma in situ of right breast: Secondary | ICD-10-CM | POA: Diagnosis not present

## 2014-07-23 DIAGNOSIS — J449 Chronic obstructive pulmonary disease, unspecified: Secondary | ICD-10-CM | POA: Diagnosis not present

## 2014-07-23 DIAGNOSIS — I2510 Atherosclerotic heart disease of native coronary artery without angina pectoris: Secondary | ICD-10-CM | POA: Diagnosis not present

## 2014-07-23 DIAGNOSIS — Z79899 Other long term (current) drug therapy: Secondary | ICD-10-CM | POA: Diagnosis not present

## 2014-07-23 DIAGNOSIS — I252 Old myocardial infarction: Secondary | ICD-10-CM | POA: Diagnosis not present

## 2014-07-23 DIAGNOSIS — Z510 Encounter for antineoplastic radiation therapy: Secondary | ICD-10-CM | POA: Diagnosis not present

## 2014-07-24 DIAGNOSIS — J449 Chronic obstructive pulmonary disease, unspecified: Secondary | ICD-10-CM | POA: Diagnosis not present

## 2014-07-24 DIAGNOSIS — M810 Age-related osteoporosis without current pathological fracture: Secondary | ICD-10-CM | POA: Diagnosis not present

## 2014-07-24 DIAGNOSIS — I252 Old myocardial infarction: Secondary | ICD-10-CM | POA: Diagnosis not present

## 2014-07-24 DIAGNOSIS — Z79899 Other long term (current) drug therapy: Secondary | ICD-10-CM | POA: Diagnosis not present

## 2014-07-24 DIAGNOSIS — D0511 Intraductal carcinoma in situ of right breast: Secondary | ICD-10-CM | POA: Diagnosis not present

## 2014-07-24 DIAGNOSIS — I2510 Atherosclerotic heart disease of native coronary artery without angina pectoris: Secondary | ICD-10-CM | POA: Diagnosis not present

## 2014-07-24 DIAGNOSIS — Z8551 Personal history of malignant neoplasm of bladder: Secondary | ICD-10-CM | POA: Diagnosis not present

## 2014-07-24 DIAGNOSIS — Z170 Estrogen receptor positive status [ER+]: Secondary | ICD-10-CM | POA: Diagnosis not present

## 2014-07-24 DIAGNOSIS — Z510 Encounter for antineoplastic radiation therapy: Secondary | ICD-10-CM | POA: Diagnosis not present

## 2014-07-28 DIAGNOSIS — M810 Age-related osteoporosis without current pathological fracture: Secondary | ICD-10-CM | POA: Diagnosis not present

## 2014-07-28 DIAGNOSIS — Z170 Estrogen receptor positive status [ER+]: Secondary | ICD-10-CM | POA: Diagnosis not present

## 2014-07-28 DIAGNOSIS — I252 Old myocardial infarction: Secondary | ICD-10-CM | POA: Diagnosis not present

## 2014-07-28 DIAGNOSIS — Z8551 Personal history of malignant neoplasm of bladder: Secondary | ICD-10-CM | POA: Diagnosis not present

## 2014-07-28 DIAGNOSIS — J449 Chronic obstructive pulmonary disease, unspecified: Secondary | ICD-10-CM | POA: Diagnosis not present

## 2014-07-28 DIAGNOSIS — I2510 Atherosclerotic heart disease of native coronary artery without angina pectoris: Secondary | ICD-10-CM | POA: Diagnosis not present

## 2014-07-28 DIAGNOSIS — D0511 Intraductal carcinoma in situ of right breast: Secondary | ICD-10-CM | POA: Diagnosis not present

## 2014-07-28 DIAGNOSIS — Z79899 Other long term (current) drug therapy: Secondary | ICD-10-CM | POA: Diagnosis not present

## 2014-07-28 DIAGNOSIS — Z510 Encounter for antineoplastic radiation therapy: Secondary | ICD-10-CM | POA: Diagnosis not present

## 2014-07-28 LAB — CBC CANCER CENTER
BASOS ABS: 0.1 x10 3/mm (ref 0.0–0.1)
Basophil %: 0.8 %
Eosinophil #: 0.1 x10 3/mm (ref 0.0–0.7)
Eosinophil %: 1.7 %
HCT: 36.2 % (ref 35.0–47.0)
HGB: 12.1 g/dL (ref 12.0–16.0)
LYMPHS ABS: 1.4 x10 3/mm (ref 1.0–3.6)
Lymphocyte %: 16.2 %
MCH: 29.3 pg (ref 26.0–34.0)
MCHC: 33.4 g/dL (ref 32.0–36.0)
MCV: 88 fL (ref 80–100)
Monocyte #: 0.7 x10 3/mm (ref 0.2–0.9)
Monocyte %: 8.5 %
NEUTROS PCT: 72.8 %
Neutrophil #: 6.2 x10 3/mm (ref 1.4–6.5)
Platelet: 193 x10 3/mm (ref 150–440)
RBC: 4.12 10*6/uL (ref 3.80–5.20)
RDW: 14.7 % — AB (ref 11.5–14.5)
WBC: 8.6 x10 3/mm (ref 3.6–11.0)

## 2014-07-29 DIAGNOSIS — Z510 Encounter for antineoplastic radiation therapy: Secondary | ICD-10-CM | POA: Diagnosis not present

## 2014-07-29 DIAGNOSIS — I252 Old myocardial infarction: Secondary | ICD-10-CM | POA: Diagnosis not present

## 2014-07-29 DIAGNOSIS — I2510 Atherosclerotic heart disease of native coronary artery without angina pectoris: Secondary | ICD-10-CM | POA: Diagnosis not present

## 2014-07-29 DIAGNOSIS — D0511 Intraductal carcinoma in situ of right breast: Secondary | ICD-10-CM | POA: Diagnosis not present

## 2014-07-29 DIAGNOSIS — J449 Chronic obstructive pulmonary disease, unspecified: Secondary | ICD-10-CM | POA: Diagnosis not present

## 2014-07-29 DIAGNOSIS — M810 Age-related osteoporosis without current pathological fracture: Secondary | ICD-10-CM | POA: Diagnosis not present

## 2014-07-29 DIAGNOSIS — Z79899 Other long term (current) drug therapy: Secondary | ICD-10-CM | POA: Diagnosis not present

## 2014-07-29 DIAGNOSIS — Z170 Estrogen receptor positive status [ER+]: Secondary | ICD-10-CM | POA: Diagnosis not present

## 2014-07-29 DIAGNOSIS — Z8551 Personal history of malignant neoplasm of bladder: Secondary | ICD-10-CM | POA: Diagnosis not present

## 2014-07-30 DIAGNOSIS — Z8551 Personal history of malignant neoplasm of bladder: Secondary | ICD-10-CM | POA: Diagnosis not present

## 2014-07-30 DIAGNOSIS — Z510 Encounter for antineoplastic radiation therapy: Secondary | ICD-10-CM | POA: Diagnosis not present

## 2014-07-30 DIAGNOSIS — M810 Age-related osteoporosis without current pathological fracture: Secondary | ICD-10-CM | POA: Diagnosis not present

## 2014-07-30 DIAGNOSIS — I252 Old myocardial infarction: Secondary | ICD-10-CM | POA: Diagnosis not present

## 2014-07-30 DIAGNOSIS — D0511 Intraductal carcinoma in situ of right breast: Secondary | ICD-10-CM | POA: Diagnosis not present

## 2014-07-30 DIAGNOSIS — Z79899 Other long term (current) drug therapy: Secondary | ICD-10-CM | POA: Diagnosis not present

## 2014-07-30 DIAGNOSIS — I2510 Atherosclerotic heart disease of native coronary artery without angina pectoris: Secondary | ICD-10-CM | POA: Diagnosis not present

## 2014-07-30 DIAGNOSIS — J449 Chronic obstructive pulmonary disease, unspecified: Secondary | ICD-10-CM | POA: Diagnosis not present

## 2014-07-30 DIAGNOSIS — Z170 Estrogen receptor positive status [ER+]: Secondary | ICD-10-CM | POA: Diagnosis not present

## 2014-07-31 ENCOUNTER — Other Ambulatory Visit: Payer: Self-pay | Admitting: Oncology

## 2014-07-31 DIAGNOSIS — Z853 Personal history of malignant neoplasm of breast: Secondary | ICD-10-CM

## 2014-07-31 DIAGNOSIS — Z79891 Long term (current) use of opiate analgesic: Secondary | ICD-10-CM

## 2014-07-31 DIAGNOSIS — M810 Age-related osteoporosis without current pathological fracture: Secondary | ICD-10-CM

## 2014-08-03 DIAGNOSIS — Z170 Estrogen receptor positive status [ER+]: Secondary | ICD-10-CM | POA: Diagnosis not present

## 2014-08-03 DIAGNOSIS — I252 Old myocardial infarction: Secondary | ICD-10-CM | POA: Diagnosis not present

## 2014-08-03 DIAGNOSIS — Z8551 Personal history of malignant neoplasm of bladder: Secondary | ICD-10-CM | POA: Diagnosis not present

## 2014-08-03 DIAGNOSIS — M810 Age-related osteoporosis without current pathological fracture: Secondary | ICD-10-CM | POA: Diagnosis not present

## 2014-08-03 DIAGNOSIS — D0511 Intraductal carcinoma in situ of right breast: Secondary | ICD-10-CM | POA: Diagnosis not present

## 2014-08-03 DIAGNOSIS — I2510 Atherosclerotic heart disease of native coronary artery without angina pectoris: Secondary | ICD-10-CM | POA: Diagnosis not present

## 2014-08-03 DIAGNOSIS — Z79899 Other long term (current) drug therapy: Secondary | ICD-10-CM | POA: Diagnosis not present

## 2014-08-03 DIAGNOSIS — Z510 Encounter for antineoplastic radiation therapy: Secondary | ICD-10-CM | POA: Diagnosis not present

## 2014-08-03 DIAGNOSIS — J449 Chronic obstructive pulmonary disease, unspecified: Secondary | ICD-10-CM | POA: Diagnosis not present

## 2014-08-04 DIAGNOSIS — M810 Age-related osteoporosis without current pathological fracture: Secondary | ICD-10-CM | POA: Diagnosis not present

## 2014-08-04 DIAGNOSIS — D0511 Intraductal carcinoma in situ of right breast: Secondary | ICD-10-CM | POA: Diagnosis not present

## 2014-08-04 DIAGNOSIS — J449 Chronic obstructive pulmonary disease, unspecified: Secondary | ICD-10-CM | POA: Diagnosis not present

## 2014-08-04 DIAGNOSIS — I2510 Atherosclerotic heart disease of native coronary artery without angina pectoris: Secondary | ICD-10-CM | POA: Diagnosis not present

## 2014-08-04 DIAGNOSIS — Z170 Estrogen receptor positive status [ER+]: Secondary | ICD-10-CM | POA: Diagnosis not present

## 2014-08-04 DIAGNOSIS — Z510 Encounter for antineoplastic radiation therapy: Secondary | ICD-10-CM | POA: Diagnosis not present

## 2014-08-04 DIAGNOSIS — I252 Old myocardial infarction: Secondary | ICD-10-CM | POA: Diagnosis not present

## 2014-08-04 DIAGNOSIS — Z79899 Other long term (current) drug therapy: Secondary | ICD-10-CM | POA: Diagnosis not present

## 2014-08-04 DIAGNOSIS — Z8551 Personal history of malignant neoplasm of bladder: Secondary | ICD-10-CM | POA: Diagnosis not present

## 2014-08-05 DIAGNOSIS — Z79899 Other long term (current) drug therapy: Secondary | ICD-10-CM | POA: Diagnosis not present

## 2014-08-05 DIAGNOSIS — I2510 Atherosclerotic heart disease of native coronary artery without angina pectoris: Secondary | ICD-10-CM | POA: Diagnosis not present

## 2014-08-05 DIAGNOSIS — I252 Old myocardial infarction: Secondary | ICD-10-CM | POA: Diagnosis not present

## 2014-08-05 DIAGNOSIS — Z8551 Personal history of malignant neoplasm of bladder: Secondary | ICD-10-CM | POA: Diagnosis not present

## 2014-08-05 DIAGNOSIS — M810 Age-related osteoporosis without current pathological fracture: Secondary | ICD-10-CM | POA: Diagnosis not present

## 2014-08-05 DIAGNOSIS — D0511 Intraductal carcinoma in situ of right breast: Secondary | ICD-10-CM | POA: Diagnosis not present

## 2014-08-05 DIAGNOSIS — J449 Chronic obstructive pulmonary disease, unspecified: Secondary | ICD-10-CM | POA: Diagnosis not present

## 2014-08-05 DIAGNOSIS — Z510 Encounter for antineoplastic radiation therapy: Secondary | ICD-10-CM | POA: Diagnosis not present

## 2014-08-05 DIAGNOSIS — Z170 Estrogen receptor positive status [ER+]: Secondary | ICD-10-CM | POA: Diagnosis not present

## 2014-08-10 LAB — SURGICAL PATHOLOGY

## 2014-08-16 NOTE — Op Note (Signed)
PATIENT NAME:  Amy Stone, Amy Stone MR#:  [id number] DATE OF BIRTH:  [birth year]  DATE OF PROCEDURE:  06/02/2014  PREOPERATIVE DIAGNOSIS: Right breast cancer.   POSTOPERATIVE DIAGNOSIS: Right breast cancer.   PROCEDURE: Right partial mastectomy with axillary sentinel lymph node biopsy.   SURGEON: Rochel Brome, MD  ANESTHESIA: General.   INDICATIONS: This 74 year old female recently felt a small nodule in the lateral aspect of the right breast. Ultrasound demonstrated a 9 mm nodule 1 cm from the nipple. Mammogram demonstrated a density at this site. She had ultrasound-guided core biopsy demonstrating infiltrating mammary carcinoma. Surgery was recommended for definitive treatment. She did have preoperative injection of radioactive technetium sulfur colloid.   DESCRIPTION OF PROCEDURE: The patient was placed on the operating table in the supine position under general anesthesia. The right arm was placed on a lateral arm support. The right breast, axilla, and upper arm and chest wall were prepared with ChloraPrep and draped in a sterile manner.   The right partial mastectomy incision did include the nipple and a portion of the areola as the tumor was 1 cm from the nipple and the excision was centered over the tumor. The skin ellipse was oriented transversely. The lateral end of the skin ellipse was tagged with a 3-0 nylon suture. The incision was carried down through subcutaneous tissues using electrocautery for hemostasis. The dissection was carried down into the glandular tissue of the breast, and the mass with surrounding tissue was excised and submitted for pathology. The wound was inspected. Several small bleeding points were cauterized. One clamped vessel was suture ligated with 4-0 chromic.   Attention was turned to the axilla which was probed with a gamma counter demonstrating location of radioactivity. An oblique incision was made in the inferior aspect of the axilla approximately 4.5 cm in  length, carried down through subcutaneous tissues through superficial fascia deeply within the axilla. There was a somewhat tedious dissection undertaken. The radioactivity was high within the axilla and dissected close to the axillary vein. The intercostal brachial nerve was noted. The gamma counter was further used to locate the radioactivity. There were several small portions of fatty tissue which were sent in a separate container. The sentinel lymph node ex vivo count was greater than 50 counts per second. The background count was less than 12 counts per second. There was no remaining palpable mass within the axilla. It is noted that during the course of the procedure a number of small bleeding points were cauterized.   The pathologist did call back to report that the surgical margins for the partial mastectomy appeared good and at a later time the pathologist called back to say that initial study of the sentinel lymph node appeared negative for macrometastasis.   The partial mastectomy wound and the axillary wound were each infiltrated with 0.5% Sensorcaine in the subcutaneous tissues. Next, the subcutaneous tissues of each incision were closed with 4-0 chromic, and the skin incisions were closed with running 4-0 Monocryl subcuticular suture and LiquiBand. The patient tolerated surgery satisfactorily and was prepared for transfer to the recovery room.  ____________________________ Lenna Sciara. Rochel Brome, MD jws:sb D: 06/02/2014 13:22:40 ET T: 06/02/2014 13:57:47 ET JOB#: [id number]  cc: Loreli Dollar, MD, <Dictator> Loreli Dollar MD ELECTRONICALLY SIGNED 06/03/2014 18:01

## 2014-08-16 NOTE — Consult Note (Signed)
Reason for Visit: This 74 year old Female patient presents to the clinic for initial evaluation of  breast cancer .   Referred by Dr. Oliva Bustard.  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 She went on to have a mammograman ultrasound confirming a 9 mm mass in the 9:00 position of the right breast. Underwent core biopsy positive for invasive mammary carcinoma. She went have a wide local excision and sentinel node biopsy for a 0.9 cm invasive mammary carcinoma with 3 sentinel lymph nodes negative for metastatic disease. Tumor was ER/PR positive HER-2/neu not overexpressed. She has done well postoperatively and this without complaint. She does have a significant family history of breast cancer with 2 sisters having breast cancer below age 75 and BRCA mutation testing is pending. She is seen today for opinion regarding radiation. She specifically denies breast tenderness cough or bone pain at this time.  Past, Family and Social History:  Past Medical History positive   Cardiovascular coronary artery disease; myocardial infarction   Respiratory COPD   Genitourinary history bladder cancer   Past Medical History Comments osteoporosis   Family History positive   Family History Comments strong family history of breast cancer with 2 sisters below age 37. Also family history of hypertension obesity  thyroid disease   Social History positive   Social History Comments 40 pack year smoking history has quit smoking in 2007   Additional Past Medical and Surgical History seen accompanied by her husband today   Allergies:   Shellfish: N/V, Hives, Swelling  Fish: N/V, Hives  Contrast - Iodinated Radiocontrast Dye: Hives  Prednisone: Rash  Home Meds:  Home Medications: Medication Instructions Status  letrozole 2.5 mg oral tablet 1 tab(s) orally once a day *start after completed radiation* Active  calcium carbonate 1000 mg oral tablet, chewable 1 tab(s) orally once a day Active  Spiriva 18 mcg inhalation capsule 1 cap(s) inhaled once a day Active  benazepril 10 mg oral tablet 1 tab(s) orally once a day Active  aspirin 81 mg oral tablet 1 tab(s) orally once a day Active  levothyroxine 50 mcg (0.05 mg) oral tablet 1 tab(s) orally once a day Active  atenolol 25 mg oral tablet 1 tab(s) orally once a day Active  Lipitor 80 mg oral tablet 1 tab(s) orally once a day (at bedtime) Active  Vitamin D3 1 cap(s) orally once a week Thurs Active  Bladder 2.2 Multiple Vitamins with Minerals oral tablet 1 tab(s) orally every 48 hours Active  Norco 325 mg-5 mg oral tablet 1 tab(s) orally every 4 hours as needed for pain Active   Review of Systems:  General negative   Performance Status (ECOG) 0   Skin negative   Breast see HPI   Ophthalmologic negative   ENMT negative   Respiratory and Thorax negative   Cardiovascular negative   Gastrointestinal negative   Genitourinary negative   Musculoskeletal negative   Neurological negative   Psychiatric  negative   Hematology/Lymphatics negative   Endocrine negative   Allergic/Immunologic negative   Review of Systems   denies any weight loss, fatigue, weakness, fever, chills or night sweats. Patient denies any loss of vision, blurred vision. Patient denies any ringing  of the ears or hearing loss. No irregular heartbeat. Patient denies  heart murmur or history of fainting. Patient denies any chest pain or pain radiating to her upper extremities. Patient denies any shortness of breath, difficulty breathing at night, cough or hemoptysis. Patient denies any swelling in the lower legs. Patient denies any nausea vomiting, vomiting of blood, or coffee ground material in the vomitus. Patient denies any stomach pain. Patient states has had normal bowel movements no significant constipation or diarrhea. Patient denies any dysuria, hematuria or significant nocturia. Patient denies any problems walking, swelling in the joints or loss of balance. Patient denies any skin changes, loss of hair or loss of weight. Patient denies any excessive worrying or anxiety or significant depression. Patient denies any problems with insomnia. Patient denies excessive thirst, polyuria, polydipsia. Patient denies any swollen glands, patient denies easy bruising or easy bleeding. Patient denies any recent infections, allergies or URI. Patient "s visual fields have not changed significantly in recent time.   Nursing Notes:  Nursing Vital Signs and Chemo Nursing Nursing Notes: *CC Vital Signs Flowsheet:   03-Mar-16 13:24  Temp Temperature 96.6  Pulse Pulse 49  Respirations Respirations 18  SBP SBP 159  DBP DBP 62  Pain Scale (0-10)  0  Current Weight (kg) (kg) 59.1  Height (cm) centimeters 160  BSA (m2) 1.6   Physical Exam:  General/Skin/HEENT:  General normal   Skin normal   Eyes normal   ENMT normal   Head and Neck normal   Additional PE well-developed female in NAD. She has a wide local excision site at the 9:00 position of the right breast which is well-healed. she has had sacrifice of the nipple areola complex in the right although the cosmetic result is still good.No dominant mass or nodularity is noted in either breast in 2 positions examined. Lungs are clear to A&P cardiac examination shows regular rate and rhythm. No axillary or  supraclavicular adenopathy is identified. Abdomen is benign.   Breasts/Resp/CV/GI/GU:  Respiratory and Thorax normal   Cardiovascular normal   Gastrointestinal normal   Genitourinary normal   MS/Neuro/Psych/Lymph:  Musculoskeletal normal   Neurological normal   Lymphatics normal   Other Results:  Radiology Results: LabUnknown:    06-Jan-15 11:22, Screening Digital Mammogram  PACS Image    Relevent Results:   Relevant Scans and Labs mammograms are reviewed   Assessment and Plan: Impression:   stage IA invasive mammary carcinoma of the right breast status post wide local excision sentinel node biopsy in 74 year old female tumor is ER/PR positive HER-2/neu not overexpressed. Plan:   at this time I have recommended adjuvant radiation therapy to her right breast. Would plan on delivering 4200 cGy over 4 weeks boosting her scar another 1400 cGy using electron beam treatment.patient her husband are traveling out of the country at the end of April so I be to use the hypofractionated treatment regimen even though her breast is somewhat large. Should she develop significant skin changes will decrease her fractionation dose accordingly. Risks and benefits of treatment including skin reaction, fatigue, alteration of blood counts, inclusion of some superficial lung, all were explained in detail to the patient. She seems to comprehend my treatment plan well. I have set up and  ordered CT simulation early next week. Patient will also be a candidate for aromatase inhibitor therapy after completion of radiation.  I would like to take this opportunity for allowing me to participate in the care of your patient..  Fax to Physician:  Physicians To Recieve Fax: Rochel Brome, MD - [phone].  Electronic Signatures: Armstead Peaks (MD)  (Signed 03-Mar-16 15:24)  Authored: HPI, Diagnosis, PFSH, Allergies, Home Meds, ROS, Nursing Notes, Physical Exam, Other Results, Relevent Results, Encounter  Assessment and Plan, Fax to Physician   Last Updated: 03-Mar-16 15:24 by Armstead Peaks (MD)

## 2014-09-09 DIAGNOSIS — Z853 Personal history of malignant neoplasm of breast: Secondary | ICD-10-CM | POA: Diagnosis not present

## 2014-09-24 ENCOUNTER — Ambulatory Visit
Admission: RE | Admit: 2014-09-24 | Discharge: 2014-09-24 | Disposition: A | Discharge status: Home / Self Care | Payer: Commercial Managed Care - HMO | Source: Ambulatory Visit | Attending: Radiation Oncology | Admitting: Radiation Oncology

## 2014-09-24 ENCOUNTER — Encounter: Payer: Self-pay | Admitting: Radiation Oncology

## 2014-09-24 VITALS — BP 116/77 | HR 68 | Resp 20 | Wt 126.5 lb

## 2014-09-24 DIAGNOSIS — D0511 Intraductal carcinoma in situ of right breast: Secondary | ICD-10-CM | POA: Diagnosis not present

## 2014-09-24 DIAGNOSIS — C50911 Malignant neoplasm of unspecified site of right female breast: Secondary | ICD-10-CM | POA: Diagnosis not present

## 2014-09-24 NOTE — Progress Notes (Signed)
Radiation Oncology Follow up Note  Name: Amy Stone   Date:   09/24/2014 MRN:  [id number] DOB: [birth year]    This 74 y.o. female presents to the clinic today for follow-up breast cancer.  REFERRING PROVIDER: Jinny Sanders, MD  HPI: Patient is a 74 year old female now 1 month out having completed whole breast radiation 3 stage IA (T1 BN 0 M0) invasive mammary carcinoma ER/PR positive HER-2/neu not overexpressed. She is seen today in routine follow-up and is doing extremely well. She's currently on aromatase inhibitor tolerating that without side effect. She specifically denies breast tenderness cough or bone pain..  COMPLICATIONS OF TREATMENT: none  FOLLOW UP COMPLIANCE: keeps appointments   PHYSICAL EXAM:  BP 116/77 mmHg  Pulse 68  Resp 20  Wt 126 lb 8.7 oz (57.4 kg) Well-developed female in NAD. She has sacrifice of the right nipple areolar complex. No dominant mass or nodularity is noted in either breast in 2 positions examined. No axillary or supraclavicular adenopathy is appreciated. Well-developed well-nourished patient in NAD. HEENT reveals PERLA, EOMI, discs not visualized.  Oral cavity is clear. No oral mucosal lesions are identified. Neck is clear without evidence of cervical or supraclavicular adenopathy. Lungs are clear to A&P. Cardiac examination is essentially unremarkable with regular rate and rhythm without murmur rub or thrill. Abdomen is benign with no organomegaly or masses noted. Motor sensory and DTR levels are equal and symmetric in the upper and lower extremities. Cranial nerves II through XII are grossly intact. Proprioception is intact. No peripheral adenopathy or edema is identified. No motor or sensory levels are noted. Crude visual fields are within normal range.   RADIOLOGY RESULTS: No recent mammograms are available for review  PLAN: Present time she continues to do well 1 month out of external beam radiation. I am please were overall progress. She  continues on aromatase inhibitor without side effect. I've asked to see her back in 6 months for follow-up and then will go to once your follow-up visits. Patient is to call sooner with any concerns. Patient has been given her survivorship information.  I would like to take this opportunity for allowing me to participate in the care of your patient.Armstead Peaks., MD

## 2014-09-24 NOTE — Progress Notes (Signed)
Survivorship visit- Survivorship visit completed. Survivorship Care plan given and reviewed with patients. [facility] answers to Survivorship Care booklet given and reviewed with patient. Resources given about CARE program and Cancer Transitions. Patient verbalized understanding.

## 2014-10-08 ENCOUNTER — Ambulatory Visit
Admission: RE | Admit: 2014-10-08 | Discharge: 2014-10-08 | Disposition: A | Discharge status: Home / Self Care | Payer: Commercial Managed Care - HMO | Source: Ambulatory Visit | Attending: Oncology | Admitting: Oncology

## 2014-10-08 DIAGNOSIS — Z79891 Long term (current) use of opiate analgesic: Secondary | ICD-10-CM

## 2014-10-08 DIAGNOSIS — M810 Age-related osteoporosis without current pathological fracture: Secondary | ICD-10-CM | POA: Diagnosis not present

## 2014-10-08 DIAGNOSIS — Z853 Personal history of malignant neoplasm of breast: Secondary | ICD-10-CM | POA: Insufficient documentation

## 2014-10-08 DIAGNOSIS — Z780 Asymptomatic menopausal state: Secondary | ICD-10-CM | POA: Diagnosis not present

## 2014-10-09 ENCOUNTER — Other Ambulatory Visit: Payer: Self-pay | Admitting: *Deleted

## 2014-10-09 DIAGNOSIS — C50919 Malignant neoplasm of unspecified site of unspecified female breast: Secondary | ICD-10-CM

## 2014-10-13 ENCOUNTER — Inpatient Hospital Stay: Payer: Commercial Managed Care - HMO

## 2014-10-13 ENCOUNTER — Inpatient Hospital Stay: Payer: Commercial Managed Care - HMO | Attending: Oncology | Admitting: Oncology

## 2014-10-13 VITALS — BP 118/69 | HR 63 | Temp 96.5°F | Wt 127.9 lb

## 2014-10-13 DIAGNOSIS — D0511 Intraductal carcinoma in situ of right breast: Secondary | ICD-10-CM | POA: Diagnosis not present

## 2014-10-13 DIAGNOSIS — Z79899 Other long term (current) drug therapy: Secondary | ICD-10-CM | POA: Diagnosis not present

## 2014-10-13 DIAGNOSIS — C50919 Malignant neoplasm of unspecified site of unspecified female breast: Secondary | ICD-10-CM

## 2014-10-13 DIAGNOSIS — M810 Age-related osteoporosis without current pathological fracture: Secondary | ICD-10-CM | POA: Insufficient documentation

## 2014-10-13 DIAGNOSIS — D0592 Unspecified type of carcinoma in situ of left breast: Secondary | ICD-10-CM

## 2014-10-13 DIAGNOSIS — Z923 Personal history of irradiation: Secondary | ICD-10-CM | POA: Insufficient documentation

## 2014-10-13 DIAGNOSIS — Z170 Estrogen receptor positive status [ER+]: Secondary | ICD-10-CM | POA: Insufficient documentation

## 2014-10-13 LAB — CBC WITH DIFFERENTIAL/PLATELET
BASOS ABS: 0.1 10*3/uL (ref 0–0.1)
Basophils Relative: 1 %
Eosinophils Absolute: 0.2 10*3/uL (ref 0–0.7)
Eosinophils Relative: 2 %
HCT: 37 % (ref 35.0–47.0)
Hemoglobin: 12.2 g/dL (ref 12.0–16.0)
LYMPHS ABS: 1.6 10*3/uL (ref 1.0–3.6)
LYMPHS PCT: 17 %
MCH: 29.3 pg (ref 26.0–34.0)
MCHC: 33 g/dL (ref 32.0–36.0)
MCV: 88.8 fL (ref 80.0–100.0)
Monocytes Absolute: 0.8 10*3/uL (ref 0.2–0.9)
Monocytes Relative: 8 %
NEUTROS ABS: 6.6 10*3/uL — AB (ref 1.4–6.5)
Neutrophils Relative %: 72 %
Platelets: 206 10*3/uL (ref 150–440)
RBC: 4.17 MIL/uL (ref 3.80–5.20)
RDW: 14.7 % — AB (ref 11.5–14.5)
WBC: 9.2 10*3/uL (ref 3.6–11.0)

## 2014-10-13 LAB — COMPREHENSIVE METABOLIC PANEL
ALT: 16 U/L (ref 14–54)
AST: 23 U/L (ref 15–41)
Albumin: 4 g/dL (ref 3.5–5.0)
Alkaline Phosphatase: 82 U/L (ref 38–126)
Anion gap: 5 (ref 5–15)
BUN: 15 mg/dL (ref 6–20)
CHLORIDE: 104 mmol/L (ref 101–111)
CO2: 26 mmol/L (ref 22–32)
Calcium: 8.3 mg/dL — ABNORMAL LOW (ref 8.9–10.3)
Creatinine, Ser: 0.86 mg/dL (ref 0.44–1.00)
GFR calc Af Amer: >60 mL/min (ref >60)
GLUCOSE: 96 mg/dL (ref 65–99)
Potassium: 4.5 mmol/L (ref 3.5–5.1)
SODIUM: 135 mmol/L (ref 135–145)
Total Bilirubin: 0.7 mg/dL (ref 0.3–1.2)
Total Protein: 7.2 g/dL (ref 6.5–8.1)

## 2014-10-13 MED ORDER — LETROZOLE 2.5 MG PO TABS: 2.5000 mg | ORAL_TABLET | Freq: Every day | ORAL | Status: DC

## 2014-10-13 NOTE — Progress Notes (Signed)
Patient does not have living will.  Former smoker. 

## 2014-10-16 ENCOUNTER — Encounter: Payer: Self-pay | Admitting: Oncology

## 2014-10-16 NOTE — Progress Notes (Signed)
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  Diagnosis in now February of 2016 status post lumpectomy and sentinel lymph node evaluation (June 02, 2014) Total size of tumor is 9 mm.  Sentinel lymph nodes were negative.  T1b N0 M0 tumor estrogen receptor and progesterone receptor positive.  HER-2/neu receptor negative. Patient has finished radiation therapy and here to discuss further options of treatment with anti-hormonal treatment    Oncology Flowsheet 07/28/2011 07/29/2011 07/30/2011 07/30/2011 08/01/2011 08/02/2011 08/03/2011  ondansetron (ZOFRAN) IV 4 mg 4 mg 4 mg 4 mg 4 mg 4 mg 4 mg    INTERVAL HISTORY: 74 year old lady with a history of carcinoma breast stage IB with ductal carcinoma in situ and invasive ductal carcinoma status post lumpectomy and radiation therapy.  Estrogen receptor progesterone receptor positive.  HER-2/neu receptor negative.  Patient has finished radiation therapy REVIEW OF SYSTEMS:   GENERAL:  Feels good.  Active.  No fevers, sweats or weight loss. PERFORMANCE STATUS (ECOG):  01 HEENT:  No visual changes, runny nose, sore throat, mouth sores or tenderness. Lungs: No shortness of breath or cough.  No hemoptysis. Cardiac:  No chest pain, palpitations, orthopnea, or PND. GI:  No nausea, vomiting, diarrhea, constipation, melena or hematochezia. GU:  No urgency, frequency, dysuria, or hematuria. Musculoskeletal:  No back pain.  No joint pain.  No muscle tenderness. Extremities:  No pain or swelling. Skin:  No rashes or skin changes. Neuro:  No headache, numbness or  weakness, balance or coordination issues. Endocrine:  No diabetes, thyroid issues, hot flashes or night sweats. Psych:  No mood changes, depression or anxiety. Pain:  No focal pain. Review of systems:  All other systems reviewed and found to be negative. As per HPI. Otherwise, a complete review of systems is negatve.  PAST MEDICAL HISTORY: Past Medical History  Diagnosis Date  . Epigastric pain   . Osteoporosis, unspecified   . Cervicalgia   . Unspecified tinnitus   . Neoplasm of uncertain behavior of skin   . Thoracic aneurysm, ruptured   . Other chest pain   . Breast screening, unspecified   . Unspecified essential hypertension   . Other and unspecified hyperlipidemia   . Unspecified hypothyroidism   . Osteoarthrosis, unspecified whether generalized or localized, unspecified site   . Chronic airway obstruction, not elsewhere classified   . Peripheral vascular disease   . Myocardial infarction 2007  . Coronary atherosclerosis of unspecified type of vessel, native or graft 2007  . PONV (postoperative nausea and vomiting)   . Shortness of breath   . Blood transfusion   . UTI (lower urinary tract infection)   . Cancer     Bladder  . Thyroid nodule     seen on CT Scan 07/19/2011  . AAA (abdominal aortic aneurysm)   . 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 Acute inferior mi  11/2005  . Port wine stain removal w/ laser  2004    bladder cancer  . Goiter removal  1979  . Coronary artery bypass graft  11/2005  . Aorta - bilateral femoral artery bypass graft  07/25/2011    Procedure: AORTA BIFEMORAL BYPASS GRAFT;  Surgeon: Angelia Mould, MD;  Location: [facility];  Service: Vascular;  Laterality: Bilateral;  . Abdominal hysterectomy    . Pr vein bypass graft,aorto-fem-pop  July 25, 2011  . Abdominal aortagram N/A 05/15/2011    Procedure: ABDOMINAL Maxcine Ham;  Surgeon: Angelia Mould, MD;   Location: [facility] CATH LAB;  Service: Cardiovascular;  Laterality: N/A;    FAMILY HISTORY Family History  Problem Relation Age of Onset  . Alzheimer's disease Father   . Parkinsonism Father   . Heart defect Mother     enlarged heart  . Heart attack Mother   . Hypertension Sister   . Diabetes Sister   . Heart disease Sister   . Hyperlipidemia Sister   . Hypothyroidism Sister   . Aneurysm Paternal Grandfather     ADVANCED DIRECTIVES She does not have a living will.  Record indicates that patient underwent counseling regarding that.  HEALTH MAINTENANCE: History  Substance Use Topics  . Smoking status: Former Smoker -- 1 years    Types: Cigarettes    Quit date: 11/24/2005  . Smokeless tobacco: Never Used     Comment: 40 pack years   . Alcohol Use: No      Allergies  Allergen Reactions  . Iohexol      Code: HIVES, Desc: CONTRAST REACTION OF HIVES (LARGE WHELPS DEVELOPED OVER ENTIRE BODY/MMS   . Prednisone Other (See Comments)    hallucinations    Current Outpatient Prescriptions  Medication Sig Dispense Refill  . aspirin EC 81 MG tablet Take 81 mg by mouth daily.    [name] atenolol (TENORMIN) 25 MG tablet TAKE ONE TABLET BY MOUTH ONCE DAILY 90 tablet 3  . atorvastatin (LIPITOR) 80 MG tablet Take 1 tablet (80 mg total) by mouth daily. 90 tablet 3  . benazepril (LOTENSIN) 10 MG tablet Take by mouth.    . Cholecalciferol (VITAMIN D3) 50000 UNITS CAPS 1 tb po weekly x 12 weeks 12 capsule 0  . letrozole (FEMARA) 2.5 MG tablet Take 1 tablet (2.5 mg total) by mouth daily. 30 tablet 6  . levothyroxine (SYNTHROID, LEVOTHROID) 50 MCG tablet TAKE ONE-HALF TABLET BY MOUTH ONCE DAILY 45 tablet 3  . losartan (COZAAR) 50 MG tablet Take 1 tablet (50 mg total) by mouth daily. 30 tablet 11  . NON FORMULARY     . tiotropium (SPIRIVA HANDIHALER) 18 MCG inhalation capsule INHALE ONE DOSE BY MOUTH ONCE DAILY 90 capsule 3   No current facility-administered medications for this visit.     OBJECTIVE:  Filed Vitals:   10/13/14 1159  BP: 118/69  Pulse: 63  Temp: 96.5 F (35.8 C)     Body mass index is 20.95 kg/(m^2).    ECOG FS:1 - Symptomatic but completely ambulatory  PHYSICAL EXAM: GENERAL:  Well developed, well nourished, sitting comfortably in the exam room in no acute distress. MENTAL STATUS:  Alert and oriented to person, place and time. HEAD:Normocephalic, atraumatic, face symmetric, no Cushingoid features. EYES:    Pupils equal round and reactive to light and accomodation.  No conjunctivitis or scleral icterus. ENT:  Oropharynx clear without lesion.  Tongue normal. Mucous membranes moist.  RESPIRATORY:  Clear to auscultation without rales, wheezes or rhonchi. CARDIOVASCULAR:  Regular rate and rhythm without murmur, rub or gallop. BREAST:  Right breast without masses, skin changes or nipple discharge.  Left breast without masses, skin changes  Due to radiationor nipple discharge. ABDOMEN:  Soft, non-tender, with active bowel sounds, and no hepatosplenomegaly.  No masses. BACK:  No CVA tenderness.  No tenderness on percussion of the back or rib cage. SKIN:  No rashes, ulcers or lesions. EXTREMITIES: No edema, no skin discoloration or tenderness.  No palpable cords. LYMPH NODES: No palpable cervical, supraclavicular, axillary or inguinal adenopathy  NEUROLOGICAL: Unremarkable. PSYCH:  Appropriate.   LAB RESULTS:  Appointment on 10/13/2014  Component Date Value Ref Range Status  . WBC 10/13/2014 9.2  3.6 - 11.0 K/uL Final  . RBC 10/13/2014 4.17  3.80 - 5.20 MIL/uL Final  . Hemoglobin 10/13/2014 12.2  12.0 - 16.0 g/dL Final  . HCT 10/13/2014 37.0  35.0 - 47.0 % Final  . MCV 10/13/2014 88.8  80.0 - 100.0 fL Final  . MCH 10/13/2014 29.3  26.0 - 34.0 pg Final  . MCHC 10/13/2014 33.0  32.0 - 36.0 g/dL Final  . RDW 10/13/2014 14.7* 11.5 - 14.5 % Final  . Platelets 10/13/2014 206  150 - 440 K/uL Final  . Neutrophils Relative % 10/13/2014 72   Final  .  Neutro Abs 10/13/2014 6.6* 1.4 - 6.5 K/uL Final  . Lymphocytes Relative 10/13/2014 17   Final  . Lymphs Abs 10/13/2014 1.6  1.0 - 3.6 K/uL Final  . Monocytes Relative 10/13/2014 8   Final  . Monocytes Absolute 10/13/2014 0.8  0.2 - 0.9 K/uL Final  . Eosinophils Relative 10/13/2014 2   Final  . Eosinophils Absolute 10/13/2014 0.2  0 - 0.7 K/uL Final  . Basophils Relative 10/13/2014 1   Final  . Basophils Absolute 10/13/2014 0.1  0 - 0.1 K/uL Final  . Sodium 10/13/2014 135  135 - 145 mmol/L Final  . Potassium 10/13/2014 4.5  3.5 - 5.1 mmol/L Final  . Chloride 10/13/2014 104  101 - 111 mmol/L Final  . CO2 10/13/2014 26  22 - 32 mmol/L Final  . Glucose, Bld 10/13/2014 96  65 - 99 mg/dL Final  . BUN 10/13/2014 15  6 - 20 mg/dL Final  . Creatinine, Ser 10/13/2014 0.86  0.44 - 1.00 mg/dL Final  . Calcium 10/13/2014 8.3* 8.9 - 10.3 mg/dL Final  . Total Protein 10/13/2014 7.2  6.5 - 8.1 g/dL Final  . Albumin 10/13/2014 4.0  3.5 - 5.0 g/dL Final  . AST 10/13/2014 23  15 - 41 U/L Final  . ALT 10/13/2014 16  14 - 54 U/L Final  . Alkaline Phosphatase 10/13/2014 82  38 - 126 U/L Final  . Total Bilirubin 10/13/2014 0.7  0.3 - 1.2 mg/dL Final  . GFR calc non Af Amer 10/13/2014 >60  >60 mL/min Final  . GFR calc Af Amer 10/13/2014 >60  >60 mL/min Final   Comment: (NOTE) The eGFR has been calculated using the CKD EPI equation. This calculation has not been validated in all clinical situations. eGFR's persistently <60 mL/min signify possible Chronic Kidney Disease.   . Anion gap 10/13/2014 5  5 - 15 Final      STUDIES: Dg Bone Density  10/08/2014   EXAM: DUAL X-RAY ABSORPTIOMETRY (DXA) FOR BONE MINERAL DENSITY  IMPRESSION: Dear Dr Oliva Bustard,  Your patient Vola Beneke completed a BMD test on 10/08/2014 using the [place] (analysis version: 14.10) manufactured by [employer]. The following summarizes the results of our evaluation.  PATIENT BIOGRAPHICAL: Name: Nori, Winegar Patient  ID: [id number] Birth Date: Jan 15, 1941 Height: 65.0 in. Gender: Female Exam Date: 10/08/2014 Weight: 127.5  lbs. Indications: Breast CA, Caucasian, Family Hx of Osteoporosis, Height Loss, High Risk Meds, History of Fracture (Adult), Hx of tobacco use, Hypothyroid, Hysterectomy, Oophorectomy Bilateral, Postmenopausal Fractures: Left foot, Right wrist Treatments: ASPRIN 81 MG, letrozole, lipitor, Synthroid, Vit D  ASSESSMENT:  The BMD measured at Femur Total Right is 0.608 g/cm2 with a T-score of -3.2. This patient is considered osteoporotic according to [facility]) criteria.  Site Region Measured Measured WHO Young Adult BMD Date       Age      Classification T-score AP Spine L1-L4 10/08/2014 74.8 Osteopenia -2.4 0.888 g/cm2  DualFemur Total Right 10/08/2014 74.8 Osteoporosis -3.2 0.608 g/cm2  World Health Organization [facility]) criteria for post-menopausal, Caucasian Women: Normal:       T-score at or above -1 SD Osteopenia:   T-score between -1 and -2.5 SD Osteoporosis: T-score at or below -2.5 SD  RECOMMENDATIONS: [place] recommends that FDA-approved medical therapies be considered in postmenopausal women and men age 58 or older with a: 1. Hip or vertebral (clinical or morphometric) fracture. 2. T-score of < -2.5 at the spine or hip. 3. Ten-year fracture probability by FRAX of 3% or greater for hip fracture or 20% or greater for major osteoporotic fracture.  All treatment decisions require clinical judgment and consideration of individual patient factors, including patient preferences, co-morbidities, previous drug use, risk factors not captured in the FRAX model (e.g. falls, vitamin D deficiency, increased bone turnover, interval significant decline in bone density) and possible under - or over-estimation of fracture risk by FRAX.  All patients should ensure an adequate intake of dietary calcium (1200 mg/d) and vitamin D (800 IU daily) unless contraindicated.  FOLLOW-UP:  People with diagnosed cases of osteoporosis or at high risk for fracture should have regular bone mineral density tests. For patients eligible for Medicare, routine testing is allowed once every 2 years. The testing frequency can be increased to one year for patients who have rapidly progressing disease, those who are receiving or discontinuing medical therapy to restore bone mass, or have additional risk factors.  I have reviewed this report, and agree with the above findings.   Electronically Signed   By: Earle Gell M.D.   On: 10/08/2014 14:26    ASSESSMENT: Carcinoma of breast stage IB disease.  Status post lumpectomy radiation therapy Patient was started on letrozole bone density study was obtained patient was advised vitamin D and calcium   MEDICAL DECISION MAKING:  All lab data has been reviewed.  Bone density on 23rd of June shows osteoporosis should be carefully followed  Patient expressed understanding and was in agreement with this plan. She also understands that She can call clinic at any time with any questions, concerns, or complaints.    No matching staging information was found for the patient.  Forest Gleason, MD   10/16/2014 8:07 PM

## 2014-10-27 ENCOUNTER — Encounter: Payer: Self-pay | Admitting: Family Medicine

## 2014-10-27 ENCOUNTER — Ambulatory Visit (INDEPENDENT_AMBULATORY_CARE_PROVIDER_SITE_OTHER): Payer: Commercial Managed Care - HMO | Admitting: Family Medicine

## 2014-10-27 VITALS — BP 112/58 | HR 62 | Temp 97.6°F | Ht 65.5 in | Wt 128.5 lb

## 2014-10-27 DIAGNOSIS — E785 Hyperlipidemia, unspecified: Secondary | ICD-10-CM

## 2014-10-27 DIAGNOSIS — J449 Chronic obstructive pulmonary disease, unspecified: Secondary | ICD-10-CM | POA: Diagnosis not present

## 2014-10-27 DIAGNOSIS — E038 Other specified hypothyroidism: Secondary | ICD-10-CM | POA: Diagnosis not present

## 2014-10-27 DIAGNOSIS — I10 Essential (primary) hypertension: Secondary | ICD-10-CM | POA: Diagnosis not present

## 2014-10-27 NOTE — Assessment & Plan Note (Signed)
Well controlled. Continue current medication.  

## 2014-10-27 NOTE — Assessment & Plan Note (Signed)
At last check well controlled, at goal LDL < 70.

## 2014-10-27 NOTE — Progress Notes (Signed)
Pre visit review using our clinic review tool, if applicable. No additional management support is needed unless otherwise documented below in the visit note. 

## 2014-10-27 NOTE — Progress Notes (Addendum)
Subjective:    Patient ID: Amy Stone, female    DOB: 1941/03/24, 74 y.o.   MRN: [id number]  HPI   74 year old female presents for 6 month follow up. She has had new diagnosis of breast cancer in last 6 months. 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 Oliva Bustard  Rad ONC: Dr. Donella Stade: completed whole breast radiation   She has also been found to have osteoporosis currently being followed.  She has tolerated everything recently very well! No pain.  No breast issues, no breast pain and bone pain. No mood issues. Denies depression. No weakness, Great appetite.   Hypertension:  Stable on atenolol and losartan BP Readings from Last 3 Encounters:  10/27/14 112/58  10/13/14 118/69  09/24/14 116/77  Using medication without problems or lightheadedness: None Chest pain with exertion:None Edema:None Short of breath:None Average home BPs: not checking Other issues: Only issue sis she has had some cramping in legs in last fe years off and on. Tried yellow mustard which has helped a lot.  She is drinking a lot of water.  Hyperlipidemia and hypothyroid were stable at last check in 04/2014.  COPD,stable no issues on spiriva.  AAA, PVD, iliac artery stenosis followed by vascular, Dr. Scot Dock , CVTS,Dr. Nils Pyle   CAD followed by cardiology: Dr. Rockey Situ Review of Systems  Constitutional: Negative for fever and fatigue.  HENT: Negative for ear pain.   Eyes: Negative for pain.  Respiratory: Negative for shortness of breath.   Cardiovascular: Negative for chest pain, palpitations and leg swelling.       No claudication  Gastrointestinal: Negative for diarrhea and constipation.       Objective:   Physical Exam  Constitutional: Vital signs are normal. She appears well-developed and well-nourished. She is cooperative.  Non-toxic appearance. She  does not appear ill. No distress.  HENT:  Head: Normocephalic.  Right Ear: Hearing, tympanic membrane, external ear and ear canal normal. Tympanic membrane is not erythematous, not retracted and not bulging.  Left Ear: Hearing, tympanic membrane, external ear and ear canal normal. Tympanic membrane is not erythematous, not retracted and not bulging.  Nose: No mucosal edema or rhinorrhea. Right sinus exhibits no maxillary sinus tenderness and no frontal sinus tenderness. Left sinus exhibits no maxillary sinus tenderness and no frontal sinus tenderness.  Mouth/Throat: Uvula is midline, oropharynx is clear and moist and mucous membranes are normal.  Eyes: Conjunctivae, EOM and lids are normal. Pupils are equal, round, and reactive to light. Lids are everted and swept, no foreign bodies found.  Neck: Trachea normal and normal range of motion. Neck supple. Carotid bruit is not present. No thyroid mass and no thyromegaly present.  Cardiovascular: Normal rate, regular rhythm, S1 normal, S2 normal, normal heart sounds, intact distal pulses and normal pulses.  Exam reveals no gallop and no friction rub.   No murmur heard. Pulmonary/Chest: Effort normal and breath sounds normal. No tachypnea. No respiratory distress. She has no decreased breath sounds. She has no wheezes. She has no rhonchi. She has no rales.  Abdominal: Soft. Normal appearance and bowel sounds are normal. There is no tenderness.  Neurological: She is alert.  Skin: Skin is warm, dry and intact. No rash noted.  Psychiatric: Her speech is normal and behavior is normal. Judgment and thought content normal. Her mood appears not anxious. Cognition and memory are normal. She does  not exhibit a depressed mood.          Assessment & Plan:

## 2014-10-27 NOTE — Assessment & Plan Note (Signed)
Stable on spiriva 

## 2014-10-27 NOTE — Assessment & Plan Note (Signed)
At last check well controlled.

## 2014-12-10 NOTE — Progress Notes (Signed)
"  Thinking of you" card mailed today.

## 2015-01-04 DIAGNOSIS — Z8507 Personal history of malignant neoplasm of pancreas: Secondary | ICD-10-CM | POA: Diagnosis not present

## 2015-01-04 DIAGNOSIS — R300 Dysuria: Secondary | ICD-10-CM | POA: Diagnosis not present

## 2015-01-11 DIAGNOSIS — Z8551 Personal history of malignant neoplasm of bladder: Secondary | ICD-10-CM | POA: Diagnosis not present

## 2015-01-31 ENCOUNTER — Emergency Department: Payer: Commercial Managed Care - HMO

## 2015-01-31 ENCOUNTER — Emergency Department
Admission: EM | Admit: 2015-01-31 | Discharge: 2015-01-31 | Disposition: A | Discharge status: Home / Self Care | Payer: Commercial Managed Care - HMO | Attending: Emergency Medicine | Admitting: Emergency Medicine

## 2015-01-31 ENCOUNTER — Other Ambulatory Visit: Payer: Self-pay

## 2015-01-31 DIAGNOSIS — Z87891 Personal history of nicotine dependence: Secondary | ICD-10-CM | POA: Insufficient documentation

## 2015-01-31 DIAGNOSIS — Z7982 Long term (current) use of aspirin: Secondary | ICD-10-CM | POA: Insufficient documentation

## 2015-01-31 DIAGNOSIS — I10 Essential (primary) hypertension: Secondary | ICD-10-CM | POA: Diagnosis not present

## 2015-01-31 DIAGNOSIS — R109 Unspecified abdominal pain: Secondary | ICD-10-CM | POA: Insufficient documentation

## 2015-01-31 DIAGNOSIS — Z79899 Other long term (current) drug therapy: Secondary | ICD-10-CM | POA: Diagnosis not present

## 2015-01-31 DIAGNOSIS — R112 Nausea with vomiting, unspecified: Secondary | ICD-10-CM | POA: Diagnosis not present

## 2015-01-31 DIAGNOSIS — R079 Chest pain, unspecified: Secondary | ICD-10-CM | POA: Diagnosis not present

## 2015-01-31 DIAGNOSIS — R0789 Other chest pain: Secondary | ICD-10-CM | POA: Diagnosis not present

## 2015-01-31 DIAGNOSIS — R0602 Shortness of breath: Secondary | ICD-10-CM | POA: Diagnosis not present

## 2015-01-31 LAB — COMPREHENSIVE METABOLIC PANEL
ALK PHOS: 86 U/L (ref 38–126)
ALT: 16 U/L (ref 14–54)
ANION GAP: 7 (ref 5–15)
AST: 27 U/L (ref 15–41)
Albumin: 4.5 g/dL (ref 3.5–5.0)
BILIRUBIN TOTAL: 0.9 mg/dL (ref 0.3–1.2)
BUN: 22 mg/dL — ABNORMAL HIGH (ref 6–20)
CHLORIDE: 97 mmol/L — AB (ref 101–111)
CO2: 26 mmol/L (ref 22–32)
Calcium: 9.3 mg/dL (ref 8.9–10.3)
Creatinine, Ser: 1.21 mg/dL — ABNORMAL HIGH (ref 0.44–1.00)
GFR calc non Af Amer: 43 mL/min — ABNORMAL LOW (ref >60)
GFR, EST AFRICAN AMERICAN: 49 mL/min — AB (ref >60)
Glucose, Bld: 120 mg/dL — ABNORMAL HIGH (ref 65–99)
Potassium: 3.8 mmol/L (ref 3.5–5.1)
Sodium: 130 mmol/L — ABNORMAL LOW (ref 135–145)
Total Protein: 7.5 g/dL (ref 6.5–8.1)

## 2015-01-31 LAB — CBC
HEMATOCRIT: 37.6 % (ref 35.0–47.0)
HEMOGLOBIN: 12.8 g/dL (ref 12.0–16.0)
MCH: 30.3 pg (ref 26.0–34.0)
MCHC: 34.2 g/dL (ref 32.0–36.0)
MCV: 88.5 fL (ref 80.0–100.0)
Platelets: 217 10*3/uL (ref 150–440)
RBC: 4.24 MIL/uL (ref 3.80–5.20)
RDW: 14.5 % (ref 11.5–14.5)
WBC: 15.1 10*3/uL — ABNORMAL HIGH (ref 3.6–11.0)

## 2015-01-31 LAB — TROPONIN I

## 2015-01-31 MED ORDER — ASPIRIN 81 MG PO CHEW
324.0000 mg | CHEWABLE_TABLET | Freq: Once | ORAL | Status: AC
  Administered 2015-01-31: 324 mg via ORAL
  Filled 2015-01-31: qty 4

## 2015-01-31 MED ORDER — NITROGLYCERIN 2 % TD OINT
0.5000 [in_us] | TOPICAL_OINTMENT | Freq: Once | TRANSDERMAL | Status: AC
  Administered 2015-01-31: 0.5 [in_us] via TOPICAL
  Filled 2015-01-31: qty 1

## 2015-01-31 NOTE — ED Provider Notes (Signed)
[facility] Emergency Department Provider Note  ____________________________________________  Time seen: Approximately 2:38 AM  I have reviewed the triage vital signs and the nursing notes.   HISTORY  Chief Complaint Chest Pain and Abdominal Pain    HPI Amy Stone is a 74 y.o. female who presents to the ED from home with a chief complaint of chest pain. Onset of substernal chest tightness approximately 10 PM while at rest associated with 1 episode of nausea and vomiting and shortness of breath. Patient had nausea/vomiting/diarrhea 2 days, much improved yesterday and she began tolerating PO. Ate hot dog for dinner. Denies recent fever, chills, dysuria. Baseline loose cough times one year. Had some abdominal pain during her GI illness which have since resolved. Denies recent travel or trauma.Nothing makes her symptoms better or worse. States this does not feel like her typical angina which feels more like burning sensation.   Past Medical History  Diagnosis Date  . Epigastric pain   . Osteoporosis, unspecified   . Cervicalgia   . Unspecified tinnitus   . Neoplasm of uncertain behavior of skin   . Thoracic aneurysm, ruptured   . Other chest pain   . Breast screening, unspecified   . Unspecified essential hypertension   . Other and unspecified hyperlipidemia   . Unspecified hypothyroidism   . Osteoarthrosis, unspecified whether generalized or localized, unspecified site   . Chronic airway obstruction, not elsewhere classified   . Peripheral vascular disease   . Myocardial infarction 2007  . Coronary atherosclerosis of unspecified type of vessel, native or graft 2007  . PONV (postoperative nausea and vomiting)   . Shortness of breath   . Blood transfusion   . UTI (lower urinary tract infection)   . Cancer     Bladder  . Thyroid nodule     seen on CT Scan 07/19/2011  . AAA (abdominal aortic aneurysm)   . 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 Vitamin D deficiency 04/28/2014  . Counseling regarding end of life decision making 04/28/2014  . Low back pain radiating to left lower extremity 12/09/2013  . Aftercare following surgery of the circulatory system, [place] 04/02/2013  . BPPV (benign paroxysmal positional vertigo) 01/23/2013  . Atherosclerosis of native arteries of extremity with intermittent claudication ([place]) 03/27/2012  . AAA (abdominal aortic aneurysm) ([place]) 09/06/2011  . Atherosclerotic PVD with intermittent claudication ([place]) 09/06/2011  . Iliac artery stenosis, bilateral ([place]) 05/10/2011  . Moderate COPD (chronic obstructive pulmonary disease) ([place]) 01/15/2009  . Osteoporosis 05/27/2008  . COMMON MIGRAINE 02/05/2008  . ABDOMINAL AORTIC ANEURYSM 08/21/2007  . Hypothyroid 04/15/2007  . Hyperlipidemia 04/15/2007  . Essential hypertension, benign 04/15/2007  . Coronary atherosclerosis 04/15/2007  . OSTEOARTHRITIS 04/15/2007    Past Surgical History  Procedure Laterality Date  . Acute inferior mi  11/2005  . Port wine stain removal w/ laser  2004    bladder cancer  . Goiter removal  1979  . Coronary artery bypass graft  11/2005  . Aorta - bilateral femoral artery bypass graft  07/25/2011    Procedure: AORTA BIFEMORAL BYPASS GRAFT;  Surgeon: Angelia Mould, MD;  Location: [place];  Service: Vascular;  Laterality: Bilateral;  . Abdominal hysterectomy    . Pr vein bypass graft,aorto-fem-pop  July 25, 2011  . Abdominal aortagram N/A 05/15/2011    Procedure: ABDOMINAL AORTAGRAM;  Surgeon:  Angelia Mould, MD;  [facility] C[facility] LAB;  Service: Cardiovascular;  Laterality: N/A;    Current Outpatient Rx  Name  Route  Sig  Dispense  Refill  . aspirin EC 81 MG tablet   Oral   Take 81 mg by mouth daily.         [name] atenolol (TENORMIN) 25 MG tablet      TAKE ONE TABLET BY MOUTH ONCE DAILY   90 tablet   3    . atorvastatin (LIPITOR) 80 MG tablet   Oral   Take 1 tablet (80 mg total) by mouth daily.   90 tablet   3   . Cholecalciferol (VITAMIN D3) 50000 UNITS CAPS      1 tb po weekly x 12 weeks   12 capsule   0   . letrozole (FEMARA) 2.5 MG tablet   Oral   Take 1 tablet (2.5 mg total) by mouth daily.   30 tablet   6   . levothyroxine (SYNTHROID, LEVOTHROID) 50 MCG tablet      TAKE ONE-HALF TABLET BY MOUTH ONCE DAILY   45 tablet   3   . losartan (COZAAR) 50 MG tablet   Oral   Take 1 tablet (50 mg total) by mouth daily.   30 tablet   11   . tiotropium (SPIRIVA HANDIHALER) 18 MCG inhalation capsule      INHALE ONE DOSE BY MOUTH ONCE DAILY   90 capsule   3     Allergies Iohexol and Prednisone  Family History  Problem Relation Age of Onset  . Alzheimer's disease Father   . Parkinsonism Father   . Heart defect Mother     enlarged heart  . Heart attack Mother   . Hypertension Sister   . Diabetes Sister   . Heart disease Sister   . Hyperlipidemia Sister   . Hypothyroidism Sister   . Aneurysm Paternal Grandfather     Social History Social History  Substance Use Topics  . Smoking status: Former Smoker -- 1 years    Types: Cigarettes    Quit date: 11/24/2005  . Smokeless tobacco: Never Used     Comment: 40 pack years   . Alcohol Use: No    Review of Systems Constitutional: No fever/chills Eyes: No visual changes. ENT: No sore throat. Cardiovascular: Positive for chest pain. Respiratory: Positive for shortness of breath. Gastrointestinal: No abdominal pain.  Positive for nausea and vomiting.  No diarrhea.  No constipation. Genitourinary: Negative for dysuria. Musculoskeletal: Negative for back pain. Skin: Negative for rash. Neurological: Negative for headaches, focal weakness or numbness.  10-point ROS otherwise negative.  ____________________________________________   PHYSICAL EXAM:  VITAL SIGNS: ED Triage Vitals  Enc Vitals Group     BP  01/31/15 0045 104/79 mmHg     Pulse Rate 01/31/15 0045 79     Resp 01/31/15 0045 18     Temp 01/31/15 0045 97.9 F (36.6 C)     Temp Source 01/31/15 0045 Oral     SpO2 01/31/15 0045 95 %     Weight 01/31/15 0045 130 lb (58.968 kg)     Height 01/31/15 0045 '5\' 4"'$  (1.626 m)     Head Cir --      Peak Flow --      Pain Score 01/31/15 0046 5     Pain Loc --      Pain Edu? --      Excl. in [place]? --  Constitutional: Alert and oriented. Well appearing and in no acute distress. Eyes: Conjunctivae are normal. PERRL. EOMI. Head: Atraumatic. Nose: No congestion/rhinnorhea. Mouth/Throat: Mucous membranes are moist.  Oropharynx non-erythematous. Neck: No stridor. No carotid bruits. Cardiovascular: Normal rate, regular rhythm. Grossly normal heart sounds.  Good peripheral circulation. Respiratory: Normal respiratory effort.  No retractions. Lungs CTAB. Gastrointestinal: Soft and nontender to light and deep palpation. No distention. No abdominal bruits. No CVA tenderness. Musculoskeletal: No lower extremity tenderness nor edema.  No joint effusions. Neurologic:  Normal speech and language. No gross focal neurologic deficits are appreciated. No gait instability. Skin:  Skin is warm, dry and intact. No rash noted. Psychiatric: Mood and affect are normal. Speech and behavior are normal.  ____________________________________________   LABS (all labs ordered are listed, but only abnormal results are displayed)  Labs Reviewed  CBC - Abnormal; Notable for the following:    WBC 15.1 (*)    All other components within normal limits  COMPREHENSIVE METABOLIC PANEL - Abnormal; Notable for the following:    Sodium 130 (*)    Chloride 97 (*)    Glucose, Bld 120 (*)    BUN 22 (*)    Creatinine, Ser 1.21 (*)    GFR calc non Af Amer 43 (*)    GFR calc Af Amer 49 (*)    All other components within normal limits  TROPONIN I  TROPONIN I   ____________________________________________  EKG  ED ECG  REPORT I, SUNG,JADE J, the attending physician, personally viewed and interpreted this ECG.   Date: 01/31/2015  EKG Time: 0047  Rate: 77  Rhythm: normal EKG, normal sinus rhythm  Axis: Normal  Intervals:none  ST&T Change: Nonspecific Unchanged from prior ____________________________________________  RADIOLOGY  Chest 2 view (viewed by me, interpreted per Dr. Posey Pronto): No active cardiopulmonary disease. ____________________________________________   PROCEDURES  Procedure(s) performed: None  Critical Care performed: No  ____________________________________________   INITIAL IMPRESSION / ASSESSMENT AND PLAN / ED COURSE  Pertinent labs & imaging results that were available during my care of the patient were reviewed by me and considered in my medical decision making (see chart for details).  74 year old female with a prior history of CAD s/p CABG who presents with substernal chest pain not similar to her prior anginal equivalent. Symptoms not suggestive of aneurysm, dissection or PE. Will empirically administer aspirin, nitroglycerin paste, obtain basic labs including troponin; obtain chest x-ray. Initial troponin negative. As patient's symptoms do not appear consistent with ACS, will repeat 3 hour troponin and reassess.  ----------------------------------------- 5:56 AM on 01/31/2015 -----------------------------------------  Updated patient and spouse of repeat negative troponin and unchanged EKG. Patient resting in no acute distress. Denies chest pain, shortness of breath, nausea or any other symptoms. Strict return precautions given. Patient to follow up with her cardiologist Dr. Rockey Situ in 1-2 days. Patient and spouse verbalized understanding and agree with plan of care. ____________________________________________   FINAL CLINICAL IMPRESSION(S) / ED DIAGNOSES  Final diagnoses:  Chest pain, unspecified chest pain type      Paulette Blanch, MD 01/31/15 [phone]

## 2015-01-31 NOTE — ED Notes (Addendum)
Pt says she was laying in bed about 2 hours ago and began to have pain to the center of her chest; N/V-last vomited 30 minutes pta; pt says since Thursday evening she's had N/V, cough, abd pain with tightness; pt with history of MI but did not have any pain when that occured

## 2015-01-31 NOTE — Discharge Instructions (Signed)
Please continue all medications as directed by your doctor. As we discussed, your chest pain does not seem to be coming from your heart. It might be secondary to your recent GI illness. Please return to the ER at once for recurrent or worsening symptoms, persistent vomiting, difficulty breathing or other concerns.  Nonspecific Chest Pain  Chest pain can be caused by many different conditions. There is always a chance that your pain could be related to something serious, such as a heart attack or a blood clot in your lungs. Chest pain can also be caused by conditions that are not life-threatening. If you have chest pain, it is very important to follow up with your health care provider. CAUSES  Chest pain can be caused by:  Heartburn.  Pneumonia or bronchitis.  Anxiety or stress.  Inflammation around your heart (pericarditis) or lung (pleuritis or pleurisy).  A blood clot in your lung.  A collapsed lung (pneumothorax). It can develop suddenly on its own (spontaneous pneumothorax) or from trauma to the chest.  Shingles infection (varicella-zoster virus).  Heart attack.  Damage to the bones, muscles, and cartilage that make up your chest wall. This can include:  Bruised bones due to injury.  Strained muscles or cartilage due to frequent or repeated coughing or overwork.  Fracture to one or more ribs.  Sore cartilage due to inflammation (costochondritis). RISK FACTORS  Risk factors for chest pain may include:  Activities that increase your risk for trauma or injury to your chest.  Respiratory infections or conditions that cause frequent coughing.  Medical conditions or overeating that can cause heartburn.  Heart disease or family history of heart disease.  Conditions or health behaviors that increase your risk of developing a blood clot.  Having had chicken pox (varicella zoster). SIGNS AND SYMPTOMS Chest pain can feel like:  Burning or tingling on the surface of your  chest or deep in your chest.  Crushing, pressure, aching, or squeezing pain.  Dull or sharp pain that is worse when you move, cough, or take a deep breath.  Pain that is also felt in your back, neck, shoulder, or arm, or pain that spreads to any of these areas. Your chest pain may come and go, or it may stay constant. DIAGNOSIS Lab tests or other studies may be needed to find the cause of your pain. Your health care provider may have you take a test called an ambulatory ECG (electrocardiogram). An ECG records your heartbeat patterns at the time the test is performed. You may also have other tests, such as:  Transthoracic echocardiogram (TTE). During echocardiography, sound waves are used to create a picture of all of the heart structures and to look at how blood flows through your heart.  Transesophageal echocardiogram (TEE).This is a more advanced imaging test that obtains images from inside your body. It allows your health care provider to see your heart in finer detail.  Cardiac monitoring. This allows your health care provider to monitor your heart rate and rhythm in real time.  Holter monitor. This is a portable device that records your heartbeat and can help to diagnose abnormal heartbeats. It allows your health care provider to track your heart activity for several days, if needed.  Stress tests. These can be done through exercise or by taking medicine that makes your heart beat more quickly.  Blood tests.  Imaging tests. TREATMENT  Your treatment depends on what is causing your chest pain. Treatment may include:  Medicines. These may include:  Acid blockers for heartburn.  Anti-inflammatory medicine.  Pain medicine for inflammatory conditions.  Antibiotic medicine, if an infection is present.  Medicines to dissolve blood clots.  Medicines to treat coronary artery disease.  Supportive care for conditions that do not require medicines. This may  include:  Resting.  Applying heat or cold packs to injured areas.  Limiting activities until pain decreases. HOME CARE INSTRUCTIONS  If you were prescribed an antibiotic medicine, finish it all even if you start to feel better.  Avoid any activities that bring on chest pain.  Do not use any tobacco products, including cigarettes, chewing tobacco, or electronic cigarettes. If you need help quitting, ask your health care provider.  Do not drink alcohol.  Take medicines only as directed by your health care provider.  Keep all follow-up visits as directed by your health care provider. This is important. This includes any further testing if your chest pain does not go away.  If heartburn is the cause for your chest pain, you may be told to keep your head raised (elevated) while sleeping. This reduces the chance that acid will go from your stomach into your esophagus.  Make lifestyle changes as directed by your health care provider. These may include:  Getting regular exercise. Ask your health care provider to suggest some activities that are safe for you.  Eating a heart-healthy diet. A registered dietitian can help you to learn healthy eating options.  Maintaining a healthy weight.  Managing diabetes, if necessary.  Reducing stress. SEEK MEDICAL CARE IF:  Your chest pain does not go away after treatment.  You have a rash with blisters on your chest.  You have a fever. SEEK IMMEDIATE MEDICAL CARE IF:   Your chest pain is worse.  You have an increasing cough, or you cough up blood.  You have severe abdominal pain.  You have severe weakness.  You faint.  You have chills.  You have sudden, unexplained chest discomfort.  You have sudden, unexplained discomfort in your arms, back, neck, or jaw.  You have shortness of breath at any time.  You suddenly start to sweat, or your skin gets clammy.  You feel nauseous or you vomit.  You suddenly feel light-headed or  dizzy.  Your heart begins to beat quickly, or it feels like it is skipping beats. These symptoms may represent a serious problem that is an emergency. Do not wait to see if the symptoms will go away. Get medical help right away. Call your local emergency services (911 in the [place] Do not drive yourself to the hospital.   This information is not intended to replace advice given to you by your health care provider. Make sure you discuss any questions you have with your health care provider.   Document Released: 01/11/2005 Document Revised: 04/24/2014 Document Reviewed: 11/07/2013 Elsevier Interactive Patient Education [employer].

## 2015-01-31 NOTE — ED Notes (Signed)
MD at bedside. 

## 2015-01-31 NOTE — ED Notes (Signed)
Pt provided pillow. 

## 2015-02-05 ENCOUNTER — Ambulatory Visit
Admission: RE | Admit: 2015-02-05 | Discharge: 2015-02-05 | Disposition: A | Discharge status: Home / Self Care | Payer: Commercial Managed Care - HMO | Source: Ambulatory Visit | Attending: Family Medicine | Admitting: Family Medicine

## 2015-02-05 ENCOUNTER — Encounter: Payer: Self-pay | Admitting: Family Medicine

## 2015-02-05 ENCOUNTER — Ambulatory Visit (INDEPENDENT_AMBULATORY_CARE_PROVIDER_SITE_OTHER): Payer: Commercial Managed Care - HMO | Admitting: Family Medicine

## 2015-02-05 VITALS — BP 129/67 | HR 58 | Temp 98.5°F | Ht 65.5 in | Wt 126.0 lb

## 2015-02-05 DIAGNOSIS — R1011 Right upper quadrant pain: Secondary | ICD-10-CM

## 2015-02-05 DIAGNOSIS — R1010 Upper abdominal pain, unspecified: Secondary | ICD-10-CM | POA: Insufficient documentation

## 2015-02-05 DIAGNOSIS — R1012 Left upper quadrant pain: Secondary | ICD-10-CM

## 2015-02-05 DIAGNOSIS — E871 Hypo-osmolality and hyponatremia: Secondary | ICD-10-CM | POA: Diagnosis not present

## 2015-02-05 DIAGNOSIS — D72829 Elevated white blood cell count, unspecified: Secondary | ICD-10-CM | POA: Diagnosis not present

## 2015-02-05 DIAGNOSIS — Z23 Encounter for immunization: Secondary | ICD-10-CM

## 2015-02-05 LAB — COMPREHENSIVE METABOLIC PANEL
ALBUMIN: 4.1 g/dL (ref 3.6–5.1)
ALT: 13 U/L (ref 6–29)
AST: 18 U/L (ref 10–35)
Alkaline Phosphatase: 73 U/L (ref 33–130)
BILIRUBIN TOTAL: 0.4 mg/dL (ref 0.2–1.2)
BUN: 8 mg/dL (ref 7–25)
CALCIUM: 9.4 mg/dL (ref 8.6–10.4)
CO2: 24 mmol/L (ref 20–31)
Chloride: 102 mmol/L (ref 98–110)
Creat: 0.79 mg/dL (ref 0.60–0.93)
Glucose, Bld: 117 mg/dL — ABNORMAL HIGH (ref 65–99)
Potassium: 3.8 mmol/L (ref 3.5–5.3)
Sodium: 136 mmol/L (ref 135–146)
TOTAL PROTEIN: 7.3 g/dL (ref 6.1–8.1)

## 2015-02-05 LAB — CBC
HEMATOCRIT: 34.9 % — AB (ref 36.0–46.0)
HEMOGLOBIN: 11.8 g/dL — AB (ref 12.0–15.0)
MCH: 29.9 pg (ref 26.0–34.0)
MCHC: 33.8 g/dL (ref 30.0–36.0)
MCV: 88.4 fL (ref 78.0–100.0)
MPV: 9.2 fL (ref 8.6–12.4)
Platelets: 248 10*3/uL (ref 150–400)
RBC: 3.95 MIL/uL (ref 3.87–5.11)
RDW: 14.5 % (ref 11.5–15.5)
WBC: 8.2 10*3/uL (ref 4.0–10.5)

## 2015-02-05 LAB — LIPASE: LIPASE: 12 U/L (ref 7–60)

## 2015-02-05 NOTE — Progress Notes (Signed)
Subjective:    Patient ID: Amy Stone, female    DOB: 08-29-1940, 74 y.o.   MRN: [id number]  HPI  74 year old female with complicated PMH including AAA, breast cancer, CAD, PVD, COPD, HTN presents  Following ER visit on 10/16 for chest pain and abdominal pain.  She was seen for substernal chest tightness, while at rest associated with 1 episode of nausea and vomiting and shortness of breath. Patient had nausea/vomiting/diarrhea 2 day. She hasd also experienced abdominal pain during her GI illness d. Denied recent travel or trauma.Nothing made her symptoms better or worse. Stated this does not feel like her typical angina which feels more like burning sensation.  In ER given asa, nitro Wbc 15.1 Na low 130  Likely low given V/D  Cr 1.21  EKG: unchanged  CXR: NAD  Troponin neg 1, 3 hr neg as well   She reports that now she is not much better, soreness in upper abdomen, bilateral.  Worse after eating. Nml po intake.  No fever, no chest pain, no sob, no cough. No further vomiting or diarrhea, BM daily to every other day. Good water intake. nml No heartburn.   Social History /Family History/Past Medical History reviewed and updated if needed. Multiple abd surgeries in past.   Review of Systems  Constitutional: Positive for fatigue. Negative for fever.  HENT: Negative for ear pain.   Eyes: Negative for pain.  Respiratory: Negative for chest tightness and shortness of breath.   Cardiovascular: Negative for chest pain, palpitations and leg swelling.  Gastrointestinal: Positive for abdominal pain. Negative for blood in stool.  Genitourinary: Negative for dysuria.       Objective:   Physical Exam  Constitutional: Vital signs are normal. She appears well-developed and well-nourished. She is cooperative.  Non-toxic appearance. She does not appear ill. No distress.  HENT:  Head: Normocephalic.  Right Ear: Hearing, tympanic membrane, external ear and ear canal normal.  Tympanic membrane is not erythematous, not retracted and not bulging.  Left Ear: Hearing, tympanic membrane, external ear and ear canal normal. Tympanic membrane is not erythematous, not retracted and not bulging.  Nose: No mucosal edema or rhinorrhea. Right sinus exhibits no maxillary sinus tenderness and no frontal sinus tenderness. Left sinus exhibits no maxillary sinus tenderness and no frontal sinus tenderness.  Mouth/Throat: Uvula is midline, oropharynx is clear and moist and mucous membranes are normal.  Eyes: Conjunctivae, EOM and lids are normal. Pupils are equal, round, and reactive to light. Lids are everted and swept, no foreign bodies found.  Neck: Trachea normal and normal range of motion. Neck supple. Carotid bruit is not present. No thyroid mass and no thyromegaly present.  Cardiovascular: Normal rate, regular rhythm, S1 normal, S2 normal, normal heart sounds, intact distal pulses and normal pulses.  Exam reveals no gallop and no friction rub.   No murmur heard. Pulmonary/Chest: Effort normal and breath sounds normal. No tachypnea. No respiratory distress. She has no decreased breath sounds. She has no wheezes. She has no rhonchi. She has no rales.  Abdominal: Soft. Normal appearance and bowel sounds are normal. There is no hepatosplenomegaly. There is tenderness in the right upper quadrant, epigastric area and left upper quadrant. There is rebound. There is no rigidity, no guarding and no CVA tenderness.  Neurological: She is alert.  Skin: Skin is warm, dry and intact. No rash noted.  Psychiatric: Her speech is normal and behavior is normal. Judgment and thought content normal. Her mood appears not  anxious. Cognition and memory are normal. She does not exhibit a depressed mood.          Assessment & Plan:   Bilateral  Upper abdominal pain. NO current chest pain ( neg cardiac eval in ER)  Unclear cause of increase wbc in ER.  ? Gallbladder issues, pancreatitis?  Eval with labs  and [place] today.   Will recheck sodium.. Likely was low due to emesis. Likely improved now on nml po intake.

## 2015-02-05 NOTE — Patient Instructions (Addendum)
Stop at lab on way out.   Stop at front desk to set up [place] of abd.  If severe pain or can't stop throwing up.. Go to ER.

## 2015-02-08 ENCOUNTER — Telehealth: Payer: Self-pay | Admitting: Family Medicine

## 2015-02-08 NOTE — Telephone Encounter (Signed)
PLEASE NOTE: All timestamps contained within this report are represented as [place] Standard Time. CONFIDENTIALTY NOTICE: This fax transmission is intended only for the addressee. It contains information that is legally privileged, confidential or otherwise protected from use or disclosure. If you are not the intended recipient, you are strictly prohibited from reviewing, disclosing, copying using or disseminating any of this information or taking any action in reliance on or regarding this information. If you have received this fax in error, please notify us immediately by telephone so that we can arrange for its return to [place]. Phone: [phone], Toll-Free: [phone], Fax: [phone] Page: 1 of 1 Call Id: [id number] [place] Patient Name: Amy Stone Gender: Female DOB: Sep 14, 1940 Age: 74 Y 1 M 26 D Return Phone Number: Address: City/State/Zip: [place] Client [place] Night - Client Client Site [place] Physician [place], Amy Contact Type Call Call Type Labs Is this call to report lab results? Yes Tech Name Janett Billow Name of Lab [facility] Phone Number [phone] option 2 Lab Reference Number [phone] [id number] Initial Comment Janett Billow from [facility] [phone] option 2, has a STAT lab to report Ref # N [id number] Nurse Assessment Guidelines Guideline Title Affirmed Question Affirmed Notes Nurse Date/Time (Eastern Time) Disp. Time Eilene Ghazi Time) Disposition Final User 02/05/2015 5:23:00 PM Faxed Lab Results to Provider Yes [facility] After Care Instructions Given Call Event Type User Date / Time Description

## 2015-02-10 ENCOUNTER — Other Ambulatory Visit: Payer: Self-pay | Admitting: Family Medicine

## 2015-02-24 ENCOUNTER — Ambulatory Visit: Payer: Commercial Managed Care - HMO | Admitting: Nurse Practitioner

## 2015-03-04 ENCOUNTER — Ambulatory Visit: Payer: Commercial Managed Care - HMO | Admitting: Physician Assistant

## 2015-03-19 ENCOUNTER — Ambulatory Visit: Payer: Commercial Managed Care - HMO | Admitting: Nurse Practitioner

## 2015-04-07 ENCOUNTER — Ambulatory Visit: Payer: Commercial Managed Care - HMO | Admitting: Radiation Oncology

## 2015-04-07 ENCOUNTER — Encounter: Payer: Self-pay | Admitting: Family

## 2015-04-14 ENCOUNTER — Ambulatory Visit (INDEPENDENT_AMBULATORY_CARE_PROVIDER_SITE_OTHER): Payer: Commercial Managed Care - HMO | Admitting: Family

## 2015-04-14 ENCOUNTER — Ambulatory Visit (HOSPITAL_COMMUNITY)
Admission: RE | Admit: 2015-04-14 | Discharge: 2015-04-14 | Disposition: A | Discharge status: Home / Self Care | Payer: Commercial Managed Care - HMO | Source: Ambulatory Visit | Attending: Family | Admitting: Family

## 2015-04-14 ENCOUNTER — Encounter: Payer: Self-pay | Admitting: Family

## 2015-04-14 VITALS — BP 109/63 | HR 54 | Temp 97.9°F | Resp 14 | Ht 64.0 in | Wt 126.0 lb

## 2015-04-14 DIAGNOSIS — Z9889 Other specified postprocedural states: Secondary | ICD-10-CM

## 2015-04-14 DIAGNOSIS — Z4889 Encounter for other specified surgical aftercare: Secondary | ICD-10-CM | POA: Diagnosis not present

## 2015-04-14 DIAGNOSIS — I7409 Other arterial embolism and thrombosis of abdominal aorta: Secondary | ICD-10-CM

## 2015-04-14 DIAGNOSIS — Z48812 Encounter for surgical aftercare following surgery on the circulatory system: Secondary | ICD-10-CM

## 2015-04-14 DIAGNOSIS — Z87891 Personal history of nicotine dependence: Secondary | ICD-10-CM

## 2015-04-14 NOTE — Progress Notes (Signed)
[facility] [place] HISTORY AND PHYSICAL -PAD  History of Present Illness Amy Stone is a 74 y.o. female patient of Dr. Scot Dock who is status post AAA and aortic bifemoral bypass graft April 2013 for significant aortoiliac occlusive disease which was addressed at the time of her aneurysm repair with aortobifemoral bypass grafting. She returns today for PAD follow up, she denies any claudication symptoms with walking any distance. Her legs do hurt with walking on concrete floor only. She denies non-healing wounds.  She denies any stroke or TIA history. She does flexibility and strengthening exercises indoors in the cold months and is active outdoors in the warm months.   She did have a 4 vessel CABG in 2007 and an MI, she stopped smoking then.  She reports New Medical or Surgical History: right breast cancer, had partial mastectomy and radiation tx.  Pt Diabetic: No Pt smoker: former smoker, quit in 2007  Pt meds include: Statin :Yes Betablocker: Yes ASA: Yes Other anticoagulants/antiplatelets: no   Past Medical History  Diagnosis Date  . Epigastric pain   . Osteoporosis, unspecified   . Cervicalgia   . Unspecified tinnitus   . Neoplasm of uncertain behavior of skin   . Thoracic aneurysm, ruptured ([place])   . Other chest pain   . Breast screening, unspecified   . Unspecified essential hypertension   . Other and unspecified hyperlipidemia   . Unspecified hypothyroidism   . Osteoarthrosis, unspecified whether generalized or localized, unspecified site   . Chronic airway obstruction, not elsewhere classified   . Peripheral vascular disease ([place])   . Myocardial infarction ([place]) 2007  . Coronary atherosclerosis of unspecified type of vessel, native or graft 2007  . PONV (postoperative nausea and vomiting)   . Shortness of breath   . Blood transfusion   . UTI (lower urinary tract infection)   . Cancer [facility])     Bladder  . Thyroid nodule     seen on  CT Scan 07/19/2011  . AAA (abdominal aortic aneurysm) ([place])   . 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 Smoking status: Former Smoker -- 1 years    Types: Cigarettes    Quit date: 11/24/2005  . Smokeless tobacco: Never Used     Comment: 40 pack years   . Alcohol Use: No    Family History Family History  Problem Relation Age of Onset  . Alzheimer's disease Father   . Parkinsonism Father   . Heart defect Mother     enlarged heart  . Heart attack Mother   . Hypertension Sister   . Diabetes Sister   . Heart disease Sister   . Hyperlipidemia Sister   . Hypothyroidism Sister   . Aneurysm Paternal Grandfather     Past Surgical History  Procedure Laterality Date  . Acute inferior mi  11/2005  . Port wine stain removal w/ laser  2004    bladder cancer  . Goiter removal  1979  . Coronary artery bypass graft  11/2005  . Aorta - bilateral femoral artery bypass graft  07/25/2011    Procedure: AORTA BIFEMORAL BYPASS GRAFT;  Surgeon: Angelia Mould, MD;  Location: [place];  Service: Vascular;  Laterality: Bilateral;  . Abdominal hysterectomy    . Pr vein bypass graft,aorto-fem-pop  July 25, 2011  . Abdominal aortagram N/A 05/15/2011    Procedure: ABDOMINAL Maxcine Ham;  Surgeon: Angelia Mould, MD;  Location: [place] CATH LAB;  Service: Cardiovascular;  Laterality: N/A;    Allergies  Allergen Reactions  . Iohexol      Code: HIVES, Desc: CONTRAST REACTION OF HIVES (LARGE WHELPS DEVELOPED OVER ENTIRE BODY/MMS   . Prednisone Other (See Comments)    hallucinations    Current Outpatient Prescriptions  Medication Sig Dispense Refill  . aspirin EC 81 MG tablet Take 81 mg by mouth daily.    [name] atenolol (TENORMIN) 25 MG tablet TAKE ONE TABLET BY MOUTH ONCE DAILY 90 tablet 3  . atorvastatin (LIPITOR) 80 MG tablet Take 1 tablet (80 mg total) by mouth daily. 90 tablet 3  . benazepril (LOTENSIN) 10 MG tablet Take 10 mg by mouth  daily.     [name] letrozole (FEMARA) 2.5 MG tablet Take 1 tablet (2.5 mg total) by mouth daily. 30 tablet 6  . levothyroxine (SYNTHROID, LEVOTHROID) 50 MCG tablet TAKE ONE-HALF TABLET BY MOUTH ONCE DAILY 45 tablet 3  . losartan (COZAAR) 50 MG tablet Take 1 tablet (50 mg total) by mouth daily. 30 tablet 11  . SPIRIVA HANDIHALER 18 MCG inhalation capsule INHALE ONE DOSE BY MOUTH ONCE DAILY 90 capsule 1   No current facility-administered medications for this visit.    ROS: See HPI for pertinent positives and negatives.   Physical Examination  Filed Vitals:   04/14/15 1053  BP: 109/63  Pulse: 54  Temp: 97.9 F (36.6 C)  TempSrc: Oral  Resp: 14  Height: '5\' 4"'$  (1.626 m)  Weight: 126 lb (57.153 kg)  SpO2: 97%   Body mass index is 21.62 kg/(m^2).   General: A&O x 3, WDWN Gait: normal Eyes: PERRLA, Pulmonary: CTAB, without wheezes, rales or rhonchi Cardiac: regular rhythm, no detected murmur     Carotid Bruits Left Right   Negative Negative  Aorta: is faintly palpable Radial pulses: are 2+ and =   VASCULAR EXAM: Extremities without ischemic changes  without Gangrene; without open wounds.     LE Pulses LEFT RIGHT   FEMORAL 2+palpable 2+ palpable    POPLITEAL not palpable  not palpable   POSTERIOR TIBIAL not palpable  not palpable    DORSALIS PEDIS  ANTERIOR TIBIAL 1+ palpable  1+ palpable    Abdomen: soft, NT, no palpable masses. Skin: no rashes, no ulcers. Musculoskeletal: no muscle wasting or atrophy. Neurologic: A&O X 3; Appropriate Affect ; SENSATION: normal; MOTOR FUNCTION: moving all extremities equally, motor strength 5/5 throughout. Speech is fluent/normal.  CN 2-12 intact.          Non-Invasive Vascular  Imaging: DATE: 04/14/2015  ABI (Date: 04/14/2015)  R: 1.17 (1.33, 04/08/14), DP: triphasic, PT: triphasic, TBI: 0.80  L: 1.18 (1.23), DP: triphasic, PT: triphasic, TBI: 0.70   ASSESSMENT: Amy Stone is a 74 y.o. female who is status post aortic bifemoral bypass graft in April 2013. She has no claudication symptoms, no tissue loss, no back or abdominal pain.   04/08/14  lower arterial Duplex demonstrated patent distal limbs of aorto-bi-femoral bypass graft, no hemodynamically significant plaque or thrombus visualized at limited imaged segments.  Today's ABI's and TBI's remain normal with all triphasic waveforms.     PLAN:  Continue active lifestyle.  Based on the patient's vascular studies and examination, pt will return to clinic in 1 year with ABI's.  I discussed in depth with the patient the nature of atherosclerosis, and emphasized the importance of maximal medical management including strict control of blood pressure, blood glucose, and lipid levels, obtaining regular exercise,  and continued cessation of smoking.  The patient is aware that without maximal medical management the underlying atherosclerotic disease process will progress, limiting the benefit of any interventions.  The patient was given information about PAD including signs, symptoms, treatment, what symptoms should prompt the patient to seek immediate medical care, and risk reduction measures to take.  Clemon Chambers, RN, MSN, FNP-C [facility] Phone: [phone]  Clinic MD: Scot Dock  04/14/2015 10:53 AM

## 2015-04-14 NOTE — Patient Instructions (Signed)
Peripheral Vascular Disease Peripheral vascular disease (PVD) is a disease of the blood vessels that are not part of your heart and brain. A simple term for PVD is poor circulation. In most cases, PVD narrows the blood vessels that carry blood from your heart to the rest of your body. This can result in a decreased supply of blood to your arms, legs, and internal organs, like your stomach or kidneys. However, it most often affects a person's lower legs and feet. There are two types of PVD.  Organic PVD. This is the more common type. It is caused by damage to the structure of blood vessels.  Functional PVD. This is caused by conditions that make blood vessels contract and tighten (spasm). Without treatment, PVD tends to get worse over time. PVD can also lead to acute ischemic limb. This is when an arm or limb suddenly has trouble getting enough blood. This is a medical emergency. CAUSES Each type of PVD has many different causes. The most common cause of PVD is buildup of a fatty material (plaque) inside of your arteries (atherosclerosis). Small amounts of plaque can break off from the walls of the blood vessels and become lodged in a smaller artery. This blocks blood flow and can cause acute ischemic limb. Other common causes of PVD include:  Blood clots that form inside of blood vessels.  Injuries to blood vessels.  Diseases that cause inflammation of blood vessels or cause blood vessel spasms.  Health behaviors and health history that increase your risk of developing PVD. RISK FACTORS  You may have a greater risk of PVD if you:  Have a family history of PVD.  Have certain medical conditions, including:  High cholesterol.  Diabetes.  High blood pressure (hypertension).  Coronary heart disease.  Past problems with blood clots.  Past injury, such as burns or a broken bone. These may have damaged blood vessels in your limbs.  Buerger disease. This is caused by inflamed blood  vessels in your hands and feet.  Some forms of arthritis.  Rare birth defects that affect the arteries in your legs.  Use tobacco.  Do not get enough exercise.  Are obese.  Are age 50 or older. SIGNS AND SYMPTOMS  PVD may cause many different symptoms. Your symptoms depend on what part of your body is not getting enough blood. Some common signs and symptoms include:  Cramps in your lower legs. This may be a symptom of poor leg circulation (claudication).  Pain and weakness in your legs while you are physically active that goes away when you rest (intermittent claudication).  Leg pain when at rest.  Leg numbness, tingling, or weakness.  Coldness in a leg or foot, especially when compared with the other leg.  Skin or hair changes. These can include:  Hair loss.  Shiny skin.  Pale or bluish skin.  Thick toenails.  Inability to get or maintain an erection (erectile dysfunction). People with PVD are more prone to developing ulcers and sores on their toes, feet, or legs. These may take longer than normal to heal. DIAGNOSIS Your health care provider may diagnose PVD from your signs and symptoms. The health care provider will also do a physical exam. You may have tests to find out what is causing your PVD and determine its severity. Tests may include:  Blood pressure recordings from your arms and legs and measurements of the strength of your pulses (pulse volume recordings).  Imaging studies using sound waves to take pictures of   the blood flow through your blood vessels (Doppler ultrasound).  Injecting a dye into your blood vessels before having imaging studies using:  X-rays (angiogram or arteriogram).  Computer-generated X-rays (CT angiogram).  A powerful electromagnetic field and a computer (magnetic resonance angiogram or MRA). TREATMENT Treatment for PVD depends on the cause of your condition and the severity of your symptoms. It also depends on your age. Underlying  causes need to be treated and controlled. These include long-lasting (chronic) conditions, such as diabetes, high cholesterol, and high blood pressure. You may need to first try making lifestyle changes and taking medicines. Surgery may be needed if these do not work. Lifestyle changes may include:  Quitting smoking.  Exercising regularly.  Following a low-fat, low-cholesterol diet. Medicines may include:  Blood thinners to prevent blood clots.  Medicines to improve blood flow.  Medicines to improve your blood cholesterol levels. Surgical procedures may include:  A procedure that uses an inflated balloon to open a blocked artery and improve blood flow (angioplasty).  A procedure to put in a tube (stent) to keep a blocked artery open (stent implant).  Surgery to reroute blood flow around a blocked artery (peripheral bypass surgery).  Surgery to remove dead tissue from an infected wound on the affected limb.  Amputation. This is surgical removal of the affected limb. This may be necessary in cases of acute ischemic limb that are not improved through medical or surgical treatments. HOME CARE INSTRUCTIONS  Take medicines only as directed by your health care provider.  Do not use any tobacco products, including cigarettes, chewing tobacco, or electronic cigarettes. If you need help quitting, ask your health care provider.  Lose weight if you are overweight, and maintain a healthy weight as directed by your health care provider.  Eat a diet that is low in fat and cholesterol. If you need help, ask your health care provider.  Exercise regularly. Ask your health care provider to suggest some good activities for you.  Use compression stockings or other mechanical devices as directed by your health care provider.  Take good care of your feet.  Wear comfortable shoes that fit well.  Check your feet often for any cuts or sores. SEEK MEDICAL CARE IF:  You have cramps in your legs  while walking.  You have leg pain when you are at rest.  You have coldness in a leg or foot.  Your skin changes.  You have erectile dysfunction.  You have cuts or sores on your feet that are not healing. SEEK IMMEDIATE MEDICAL CARE IF:  Your arm or leg turns cold and blue.  Your arms or legs become red, warm, swollen, painful, or numb.  You have chest pain or trouble breathing.  You suddenly have weakness in your face, arm, or leg.  You become very confused or lose the ability to speak.  You suddenly have a very bad headache or lose your vision.   This information is not intended to replace advice given to you by your health care provider. Make sure you discuss any questions you have with your health care provider.   Document Released: 05/11/2004 Document Revised: 04/24/2014 Document Reviewed: 09/11/2013 Elsevier Interactive Patient Education 2016 [employer]  

## 2015-04-15 ENCOUNTER — Ambulatory Visit: Payer: Commercial Managed Care - HMO | Admitting: Radiation Oncology

## 2015-04-15 ENCOUNTER — Other Ambulatory Visit: Payer: Commercial Managed Care - HMO

## 2015-04-15 ENCOUNTER — Ambulatory Visit: Payer: Commercial Managed Care - HMO | Admitting: Oncology

## 2015-04-21 ENCOUNTER — Other Ambulatory Visit: Payer: Self-pay | Admitting: Family Medicine

## 2015-04-27 ENCOUNTER — Other Ambulatory Visit (INDEPENDENT_AMBULATORY_CARE_PROVIDER_SITE_OTHER): Payer: Commercial Managed Care - HMO

## 2015-04-27 ENCOUNTER — Telehealth: Payer: Self-pay | Admitting: Family Medicine

## 2015-04-27 ENCOUNTER — Other Ambulatory Visit: Payer: Commercial Managed Care - HMO

## 2015-04-27 DIAGNOSIS — E785 Hyperlipidemia, unspecified: Secondary | ICD-10-CM

## 2015-04-27 DIAGNOSIS — E038 Other specified hypothyroidism: Secondary | ICD-10-CM

## 2015-04-27 DIAGNOSIS — D72829 Elevated white blood cell count, unspecified: Secondary | ICD-10-CM

## 2015-04-27 DIAGNOSIS — E559 Vitamin D deficiency, unspecified: Secondary | ICD-10-CM

## 2015-04-27 LAB — LIPID PANEL
CHOLESTEROL: 130 mg/dL (ref 0–200)
HDL: 45.9 mg/dL (ref >39.00)
LDL CALC: 56 mg/dL (ref 0–99)
NonHDL: 84.43
TRIGLYCERIDES: 141 mg/dL (ref 0.0–149.0)
Total CHOL/HDL Ratio: 3
VLDL: 28.2 mg/dL (ref 0.0–40.0)

## 2015-04-27 LAB — T3, FREE: T3, Free: 3 pg/mL (ref 2.3–4.2)

## 2015-04-27 LAB — COMPREHENSIVE METABOLIC PANEL
ALBUMIN: 4.4 g/dL (ref 3.5–5.2)
ALT: 12 U/L (ref 0–35)
AST: 18 U/L (ref 0–37)
Alkaline Phosphatase: 97 U/L (ref 39–117)
BUN: 10 mg/dL (ref 6–23)
CALCIUM: 9.9 mg/dL (ref 8.4–10.5)
CHLORIDE: 105 meq/L (ref 96–112)
CO2: 26 mEq/L (ref 19–32)
Creatinine, Ser: 0.87 mg/dL (ref 0.40–1.20)
GFR: 67.4 mL/min (ref >60.00)
Glucose, Bld: 98 mg/dL (ref 70–99)
POTASSIUM: 4.2 meq/L (ref 3.5–5.1)
Sodium: 140 mEq/L (ref 135–145)
Total Bilirubin: 0.5 mg/dL (ref 0.2–1.2)
Total Protein: 7.3 g/dL (ref 6.0–8.3)

## 2015-04-27 LAB — VITAMIN D 25 HYDROXY (VIT D DEFICIENCY, FRACTURES): VITD: 39.52 ng/mL (ref 30.00–100.00)

## 2015-04-27 LAB — T4, FREE: FREE T4: 0.99 ng/dL (ref 0.60–1.60)

## 2015-04-27 LAB — TSH: TSH: 0.89 u[IU]/mL (ref 0.35–4.50)

## 2015-04-27 NOTE — Telephone Encounter (Signed)
-----   Message from Marchia Bond sent at 04/20/2015 11:03 AM EST ----- Regarding: cpx labs Tues 1/10, need orders. Thanks! :-) Please order  future cpx labs for pt's upcoming lab appt. Thanks Aniceto Boss

## 2015-05-04 ENCOUNTER — Encounter: Payer: Self-pay | Admitting: Family Medicine

## 2015-05-04 ENCOUNTER — Ambulatory Visit (INDEPENDENT_AMBULATORY_CARE_PROVIDER_SITE_OTHER): Payer: Commercial Managed Care - HMO | Admitting: Family Medicine

## 2015-05-04 ENCOUNTER — Other Ambulatory Visit: Payer: Self-pay | Admitting: Family Medicine

## 2015-05-04 VITALS — BP 118/64 | HR 60 | Temp 98.0°F | Ht 65.0 in | Wt 124.0 lb

## 2015-05-04 DIAGNOSIS — Z0000 Encounter for general adult medical examination without abnormal findings: Secondary | ICD-10-CM

## 2015-05-04 DIAGNOSIS — I25709 Atherosclerosis of coronary artery bypass graft(s), unspecified, with unspecified angina pectoris: Secondary | ICD-10-CM | POA: Diagnosis not present

## 2015-05-04 DIAGNOSIS — Z7189 Other specified counseling: Secondary | ICD-10-CM | POA: Insufficient documentation

## 2015-05-04 DIAGNOSIS — E785 Hyperlipidemia, unspecified: Secondary | ICD-10-CM

## 2015-05-04 DIAGNOSIS — J449 Chronic obstructive pulmonary disease, unspecified: Secondary | ICD-10-CM

## 2015-05-04 DIAGNOSIS — E559 Vitamin D deficiency, unspecified: Secondary | ICD-10-CM

## 2015-05-04 DIAGNOSIS — E038 Other specified hypothyroidism: Secondary | ICD-10-CM | POA: Diagnosis not present

## 2015-05-04 DIAGNOSIS — Z8551 Personal history of malignant neoplasm of bladder: Secondary | ICD-10-CM | POA: Diagnosis not present

## 2015-05-04 MED ORDER — LOSARTAN POTASSIUM 50 MG PO TABS: 50.0000 mg | ORAL_TABLET | Freq: Every day | ORAL | Status: DC

## 2015-05-04 MED ORDER — TIOTROPIUM BROMIDE MONOHYDRATE 18 MCG IN CAPS: ORAL_CAPSULE | RESPIRATORY_TRACT | Status: DC

## 2015-05-04 MED ORDER — ATENOLOL 25 MG PO TABS: 25.0000 mg | ORAL_TABLET | Freq: Every day | ORAL | Status: DC

## 2015-05-04 MED ORDER — ATORVASTATIN CALCIUM 80 MG PO TABS: 80.0000 mg | ORAL_TABLET | Freq: Every day | ORAL | Status: DC

## 2015-05-04 NOTE — Progress Notes (Signed)
Pre visit review using our clinic review tool, if applicable. No additional management support is needed unless otherwise documented below in the visit note. 

## 2015-05-04 NOTE — Progress Notes (Signed)
I have personally reviewed the Medicare Annual Wellness questionnaire and have noted 1. The patient's medical and social history 2. Their use of alcohol, tobacco or illicit drugs 3. Their current medications and supplements 4. The patient's functional ability including ADL's, fall risks, home safety risks and hearing or visual             impairment. 5. Diet and physical activities 6. Evidence for depression or mood disorders 7.         Updated provider list Cognitive evaluation was performed and recorded on pt medicare questionnaire form. The patients weight, height, BMI and visual acuity have been recorded in the chart  I have made referrals, counseling and provided education to the patient based review of the above and I have provided the pt with a written personalized care plan for preventive services.   Right ductal breast cancer,February of 2016 status post lumpectomy and sentinel lymph node evaluation (June 02, 2014) treated by Dr. Bryson Ha, last follow up was 09/2014. Seeing q6 months Occ right chest pain if reaching up. Has healing scar.  Hypertension: Well controlled previously on current meds: atenolol, lotensin. ( She has not eaten BP med yet today) BP Readings from Last 3 Encounters:  05/04/15 118/64  04/14/15 109/63  02/05/15 129/67  Using medication without problems or lightheadedness: No Chest pain with exertion:None Edema:None  Short of breath:mild  Average home BPs: well controlled  Other issues:   Body mass index is 20.63 kg/(m^2).   Hx of bladder cancer many years ago. Was followed by Dr. Rogers Blocker. Needs referral for insurance.  Same issue with seeing Dr. Rockey Situ Cardiologist for CAD.  Elevated Cholesterol: LDL at goal <70 on simvastatin 80 mg daily  Lab Results  Component Value Date   CHOL 130 04/27/2015   HDL 45.90 04/27/2015   LDLCALC 56 04/27/2015   TRIG 141.0 04/27/2015   CHOLHDL 3 04/27/2015  Using medications without problems:None  Muscle  aches: None  Diet compliance:Eating a lot of fresh fruit and veggies.  Exercise: walks a lot, cutting wood  Other complaints:   Hypothyroid: Stable control on current dose  Lab Results  Component Value Date   TSH 0.89 04/27/2015   COPD, on spiriva: Daily mild SOB with exertion despite this.  Vit D now nml..  Review of Systems  Constitutional: Negative for fever and fatigue.  HENT: Negative for ear pain.  Eyes: Negative for pain.  Respiratory: No cough, no PND Cardiovascular: Negative for chest pain, palpitations and leg swelling.  Gastrointestinal: Negative for abdominal pain.  Genitourinary: Negative for dysuria.  Objective:   Physical Exam  Constitutional: Vital signs are normal. She appears well-developed and well-nourished. She is cooperative. Non-toxic appearance. She does not appear ill. No distress.  HENT:  Head: Normocephalic.  Right Ear: Hearing, tympanic membrane, external ear and ear canal normal. Tympanic membrane is not erythematous, not retracted and not bulging.  Left Ear: Hearing, tympanic membrane, external ear and ear canal normal. Tympanic membrane is not erythematous, not retracted and not bulging.  Nose: Mucosal edema and rhinorrhea present. Right sinus exhibits no maxillary sinus tenderness and no frontal sinus tenderness. Left sinus exhibits no maxillary sinus tenderness and no frontal sinus tenderness.  Mouth/Throat: Uvula is midline, oropharynx is clear and moist and mucous membranes are normal.  Eyes: Conjunctivae normal, EOM and lids are normal. Pupils are equal, round, and reactive to light. No foreign bodies found.  Neck: Trachea normal and normal range of motion. Neck supple. Carotid bruit is not  present. No mass and no thyromegaly present.  Cardiovascular: Normal rate, regular rhythm, S1 normal, S2 normal, normal heart sounds, intact distal pulses and normal pulses. Exam reveals no gallop and no friction rub.  No murmur heard.   Pulmonary/Chest: Effort normal and breath sounds normal. Not tachypneic. No respiratory distress. She has no decreased breath sounds. She has no wheezes. She has no rhonchi. She has no rales.  Abdominal: Soft. Normal appearance and bowel sounds are normal. She exhibits no distension, no fluid wave, no abdominal bruit and no mass. There is no hepatosplenomegaly. There is no tenderness. There is no rebound, no guarding and no CVA tenderness. No hernia.  Genitourinary: No breast swelling, tenderness, discharge or bleeding. Pelvic exam was performed with patient supine.  Lymphadenopathy:  She has no cervical adenopathy.  She has no axillary adenopathy.  Neurological: She is alert. She has normal strength. No cranial nerve deficit or sensory deficit.  Skin: Skin is warm, dry and intact. No rash noted.  Psychiatric: Her speech is normal and behavior is normal. Judgment normal. Her mood appears not anxious. Cognition and memory are normal. She does not exhibit a depressed mood.  Assessment & Plan:   The patient's preventative maintenance and recommended screening tests for an annual wellness exam were reviewed in full today.  Brought up to date unless services declined.  Counselled on the importance of diet, exercise, and its role in overall health and mortality.  The patient's FH and SH was reviewed, including their home life, tobacco status, and drug and alcohol status.   Former smoker  Quit in last 10 year, 21  Years but 1/2 pack per day: 25 pack year history. Vaccines:Uptodate. Mammogram:  Right ductal breast cancer,February of 2016 status post lumpectomy and sentinel lymph node evaluation (June 02, 2014) treated by Dr. Bryson Ha, last follow up was 09/2014 Colon: 2009 Dr. Kristopher Glee, polyp, due for repeat in 2019 DEXA: 2016  Osteoporosis -3.2 hip on Femara currently. Stopped fosamax in past given cost. DVE/PAP: not indicated, TAH for non-cancer reasons.

## 2015-05-04 NOTE — Progress Notes (Signed)
See drug warning with Benazepril and Losartan. Okay to refill?

## 2015-05-04 NOTE — Assessment & Plan Note (Signed)
Resolved

## 2015-05-04 NOTE — Assessment & Plan Note (Signed)
Stable control on current treatment.

## 2015-05-04 NOTE — Assessment & Plan Note (Signed)
Well controlled. Continue current medication.  

## 2015-05-04 NOTE — Assessment & Plan Note (Signed)
Given EOL packet to review.

## 2015-05-04 NOTE — Assessment & Plan Note (Signed)
Well controlled. Continue current medication. Encouraged exercise, weight loss, healthy eating habits.  

## 2015-05-04 NOTE — Assessment & Plan Note (Signed)
In remission. Needs referral to uro for continued care.

## 2015-05-04 NOTE — Patient Instructions (Signed)
Stop at front desk to set up referral.

## 2015-05-06 ENCOUNTER — Telehealth: Payer: Self-pay | Admitting: *Deleted

## 2015-05-06 ENCOUNTER — Inpatient Hospital Stay: Payer: Commercial Managed Care - HMO | Attending: Oncology

## 2015-05-06 ENCOUNTER — Ambulatory Visit
Admission: RE | Admit: 2015-05-06 | Discharge: 2015-05-06 | Disposition: A | Discharge status: Home / Self Care | Payer: Commercial Managed Care - HMO | Source: Ambulatory Visit | Attending: Radiation Oncology | Admitting: Radiation Oncology

## 2015-05-06 ENCOUNTER — Other Ambulatory Visit: Payer: Self-pay | Admitting: *Deleted

## 2015-05-06 ENCOUNTER — Encounter: Payer: Self-pay | Admitting: Radiation Oncology

## 2015-05-06 ENCOUNTER — Encounter: Payer: Self-pay | Admitting: Oncology

## 2015-05-06 ENCOUNTER — Inpatient Hospital Stay (HOSPITAL_BASED_OUTPATIENT_CLINIC_OR_DEPARTMENT_OTHER): Payer: Commercial Managed Care - HMO | Admitting: Oncology

## 2015-05-06 VITALS — BP 121/65 | HR 59 | Temp 96.8°F | Resp 21

## 2015-05-06 VITALS — BP 113/71 | HR 53 | Temp 96.6°F | Resp 18 | Wt 125.9 lb

## 2015-05-06 DIAGNOSIS — E785 Hyperlipidemia, unspecified: Secondary | ICD-10-CM | POA: Insufficient documentation

## 2015-05-06 DIAGNOSIS — Z170 Estrogen receptor positive status [ER+]: Secondary | ICD-10-CM

## 2015-05-06 DIAGNOSIS — Z79899 Other long term (current) drug therapy: Secondary | ICD-10-CM

## 2015-05-06 DIAGNOSIS — E039 Hypothyroidism, unspecified: Secondary | ICD-10-CM | POA: Diagnosis not present

## 2015-05-06 DIAGNOSIS — Z79811 Long term (current) use of aromatase inhibitors: Secondary | ICD-10-CM | POA: Diagnosis not present

## 2015-05-06 DIAGNOSIS — Z951 Presence of aortocoronary bypass graft: Secondary | ICD-10-CM | POA: Insufficient documentation

## 2015-05-06 DIAGNOSIS — D0511 Intraductal carcinoma in situ of right breast: Secondary | ICD-10-CM | POA: Diagnosis not present

## 2015-05-06 DIAGNOSIS — Z87891 Personal history of nicotine dependence: Secondary | ICD-10-CM | POA: Diagnosis not present

## 2015-05-06 DIAGNOSIS — J449 Chronic obstructive pulmonary disease, unspecified: Secondary | ICD-10-CM | POA: Diagnosis not present

## 2015-05-06 DIAGNOSIS — C50919 Malignant neoplasm of unspecified site of unspecified female breast: Secondary | ICD-10-CM

## 2015-05-06 DIAGNOSIS — I10 Essential (primary) hypertension: Secondary | ICD-10-CM | POA: Diagnosis not present

## 2015-05-06 DIAGNOSIS — C50911 Malignant neoplasm of unspecified site of right female breast: Secondary | ICD-10-CM | POA: Diagnosis not present

## 2015-05-06 DIAGNOSIS — Z923 Personal history of irradiation: Secondary | ICD-10-CM | POA: Insufficient documentation

## 2015-05-06 DIAGNOSIS — I252 Old myocardial infarction: Secondary | ICD-10-CM | POA: Insufficient documentation

## 2015-05-06 DIAGNOSIS — I739 Peripheral vascular disease, unspecified: Secondary | ICD-10-CM | POA: Diagnosis not present

## 2015-05-06 DIAGNOSIS — Z7982 Long term (current) use of aspirin: Secondary | ICD-10-CM | POA: Diagnosis not present

## 2015-05-06 DIAGNOSIS — I2510 Atherosclerotic heart disease of native coronary artery without angina pectoris: Secondary | ICD-10-CM | POA: Insufficient documentation

## 2015-05-06 LAB — COMPREHENSIVE METABOLIC PANEL
ALT: 14 U/L (ref 14–54)
AST: 19 U/L (ref 15–41)
Albumin: 4.2 g/dL (ref 3.5–5.0)
Alkaline Phosphatase: 86 U/L (ref 38–126)
Anion gap: 4 — ABNORMAL LOW (ref 5–15)
BILIRUBIN TOTAL: 0.5 mg/dL (ref 0.3–1.2)
BUN: 19 mg/dL (ref 6–20)
CALCIUM: 9.2 mg/dL (ref 8.9–10.3)
CHLORIDE: 101 mmol/L (ref 101–111)
CO2: 27 mmol/L (ref 22–32)
CREATININE: 0.9 mg/dL (ref 0.44–1.00)
GFR calc non Af Amer: >60 mL/min (ref >60)
GLUCOSE: 118 mg/dL — AB (ref 65–99)
Potassium: 4.2 mmol/L (ref 3.5–5.1)
Sodium: 132 mmol/L — ABNORMAL LOW (ref 135–145)
Total Protein: 7.2 g/dL (ref 6.5–8.1)

## 2015-05-06 LAB — CBC WITH DIFFERENTIAL/PLATELET
Basophils Absolute: 0.1 10*3/uL (ref 0–0.1)
Basophils Relative: 1 %
EOS PCT: 1 %
Eosinophils Absolute: 0.1 10*3/uL (ref 0–0.7)
HEMATOCRIT: 35.5 % (ref 35.0–47.0)
Hemoglobin: 12 g/dL (ref 12.0–16.0)
LYMPHS ABS: 2.4 10*3/uL (ref 1.0–3.6)
LYMPHS PCT: 26 %
MCH: 29.2 pg (ref 26.0–34.0)
MCHC: 33.8 g/dL (ref 32.0–36.0)
MCV: 86.4 fL (ref 80.0–100.0)
MONO ABS: 0.8 10*3/uL (ref 0.2–0.9)
Monocytes Relative: 9 %
Neutro Abs: 6 10*3/uL (ref 1.4–6.5)
Neutrophils Relative %: 63 %
PLATELETS: 213 10*3/uL (ref 150–440)
RBC: 4.11 MIL/uL (ref 3.80–5.20)
RDW: 14.4 % (ref 11.5–14.5)
WBC: 9.4 10*3/uL (ref 3.6–11.0)

## 2015-05-06 MED ORDER — LETROZOLE 2.5 MG PO TABS: 2.5000 mg | ORAL_TABLET | Freq: Every day | ORAL | Status: DC

## 2015-05-06 NOTE — Telephone Encounter (Signed)
Notify pt that when sending in her rx... Noted both benazepril and losartan on med list.  At last cardiology OV 10/2014.[name] She was told to stop benazepril and start losartan. Please make sure she is not taking both.   I spoke with [name] and while on phone she pulled the benazepril from her medications and states she will redo her medicine box to make sure she in only taking the losartan as instructed.

## 2015-05-06 NOTE — Progress Notes (Signed)
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  Diagnosis in now February of 2016 status post lumpectomy and sentinel lymph node evaluation (June 02, 2014) Total size of tumor is 9 mm.  Sentinel lymph nodes were negative.  T1b N0 M0 tumor estrogen receptor and progesterone receptor positive.  HER-2/neu receptor negative. 2. Patient has started letrozole after she has  finishing radiation therapy      INTERVAL HISTORY: 75 year old lady with a history of carcinoma breast stage IB with ductal carcinoma in situ and invasive ductal carcinoma status post lumpectomy and radiation therapy.  Estrogen receptor progesterone receptor positive.  HER-2/neu receptor negative.  Patient has finished radiation therapy  patient is here for further follow-up Re: carcinoma breast.  Tolerating letrozole very well. Has been scheduled to get mammograms done in February. The calcium and vitamin D.  Patient had a bone density study last June.  No bony pain no bony fracture REVIEW OF SYSTEMS:   GENERAL:  Feels good.  Active.  No fevers, sweats or weight loss. PERFORMANCE STATUS (ECOG):  01 HEENT:  No visual changes, runny nose, sore throat, mouth sores or tenderness. Lungs: No shortness of breath or cough.  No hemoptysis. Cardiac:  No chest pain, palpitations, orthopnea, or PND. GI:  No nausea, vomiting, diarrhea, constipation, melena or hematochezia. GU:  No urgency, frequency, dysuria, or hematuria. Musculoskeletal:  No back pain.  No joint pain.  No muscle tenderness. Extremities:  No pain or swelling. Skin:  No  rashes or skin changes. Neuro:  No headache, numbness or weakness, balance or coordination issues. Endocrine:  No diabetes, thyroid issues, hot flashes or night sweats. Psych:  No mood changes, depression or anxiety. Pain:  No focal pain. Review of systems:  All other systems reviewed and found to be negative. As per HPI. Otherwise, a complete review of systems is negatve.  PAST MEDICAL HISTORY: Past Medical History  Diagnosis Date  . Epigastric pain   . Osteoporosis, unspecified   . Cervicalgia   . Unspecified tinnitus   . Neoplasm of uncertain behavior of skin   . Thoracic aneurysm, ruptured ([place])   . Other chest pain   . Breast screening, unspecified   . Unspecified essential hypertension   . Other and unspecified hyperlipidemia   . Unspecified hypothyroidism   . Osteoarthrosis, unspecified whether generalized or localized, unspecified site   . Chronic airway obstruction, not elsewhere classified   . Peripheral vascular disease ([place])   . Myocardial infarction ([place]) 2007  . Coronary atherosclerosis of unspecified type of vessel, native or graft 2007  . PONV (postoperative nausea and vomiting)   . Shortness of breath   . Blood transfusion   . UTI (lower urinary tract infection)   . Cancer [facility])     Bladder  . Thyroid nodule     seen on CT Scan 07/19/2011  . AAA (abdominal aortic aneurysm) ([place])   . 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 Acute inferior mi  11/2005  . Port wine stain removal w/ laser  2004  bladder cancer  . Goiter removal  1979  . Coronary artery bypass graft  11/2005  . Aorta - bilateral femoral artery bypass graft  07/25/2011    Procedure: AORTA BIFEMORAL BYPASS GRAFT;  Surgeon: Angelia Mould, MD;  Location: [place];  Service: Vascular;  Laterality: Bilateral;  . Abdominal hysterectomy    . Pr vein bypass graft,aorto-fem-pop  July 25, 2011  . Abdominal aortagram N/A  05/15/2011    Procedure: ABDOMINAL Maxcine Ham;  Surgeon: Angelia Mould, MD;  Location: [facility] CATH LAB;  Service: Cardiovascular;  Laterality: N/A;  . Breast lumpectomy Right Feb. 2016    FAMILY HISTORY Family History  Problem Relation Age of Onset  . Alzheimer's disease Father   . Parkinsonism Father   . Heart defect Mother     enlarged heart  . Heart attack Mother   . Hypertension Sister   . Diabetes Sister   . Heart disease Sister   . Hyperlipidemia Sister   . Hypothyroidism Sister   . Aneurysm Paternal Grandfather     ADVANCED DIRECTIVES She does not have a living will.  Record indicates that patient underwent counseling regarding that.  HEALTH MAINTENANCE: Social History  Substance Use Topics  . Smoking status: Former Smoker -- 1 years    Types: Cigarettes    Quit date: 11/24/2005  . Smokeless tobacco: Never Used     Comment: 40 pack years   . Alcohol Use: No      Allergies  Allergen Reactions  . Iohexol      Code: HIVES, Desc: CONTRAST REACTION OF HIVES (LARGE WHELPS DEVELOPED OVER ENTIRE BODY/MMS   . Prednisone Other (See Comments)    hallucinations    Current Outpatient Prescriptions  Medication Sig Dispense Refill  . aspirin EC 81 MG tablet Take 81 mg by mouth daily.    . benazepril (LOTENSIN) 10 MG tablet Take 10 mg by mouth daily.    [name] letrozole (FEMARA) 2.5 MG tablet Take 1 tablet (2.5 mg total) by mouth daily. 30 tablet 6  . levothyroxine (SYNTHROID, LEVOTHROID) 50 MCG tablet TAKE ONE-HALF TABLET BY MOUTH ONCE DAILY 45 tablet 3  . losartan (COZAAR) 50 MG tablet Take 1 tablet (50 mg total) by mouth daily. 90 tablet 3  . simvastatin (ZOCOR) 80 MG tablet Take 80 mg by mouth daily.    [name] tiotropium (SPIRIVA HANDIHALER) 18 MCG inhalation capsule INHALE ONE DOSE BY MOUTH ONCE DAILY 90 capsule 3   No current facility-administered medications for this visit.    OBJECTIVE:  Filed Vitals:   05/06/15 1536  BP: 113/71  Pulse: 53  Temp: 96.6 F (35.9  C)  Resp: 18     Body mass index is 20.95 kg/(m^2).    ECOG FS:1 - Symptomatic but completely ambulatory  PHYSICAL EXAM: GENERAL:  Well developed, well nourished, sitting comfortably in the exam room in no acute distress. MENTAL STATUS:  Alert and oriented to person, place and time. HEAD:Normocephalic, atraumatic, face symmetric, no Cushingoid features. EYES:    Pupils equal round and reactive to light and accomodation.  No conjunctivitis or scleral icterus. ENT:  Oropharynx clear without lesion.  Tongue normal. Mucous membranes moist.  RESPIRATORY:  Clear to auscultation without rales, wheezes or rhonchi. CARDIOVASCULAR:  Regular rate and rhythm without murmur, rub or gallop. BREAST:  Right breast without masses, skin changes or nipple discharge.  Left breast without masses, skin changes   Due to radiationor nipple discharge. ABDOMEN:  Soft, non-tender, with active bowel sounds, and no  hepatosplenomegaly.  No masses. BACK:  No CVA tenderness.  No tenderness on percussion of the back or rib cage. SKIN:  No rashes, ulcers or lesions. EXTREMITIES: No edema, no skin discoloration or tenderness.  No palpable cords. LYMPH NODES: No palpable cervical, supraclavicular, axillary or inguinal adenopathy  NEUROLOGICAL: Unremarkable. PSYCH:  Appropriate.   LAB RESULTS:  Appointment on 05/06/2015  Component Date Value Ref Range Status  . WBC 05/06/2015 9.4  3.6 - 11.0 K/uL Final  . RBC 05/06/2015 4.11  3.80 - 5.20 MIL/uL Final  . Hemoglobin 05/06/2015 12.0  12.0 - 16.0 g/dL Final  . HCT 05/06/2015 35.5  35.0 - 47.0 % Final  . MCV 05/06/2015 86.4  80.0 - 100.0 fL Final  . MCH 05/06/2015 29.2  26.0 - 34.0 pg Final  . MCHC 05/06/2015 33.8  32.0 - 36.0 g/dL Final  . RDW 05/06/2015 14.4  11.5 - 14.5 % Final  . Platelets 05/06/2015 213  150 - 440 K/uL Final  . Neutrophils Relative % 05/06/2015 63   Final  . Neutro Abs 05/06/2015 6.0  1.4 - 6.5 K/uL Final  . Lymphocytes Relative 05/06/2015 26    Final  . Lymphs Abs 05/06/2015 2.4  1.0 - 3.6 K/uL Final  . Monocytes Relative 05/06/2015 9   Final  . Monocytes Absolute 05/06/2015 0.8  0.2 - 0.9 K/uL Final  . Eosinophils Relative 05/06/2015 1   Final  . Eosinophils Absolute 05/06/2015 0.1  0 - 0.7 K/uL Final  . Basophils Relative 05/06/2015 1   Final  . Basophils Absolute 05/06/2015 0.1  0 - 0.1 K/uL Final  . Sodium 05/06/2015 132* 135 - 145 mmol/L Final  . Potassium 05/06/2015 4.2  3.5 - 5.1 mmol/L Final  . Chloride 05/06/2015 101  101 - 111 mmol/L Final  . CO2 05/06/2015 27  22 - 32 mmol/L Final  . Glucose, Bld 05/06/2015 118* 65 - 99 mg/dL Final  . BUN 05/06/2015 19  6 - 20 mg/dL Final  . Creatinine, Ser 05/06/2015 0.90  0.44 - 1.00 mg/dL Final  . Calcium 05/06/2015 9.2  8.9 - 10.3 mg/dL Final  . Total Protein 05/06/2015 7.2  6.5 - 8.1 g/dL Final  . Albumin 05/06/2015 4.2  3.5 - 5.0 g/dL Final  . AST 05/06/2015 19  15 - 41 U/L Final  . ALT 05/06/2015 14  14 - 54 U/L Final  . Alkaline Phosphatase 05/06/2015 86  38 - 126 U/L Final  . Total Bilirubin 05/06/2015 0.5  0.3 - 1.2 mg/dL Final  . GFR calc non Af Amer 05/06/2015 >60  >60 mL/min Final  . GFR calc Af Amer 05/06/2015 >60  >60 mL/min Final   Comment: (NOTE) The eGFR has been calculated using the CKD EPI equation. This calculation has not been validated in all clinical situations. eGFR's persistently <60 mL/min signify possible Chronic Kidney Disease.   . Anion gap 05/06/2015 4* 5 - 15 Final      STUDIES: No results found.  ASSESSMENT: Carcinoma of breast stage IB disease.  Status post lumpectomy radiation therapy Patient was started on letrozole bone density study was obtained patient was advised vitamin D and calcium   mammogram is been arranged in February of 2017.  On density was done in summer of 2016 [place]:  All lab data has been reviewed.  Bone density on 23rd of June shows osteoporosis should be carefully followed   continue letrozole  vitamin D and calcium.  Repeat bone density in coming summer.  If patient continues to have progressive osteoporosis then possibility of starting a biphosphonate therapy can be considered.   No matching staging information was found for the patient.  Forest Gleason, MD   05/06/2015 3:52 PM

## 2015-05-06 NOTE — Progress Notes (Signed)
Radiation Oncology Follow up Note  Name: Amy Stone   Date:   05/06/2015 MRN:  [id number] DOB: 1940-05-20    This 75 y.o. 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  REFERRING PROVIDER: Jinny Sanders, MD  HPI: Patient is a 75 year old female now 7 months out having completed whole breast radiation to her right breast status post wide local excision for a stage IA invasive mammary carcinoma ER/PR positive HER-2/neu not overexpressed. Seen today in routine follow-up she is doing well. She has not had a mammogram since completing treatments I am ordering that. She currently is on. Letrozole tolerating that well without side effect she was on Fosamax although has discontinued that because of cost. She specifically denies breast tenderness cough or bone pain.  COMPLICATIONS OF TREATMENT: none  FOLLOW UP COMPLIANCE: keeps appointments   PHYSICAL EXAM:  BP 121/65 mmHg  Pulse 59  Temp(Src) 96.8 F (36 C)  Resp 21 She has sacrifice of the right nipple areolar complex. No dominant mass or nodularity is noted in either breast in 2 positions examined. No axillary or supraclavicular adenopathy is noted. Well-developed well-nourished patient in NAD. HEENT reveals PERLA, EOMI, discs not visualized.  Oral cavity is clear. No oral mucosal lesions are identified. Neck is clear without evidence of cervical or supraclavicular adenopathy. Lungs are clear to A&P. Cardiac examination is essentially unremarkable with regular rate and rhythm without murmur rub or thrill. Abdomen is benign with no organomegaly or masses noted. Motor sensory and DTR levels are equal and symmetric in the upper and lower extremities. Cranial nerves II through XII are grossly intact. Proprioception is intact. No peripheral adenopathy or edema is identified. No motor or sensory  levels are noted. Crude visual fields are within normal range.  RADIOLOGY RESULTS: I've ordered bilateral diagnostic mammograms  PLAN: Present time she is doing well I've ordered mammograms will review them when they become available. Otherwise I'm please were overall progress. I've asked to see her back in 6 months for follow-up. She continues on letrozole without side effect. Patient knows to call sooner with any concerns.  I would like to take this opportunity for allowing me to participate in the care of your patient.Armstead Peaks., MD

## 2015-05-19 ENCOUNTER — Ambulatory Visit (INDEPENDENT_AMBULATORY_CARE_PROVIDER_SITE_OTHER): Payer: Commercial Managed Care - HMO | Admitting: Cardiovascular Disease

## 2015-05-19 ENCOUNTER — Encounter: Payer: Self-pay | Admitting: Cardiovascular Disease

## 2015-05-19 VITALS — BP 140/72 | HR 69 | Ht 64.0 in | Wt 125.2 lb

## 2015-05-19 DIAGNOSIS — I25709 Atherosclerosis of coronary artery bypass graft(s), unspecified, with unspecified angina pectoris: Secondary | ICD-10-CM | POA: Diagnosis not present

## 2015-05-19 DIAGNOSIS — I771 Stricture of artery: Secondary | ICD-10-CM

## 2015-05-19 DIAGNOSIS — E785 Hyperlipidemia, unspecified: Secondary | ICD-10-CM | POA: Diagnosis not present

## 2015-05-19 DIAGNOSIS — I10 Essential (primary) hypertension: Secondary | ICD-10-CM | POA: Diagnosis not present

## 2015-05-19 MED ORDER — ATENOLOL 25 MG PO TABS: 25.0000 mg | ORAL_TABLET | Freq: Every day | ORAL | Status: DC

## 2015-05-19 NOTE — Assessment & Plan Note (Signed)
Currently with no symptoms of angina. No further workup at this time. Continue current medication regimen. 

## 2015-05-19 NOTE — Patient Instructions (Signed)
You are doing well. No medication changes were made.  Please call us if you have new issues that need to be addressed before your next appt.  Your physician wants you to follow-up in: 6 months.  You will receive a reminder letter in the mail two months in advance. If you don't receive a letter, please call our office to schedule the follow-up appointment.   

## 2015-05-19 NOTE — Assessment & Plan Note (Signed)
ABI done December 2016 in [place],  Normal study , she denies any claudication symptoms

## 2015-05-19 NOTE — Assessment & Plan Note (Signed)
Agree with recent medication changes, stopping been as pill, continue atenolol, losartan

## 2015-05-19 NOTE — Assessment & Plan Note (Signed)
Cholesterol is at goal on the current lipid regimen. No changes to the medications were made.  

## 2015-05-19 NOTE — Progress Notes (Signed)
Patient ID: Amy Stone, female    DOB: Jul 17, 1940, 75 y.o.   MRN: [id number]  HPI Comments: Amy Stone is a very pleasant 75 year old woman with a history of coronary artery disease, bypass x4 in 2007, old inferior MI, abdominal aortic aneurysm measuring 5 cm as well as severe iliac arterial disease, s/p abdominal aortic aneurysm repair in March 2014. She states that she had a long recovery. previously evaluated for shortness of breath and chest pain.  She presents today for follow-up of her coronary artery disease  Recent history of Right breast cancer, s/p chemo and XRT Finished treatment last year Dr. Tamala Julian, resection Doing well, Having right breast pain, medial side No left side chest pain symptoms, active at baseline, no complaints  EKG on today's visit shows normal sinus rhythm with rate 70 bpm with no significant ST or T-wave changes  Other past medical history reviewed  Total cholesterol 130,     Allergies  Allergen Reactions  . Iohexol      Code: HIVES, Desc: CONTRAST REACTION OF HIVES (LARGE WHELPS DEVELOPED OVER ENTIRE BODY/MMS   . Prednisone Other (See Comments)    hallucinations    Current Outpatient Prescriptions on File Prior to Visit  Medication Sig Dispense Refill  . aspirin EC 81 MG tablet Take 81 mg by mouth daily.    [name] letrozole (FEMARA) 2.5 MG tablet Take 1 tablet (2.5 mg total) by mouth daily. 90 tablet 3  . levothyroxine (SYNTHROID, LEVOTHROID) 50 MCG tablet TAKE ONE-HALF TABLET BY MOUTH ONCE DAILY 45 tablet 3  . losartan (COZAAR) 50 MG tablet Take 1 tablet (50 mg total) by mouth daily. 90 tablet 3  . simvastatin (ZOCOR) 80 MG tablet Take 80 mg by mouth daily.    [name] tiotropium (SPIRIVA HANDIHALER) 18 MCG inhalation capsule INHALE ONE DOSE BY MOUTH ONCE DAILY 90 capsule 3   No current facility-administered medications on file prior to visit.    Past Medical History  Diagnosis Date  . Epigastric pain   . Osteoporosis, unspecified   . Cervicalgia    . Unspecified tinnitus   . Neoplasm of uncertain behavior of skin   . Thoracic aneurysm, ruptured ([place])   . Other chest pain   . Breast screening, unspecified   . Unspecified essential hypertension   . Other and unspecified hyperlipidemia   . Unspecified hypothyroidism   . Osteoarthrosis, unspecified whether generalized or localized, unspecified site   . Chronic airway obstruction, not elsewhere classified   . Peripheral vascular disease ([place])   . Myocardial infarction ([place]) 2007  . Coronary atherosclerosis of unspecified type of vessel, native or graft 2007  . PONV (postoperative nausea and vomiting)   . Shortness of breath   . Blood transfusion   . UTI (lower urinary tract infection)   . Thyroid nodule     seen on CT Scan 07/19/2011  . AAA (abdominal aortic aneurysm) ([place])   . 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 Cancer [facility]) 2015    Bladder    Past Surgical History  Procedure Laterality Date  . Acute inferior mi  11/2005  . Port wine stain removal w/ laser  2004    bladder cancer  . Goiter removal  1979  . Coronary artery bypass graft  11/2005  . Aorta - bilateral femoral artery bypass graft  07/25/2011    Procedure: AORTA BIFEMORAL BYPASS GRAFT;  Surgeon: Angelia Mould, MD;  Location: [place];  Service: Vascular;  Laterality: Bilateral;  .  Abdominal hysterectomy    . Pr vein bypass graft,aorto-fem-pop  July 25, 2011  . Abdominal aortagram N/A 05/15/2011    Procedure: ABDOMINAL Maxcine Ham;  Surgeon: Angelia Mould, MD;  Location: [facility] CATH LAB;  Service: Cardiovascular;  Laterality: N/A;  . Breast lumpectomy Right Feb. 2016    Social History  reports that she quit smoking about 9 years ago. Her smoking use included Cigarettes. She quit after 1 year of use. She has never used smokeless tobacco. She reports that she does not drink alcohol or use illicit drugs.  Family History family history includes Alzheimer's disease in her father; Aneurysm in her  paternal grandfather; Diabetes in her sister; Heart attack in her mother; Heart defect in her mother; Heart disease in her sister; Hyperlipidemia in her sister; Hypertension in her sister; Hypothyroidism in her sister; Parkinsonism in her father.   Review of Systems  Constitutional: Negative.   Respiratory: Negative.   Cardiovascular: Negative.   Gastrointestinal:       Right upper  quadrant pain  Musculoskeletal: Negative.   Neurological: Negative.   Psychiatric/Behavioral: Negative.   All other systems reviewed and are negative.   BP 140/72 mmHg  Pulse 69  Ht '5\' 4"'$  (1.626 m)  Wt 125 lb 4 oz (56.813 kg)  BMI 21.49 kg/m2  Physical Exam  Constitutional: She is oriented to person, place, and time. She appears well-developed and well-nourished.  HENT:  Head: Normocephalic.  Nose: Nose normal.  Mouth/Throat: Oropharynx is clear and moist.  Eyes: Conjunctivae are normal. Pupils are equal, round, and reactive to light.  Neck: Normal range of motion. Neck supple. No JVD present.  Cardiovascular: Normal rate, regular rhythm, S1 normal, S2 normal, normal heart sounds and intact distal pulses.  Exam reveals no gallop and no friction rub.   No murmur heard. Pulmonary/Chest: Effort normal and breath sounds normal. No respiratory distress. She has no wheezes. She has no rales. She exhibits no tenderness.  Abdominal: Soft. Bowel sounds are normal. She exhibits no distension. There is no tenderness.  Musculoskeletal: Normal range of motion. She exhibits no edema or tenderness.  Lymphadenopathy:    She has no cervical adenopathy.  Neurological: She is alert and oriented to person, place, and time. Coordination normal.  Skin: Skin is warm and dry. No rash noted. No erythema.  Psychiatric: She has a normal mood and affect. Her behavior is normal. Judgment and thought content normal.    Assessment and Plan  Nursing note and vitals reviewed.

## 2015-05-24 ENCOUNTER — Ambulatory Visit
Admission: RE | Admit: 2015-05-24 | Discharge: 2015-05-24 | Disposition: A | Discharge status: Home / Self Care | Payer: Commercial Managed Care - HMO | Source: Ambulatory Visit | Attending: Radiation Oncology | Admitting: Radiation Oncology

## 2015-05-24 ENCOUNTER — Other Ambulatory Visit: Payer: Self-pay | Admitting: Radiation Oncology

## 2015-05-24 DIAGNOSIS — R928 Other abnormal and inconclusive findings on diagnostic imaging of breast: Secondary | ICD-10-CM | POA: Diagnosis not present

## 2015-05-24 DIAGNOSIS — Z853 Personal history of malignant neoplasm of breast: Secondary | ICD-10-CM | POA: Diagnosis not present

## 2015-05-24 DIAGNOSIS — Z923 Personal history of irradiation: Secondary | ICD-10-CM | POA: Diagnosis not present

## 2015-05-24 DIAGNOSIS — C50911 Malignant neoplasm of unspecified site of right female breast: Secondary | ICD-10-CM

## 2015-05-24 HISTORY — DX: Malignant neoplasm of unspecified site of unspecified female breast: C50.919

## 2015-06-14 DIAGNOSIS — Z9011 Acquired absence of right breast and nipple: Secondary | ICD-10-CM | POA: Diagnosis not present

## 2015-06-14 DIAGNOSIS — Z853 Personal history of malignant neoplasm of breast: Secondary | ICD-10-CM | POA: Diagnosis not present

## 2015-07-08 ENCOUNTER — Emergency Department: Payer: Commercial Managed Care - HMO

## 2015-07-08 ENCOUNTER — Encounter: Payer: Self-pay | Admitting: Emergency Medicine

## 2015-07-08 ENCOUNTER — Telehealth: Payer: Self-pay | Admitting: Family Medicine

## 2015-07-08 ENCOUNTER — Emergency Department
Admission: EM | Admit: 2015-07-08 | Discharge: 2015-07-08 | Disposition: A | Discharge status: Home / Self Care | Payer: Commercial Managed Care - HMO | Attending: Emergency Medicine | Admitting: Emergency Medicine

## 2015-07-08 DIAGNOSIS — R0789 Other chest pain: Secondary | ICD-10-CM | POA: Diagnosis not present

## 2015-07-08 DIAGNOSIS — Z87891 Personal history of nicotine dependence: Secondary | ICD-10-CM | POA: Diagnosis not present

## 2015-07-08 DIAGNOSIS — R079 Chest pain, unspecified: Secondary | ICD-10-CM | POA: Diagnosis not present

## 2015-07-08 DIAGNOSIS — I10 Essential (primary) hypertension: Secondary | ICD-10-CM | POA: Diagnosis not present

## 2015-07-08 DIAGNOSIS — R072 Precordial pain: Secondary | ICD-10-CM | POA: Diagnosis not present

## 2015-07-08 DIAGNOSIS — Z79899 Other long term (current) drug therapy: Secondary | ICD-10-CM | POA: Insufficient documentation

## 2015-07-08 DIAGNOSIS — J209 Acute bronchitis, unspecified: Secondary | ICD-10-CM | POA: Diagnosis not present

## 2015-07-08 DIAGNOSIS — Z7982 Long term (current) use of aspirin: Secondary | ICD-10-CM | POA: Diagnosis not present

## 2015-07-08 LAB — CBC
HEMATOCRIT: 38.3 % (ref 35.0–47.0)
Hemoglobin: 12.8 g/dL (ref 12.0–16.0)
MCH: 29.7 pg (ref 26.0–34.0)
MCHC: 33.3 g/dL (ref 32.0–36.0)
MCV: 89.2 fL (ref 80.0–100.0)
Platelets: 200 10*3/uL (ref 150–440)
RBC: 4.29 MIL/uL (ref 3.80–5.20)
RDW: 15.1 % — AB (ref 11.5–14.5)
WBC: 7.8 10*3/uL (ref 3.6–11.0)

## 2015-07-08 LAB — TROPONIN I: Troponin I: <0.03 ng/mL (ref <0.031)

## 2015-07-08 LAB — BASIC METABOLIC PANEL
Anion gap: 7 (ref 5–15)
BUN: 13 mg/dL (ref 6–20)
CHLORIDE: 105 mmol/L (ref 101–111)
CO2: 23 mmol/L (ref 22–32)
CREATININE: 0.78 mg/dL (ref 0.44–1.00)
Calcium: 9.3 mg/dL (ref 8.9–10.3)
GFR calc Af Amer: >60 mL/min (ref >60)
GFR calc non Af Amer: >60 mL/min (ref >60)
Glucose, Bld: 119 mg/dL — ABNORMAL HIGH (ref 65–99)
Potassium: 3.9 mmol/L (ref 3.5–5.1)
SODIUM: 135 mmol/L (ref 135–145)

## 2015-07-08 MED ORDER — TRAMADOL HCL 50 MG PO TABS: 50.0000 mg | ORAL_TABLET | Freq: Four times a day (QID) | ORAL | Status: DC | PRN

## 2015-07-08 MED ORDER — AZITHROMYCIN 500 MG PO TABS: 500.0000 mg | ORAL_TABLET | Freq: Every day | ORAL | Status: AC

## 2015-07-08 MED ORDER — NITROGLYCERIN 2 % TD OINT
1.0000 [in_us] | TOPICAL_OINTMENT | Freq: Once | TRANSDERMAL | Status: AC
  Administered 2015-07-08: 1 [in_us] via TOPICAL
  Filled 2015-07-08: qty 1

## 2015-07-08 MED ORDER — ASPIRIN 81 MG PO CHEW
324.0000 mg | CHEWABLE_TABLET | Freq: Once | ORAL | Status: AC
  Administered 2015-07-08: 324 mg via ORAL
  Filled 2015-07-08: qty 4

## 2015-07-08 NOTE — ED Notes (Signed)
PAtient c/o being very tender on R side of sternum

## 2015-07-08 NOTE — Telephone Encounter (Signed)
Patient Name: DINESHA TWIGGS DOB: Sep 03, 1940 Initial Comment Caller states she is is having chest pains. Nurse Assessment Nurse: Marcelline Deist, RN, Lynda Date/Time (Eastern Time): 07/08/2015 9:43:50 AM Confirm and document reason for call. If symptomatic, describe symptoms. You must click the next button to save text entered. ---Caller states she is is having chest pains all over, mostly in the right side. When she moves a certain way, has sharp pain, lasting less than 5 mins. Has been going on for a week, has a cough as well. Not sure if fever. Has the patient traveled out of the country within the last 30 days? ---Not Applicable Does the patient have any new or worsening symptoms? ---Yes Will a triage be completed? ---Yes Related visit to physician within the last 2 weeks? ---No Does the PT have any chronic conditions? (i.e. diabetes, asthma, etc.) ---Yes List chronic conditions. ---on rx for COPD, on BP rx, heart attack 2007 Is this a behavioral health or substance abuse call? ---No Guidelines Guideline Title Affirmed Question Affirmed Notes Chest Pain [1] Intermittent chest pain or "angina" AND [2] increasing in severity or frequency (Exception: pains lasting a few seconds) Final Disposition User Go to ED Now Marcelline Deist, RN, Lynda Comments Caller states the cough came on after her chest pains started. Will have her husband take her to the hospital. Referrals [facility] - ED Disagree/Comply: Comply

## 2015-07-08 NOTE — ED Provider Notes (Addendum)
[facility] Emergency Department Provider Note  ____________________________________________  Time seen: Approximately 2:54 PM  I have reviewed the triage vital signs and the nursing notes.   HISTORY  Chief Complaint Chest Pain    HPI [name] is a 75 y.o. female who is complaining that she has been having intermittent left substernal chest pain off and on for the past week. Patient states that each episode lasts for about 2-3 minutes and feels like there something stabbing her in her chest. Patient does not know of any definite aggravating or alleviating factors. She denies any recent cough or cold symptoms, or nausea, vomiting, diarrhea, or urinary symptoms. Patient states that she has had some mild dyspnea on exertion. Patient has history of MI in 2017 and had to have bypass surgery after that. Patient has not had a stress test in approximately one year. Patient states that she is pain-free at this time and when she has the pain on a scale of 0-10 her about a 4-5.   Past Medical History  Diagnosis Date  . Epigastric pain   . Osteoporosis, unspecified   . Cervicalgia   . Unspecified tinnitus   . Neoplasm of uncertain behavior of skin   . Thoracic aneurysm, ruptured ([place])   . Other chest pain   . Breast screening, unspecified   . Unspecified essential hypertension   . Other and unspecified hyperlipidemia   . Unspecified hypothyroidism   . Osteoarthrosis, unspecified whether generalized or localized, unspecified site   . Chronic airway obstruction, not elsewhere classified   . Peripheral vascular disease ([place])   . Myocardial infarction ([place]) 2007  . Coronary atherosclerosis of unspecified type of vessel, native or graft 2007  . PONV (postoperative nausea and vomiting)   . Shortness of breath   . Blood transfusion   . UTI (lower urinary tract infection)   . Thyroid nodule     seen on CT Scan 07/19/2011  . AAA (abdominal aortic aneurysm) ([place])    . 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 Cancer [facility]) 2015    Bladder  . Breast CA ([place]) 2016    right- radiation    Patient Active Problem List   Diagnosis Date Noted  . History of bladder cancer 05/04/2015  . Advanced directives, counseling/discussion 05/04/2015  . Bilateral upper abdominal pain 02/05/2015  . 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 Vitamin D deficiency 04/28/2014  . Counseling regarding end of life decision making 04/28/2014  . Low back pain radiating to left lower extremity 12/09/2013  . Aftercare following surgery of the circulatory system, [place] 04/02/2013  . BPPV (benign paroxysmal positional vertigo) 01/23/2013  . Atherosclerosis of native arteries of extremity with intermittent claudication ([place]) 03/27/2012  . AAA (abdominal aortic aneurysm) ([place]) 09/06/2011  . Atherosclerotic PVD with intermittent claudication ([place]) 09/06/2011  . Iliac artery stenosis, bilateral ([place]) 05/10/2011  . Moderate COPD (chronic obstructive pulmonary disease) ([place]) 01/15/2009  . Osteoporosis 05/27/2008  . COMMON MIGRAINE 02/05/2008  . ABDOMINAL AORTIC ANEURYSM 08/21/2007  . Hypothyroid 04/15/2007  . Hyperlipidemia 04/15/2007  . Essential hypertension, benign 04/15/2007  . Coronary atherosclerosis 04/15/2007  . OSTEOARTHRITIS 04/15/2007    Past Surgical History  Procedure Laterality Date  . Acute inferior mi  11/2005  . Port wine stain removal w/ laser  2004    bladder cancer  . Goiter removal  1979  . Coronary artery bypass graft  11/2005  . Aorta - bilateral femoral artery bypass graft  07/25/2011    Procedure: AORTA BIFEMORAL BYPASS GRAFT;  Surgeon: Angelia Mould, MD;  Location: [place];  Service: Vascular;  Laterality: Bilateral;  . Abdominal hysterectomy    . Pr vein bypass graft,aorto-fem-pop  July 25, 2011  . Abdominal aortagram N/A 05/15/2011    Procedure: ABDOMINAL Maxcine Ham;  Surgeon: Angelia Mould, MD;  Location[facility] CATH LAB;  Service:  Cardiovascular;  Laterality: N/A;  . Breast lumpectomy Right Feb. 2016  . Breast biopsy Right 2016    positive    Current Outpatient Rx  Name  Route  Sig  Dispense  Refill  . aspirin EC 81 MG tablet   Oral   Take 81 mg by mouth daily.         [name] atenolol (TENORMIN) 25 MG tablet   Oral   Take 1 tablet (25 mg total) by mouth daily.   90 tablet   3   . benazepril (LOTENSIN) 10 MG tablet   Oral   Take 1 tablet by mouth daily.         [name] letrozole (FEMARA) 2.5 MG tablet   Oral   Take 1 tablet (2.5 mg total) by mouth daily.   90 tablet   3   . levothyroxine (SYNTHROID, LEVOTHROID) 25 MCG tablet   Oral   Take 25 mcg by mouth daily.         [name] losartan (COZAAR) 50 MG tablet   Oral   Take 1 tablet (50 mg total) by mouth daily.   90 tablet   3   . simvastatin (ZOCOR) 80 MG tablet   Oral   Take 80 mg by mouth daily.         [name] tiotropium (SPIRIVA) 18 MCG inhalation capsule   Inhalation   Place 18 mcg into inhaler and inhale daily.         [name] azithromycin (ZITHROMAX) 500 MG tablet   Oral   Take 1 tablet (500 mg total) by mouth daily. Take 1 tablet daily for 3 days.   3 tablet   0   . traMADol (ULTRAM) 50 MG tablet   Oral   Take 1 tablet (50 mg total) by mouth every 6 (six) hours as needed.   20 tablet   0     Allergies Iohexol and Prednisone  Family History  Problem Relation Age of Onset  . Alzheimer's disease Father   . Parkinsonism Father   . Heart defect Mother     enlarged heart  . Heart attack Mother   . Hypertension Sister   . Diabetes Sister   . Heart disease Sister   . Hyperlipidemia Sister   . Breast cancer Sister 60  . Hypothyroidism Sister   . Aneurysm Paternal Grandfather   . Breast cancer Daughter 60    Social History Social History  Substance Use Topics  . Smoking status: Former Smoker -- 1 years    Types: Cigarettes    Quit date: 11/24/2005  . Smokeless tobacco: Never Used     Comment: 40 pack years   . Alcohol Use: No     Review of Systems Constitutional: No fever/chills Eyes: No visual changes. ENT: No sore throat. Cardiovascular: Intermittent stabbing chest pain on for 1 week history of coronary artery disease. Respiratory: Denies shortness of breath. But has been having some mild dyspnea on exertion. Gastrointestinal: No abdominal pain.  No nausea, no vomiting.  No diarrhea.  No constipation. Genitourinary: Negative for dysuria. Musculoskeletal: Negative for back pain. Skin:  Negative for rash. Neurological: Negative for headaches, focal weakness or numbness. 10-point ROS otherwise negative.  ____________________________________________   PHYSICAL EXAM:  VITAL SIGNS: ED Triage Vitals  Enc Vitals Group     BP 07/08/15 1042 147/63 mmHg     Pulse Rate 07/08/15 1042 63     Resp 07/08/15 1042 20     Temp 07/08/15 1042 97.8 F (36.6 C)     Temp Source 07/08/15 1042 Oral     SpO2 07/08/15 1042 95 %     Weight 07/08/15 1042 125 lb (56.7 kg)     Height 07/08/15 1042 '5\' 4"'$  (1.626 m)     Head Cir --      Peak Flow --      Pain Score 07/08/15 1043 0     Pain Loc --      Pain Edu? --      Excl. in [place]? --     Constitutional: Alert and oriented. Well appearing and in no acute distress. Patient is pain-free at this time. Eyes: Conjunctivae are normal. PERRL. EOMI. Head: Atraumatic. Nose: No congestion/rhinnorhea. Mouth/Throat: Mucous membranes are moist.  Oropharynx non-erythematous. Neck: No stridor.   Cardiovascular: Normal rate, regular rhythm. Grossly normal heart sounds.  Good peripheral circulation. Respiratory: Normal respiratory effort.  No retractions. Lungs CTAB. No palpable chest wall tenderness. Gastrointestinal: Soft and nontender. No distention. No abdominal bruits. No CVA tenderness. Musculoskeletal: No lower extremity tenderness nor edema.  No joint effusions. Neurologic:  Normal speech and language. No gross focal neurologic deficits are appreciated. No gait instability. Skin:   Skin is warm, dry and intact. No rash noted. Psychiatric: Mood and affect are normal. Speech and behavior are normal.  ____________________________________________   LABS (all labs ordered are listed, but only abnormal results are displayed)  Labs Reviewed  BASIC METABOLIC PANEL - Abnormal; Notable for the following:    Glucose, Bld 119 (*)    All other components within normal limits  CBC - Abnormal; Notable for the following:    RDW 15.1 (*)    All other components within normal limits  TROPONIN I   ____________________________________________  EKG  ED ECG REPORT I, Ruby Cola, the attending physician, personally viewed and interpreted this ECG.   Date: 07/08/2015  EKG Time: 10:41 AM  Rate: 58  Rhythm: normal EKG, normal sinus rhythm, unchanged from previous tracings, sinus bradycardia, poor R-wave progression  Axis normal  Intervals:none, and possible left atrial large but  ST&T Change: Nonspecific ST and T-wave changes  ____________________________________________  RADIOLOGY  Dg Chest 2 View  07/08/2015  CLINICAL DATA:  Right-sided mid chest pain EXAM: CHEST  2 VIEW COMPARISON:  01/31/2015 FINDINGS: The lungs are hyperinflated likely secondary to COPD. There is no focal parenchymal opacity. There is no pleural effusion or pneumothorax. The heart and mediastinal contours are unremarkable. There is evidence of prior CABG. The osseous structures are unremarkable. IMPRESSION: No active cardiopulmonary disease. Electronically Signed   By: Kathreen Devoid   On: 07/08/2015 12:03    ____________________________________________   PROCEDURES  Procedure(s) performed: None  Critical Care performed: No  ____________________________________________   INITIAL IMPRESSION / ASSESSMENT AND PLAN / ED COURSE  Pertinent labs & imaging results that were available during my care of the patient were reviewed by me and considered in my medical decision making (see chart for  details).  4:16 PM Patient was given aspirin and Nitropaste in the emergency department. Patient's chest x-ray showed no acute findings and her initial  cardiac enzymes were all negative. Patient's EKG showed nonspecific findings but no acute EKG changes. Hospitalist will be consulted for admission for chest pain rule out secondary to significance of history of patient's coronary artery disease.  4:16 PM The hospitalist would see the patient and felt that patient's pain was primarily musculoskeletal. Upon reexamination patient had pretty significant tenderness palpation along her right lower mid sternal area along her breast tissue. Patient is stating that that is what exactly is eliciting her pain. Patient has had some cough and congestion over the past week and he felt that this was secondary to that. She has follow up with cardiology and will be seen as an outpatient. ____________________________________________   FINAL CLINICAL IMPRESSION(S) / ED DIAGNOSES  Final diagnoses:  Acute chest pain  Chest wall pain  Acute bronchitis, unspecified organism         Ruby Cola, MD 07/08/15 [place], MD 07/08/15 1616

## 2015-07-08 NOTE — ED Notes (Signed)
Pt to ed with c/o right sided chest pain and center of chest intermittent for the last week.  Pt also reports cough and sob, dizziness with chest pain.

## 2015-07-08 NOTE — Discharge Instructions (Signed)

## 2015-11-11 ENCOUNTER — Other Ambulatory Visit: Payer: Commercial Managed Care - HMO

## 2015-11-11 ENCOUNTER — Encounter: Payer: Self-pay | Admitting: Hematology and Oncology

## 2015-11-11 ENCOUNTER — Ambulatory Visit: Payer: Commercial Managed Care - HMO | Admitting: Radiation Oncology

## 2015-11-11 ENCOUNTER — Inpatient Hospital Stay
Admission: RE | Admit: 2015-11-11 | Payer: Commercial Managed Care - HMO | Source: Ambulatory Visit | Admitting: Radiation Oncology

## 2015-11-11 ENCOUNTER — Ambulatory Visit
Admission: RE | Admit: 2015-11-11 | Discharge: 2015-11-11 | Disposition: A | Discharge status: Home / Self Care | Payer: Commercial Managed Care - HMO | Source: Ambulatory Visit | Attending: Radiation Oncology | Admitting: Radiation Oncology

## 2015-11-11 ENCOUNTER — Inpatient Hospital Stay: Payer: Commercial Managed Care - HMO | Attending: Hematology and Oncology | Admitting: Hematology and Oncology

## 2015-11-11 ENCOUNTER — Encounter: Payer: Self-pay | Admitting: Radiation Oncology

## 2015-11-11 ENCOUNTER — Inpatient Hospital Stay (HOSPITAL_BASED_OUTPATIENT_CLINIC_OR_DEPARTMENT_OTHER): Payer: Commercial Managed Care - HMO

## 2015-11-11 ENCOUNTER — Ambulatory Visit: Payer: Commercial Managed Care - HMO | Admitting: Oncology

## 2015-11-11 ENCOUNTER — Other Ambulatory Visit: Payer: Self-pay | Admitting: *Deleted

## 2015-11-11 VITALS — BP 159/70 | HR 58 | Temp 96.0°F | Ht 65.0 in | Wt 120.5 lb

## 2015-11-11 VITALS — BP 140/66 | HR 56 | Temp 97.5°F | Resp 18 | Ht 65.0 in | Wt 120.6 lb

## 2015-11-11 DIAGNOSIS — I739 Peripheral vascular disease, unspecified: Secondary | ICD-10-CM | POA: Diagnosis not present

## 2015-11-11 DIAGNOSIS — I2510 Atherosclerotic heart disease of native coronary artery without angina pectoris: Secondary | ICD-10-CM | POA: Diagnosis not present

## 2015-11-11 DIAGNOSIS — I252 Old myocardial infarction: Secondary | ICD-10-CM | POA: Diagnosis not present

## 2015-11-11 DIAGNOSIS — Z7982 Long term (current) use of aspirin: Secondary | ICD-10-CM | POA: Insufficient documentation

## 2015-11-11 DIAGNOSIS — Z170 Estrogen receptor positive status [ER+]: Secondary | ICD-10-CM | POA: Insufficient documentation

## 2015-11-11 DIAGNOSIS — I10 Essential (primary) hypertension: Secondary | ICD-10-CM | POA: Insufficient documentation

## 2015-11-11 DIAGNOSIS — J449 Chronic obstructive pulmonary disease, unspecified: Secondary | ICD-10-CM | POA: Insufficient documentation

## 2015-11-11 DIAGNOSIS — I714 Abdominal aortic aneurysm, without rupture: Secondary | ICD-10-CM | POA: Insufficient documentation

## 2015-11-11 DIAGNOSIS — M818 Other osteoporosis without current pathological fracture: Secondary | ICD-10-CM | POA: Insufficient documentation

## 2015-11-11 DIAGNOSIS — Z79811 Long term (current) use of aromatase inhibitors: Secondary | ICD-10-CM | POA: Diagnosis not present

## 2015-11-11 DIAGNOSIS — E039 Hypothyroidism, unspecified: Secondary | ICD-10-CM | POA: Diagnosis not present

## 2015-11-11 DIAGNOSIS — Z87891 Personal history of nicotine dependence: Secondary | ICD-10-CM | POA: Diagnosis not present

## 2015-11-11 DIAGNOSIS — E785 Hyperlipidemia, unspecified: Secondary | ICD-10-CM | POA: Diagnosis not present

## 2015-11-11 DIAGNOSIS — Z923 Personal history of irradiation: Secondary | ICD-10-CM | POA: Diagnosis not present

## 2015-11-11 DIAGNOSIS — C50911 Malignant neoplasm of unspecified site of right female breast: Secondary | ICD-10-CM

## 2015-11-11 DIAGNOSIS — M1990 Unspecified osteoarthritis, unspecified site: Secondary | ICD-10-CM | POA: Insufficient documentation

## 2015-11-11 DIAGNOSIS — M810 Age-related osteoporosis without current pathological fracture: Secondary | ICD-10-CM

## 2015-11-11 DIAGNOSIS — C50919 Malignant neoplasm of unspecified site of unspecified female breast: Secondary | ICD-10-CM

## 2015-11-11 LAB — CBC WITH DIFFERENTIAL/PLATELET
Basophils Absolute: 0.1 10*3/uL (ref 0–0.1)
Basophils Relative: 1 %
Eosinophils Absolute: 0.2 10*3/uL (ref 0–0.7)
Eosinophils Relative: 2 %
HEMATOCRIT: 34.7 % — AB (ref 35.0–47.0)
HEMOGLOBIN: 12 g/dL (ref 12.0–16.0)
LYMPHS ABS: 2.3 10*3/uL (ref 1.0–3.6)
LYMPHS PCT: 24 %
MCH: 30 pg (ref 26.0–34.0)
MCHC: 34.4 g/dL (ref 32.0–36.0)
MCV: 87.3 fL (ref 80.0–100.0)
MONO ABS: 0.8 10*3/uL (ref 0.2–0.9)
MONOS PCT: 9 %
NEUTROS ABS: 6.3 10*3/uL (ref 1.4–6.5)
NEUTROS PCT: 64 %
Platelets: 194 10*3/uL (ref 150–440)
RBC: 3.98 MIL/uL (ref 3.80–5.20)
RDW: 14.8 % — AB (ref 11.5–14.5)
WBC: 9.7 10*3/uL (ref 3.6–11.0)

## 2015-11-11 LAB — COMPREHENSIVE METABOLIC PANEL
ALBUMIN: 4.4 g/dL (ref 3.5–5.0)
ALT: 15 U/L (ref 14–54)
ANION GAP: 5 (ref 5–15)
AST: 21 U/L (ref 15–41)
Alkaline Phosphatase: 81 U/L (ref 38–126)
BILIRUBIN TOTAL: 0.6 mg/dL (ref 0.3–1.2)
BUN: 18 mg/dL (ref 6–20)
CO2: 26 mmol/L (ref 22–32)
Calcium: 9.4 mg/dL (ref 8.9–10.3)
Chloride: 103 mmol/L (ref 101–111)
Creatinine, Ser: 0.83 mg/dL (ref 0.44–1.00)
GFR calc Af Amer: >60 mL/min (ref >60)
GLUCOSE: 93 mg/dL (ref 65–99)
POTASSIUM: 4.4 mmol/L (ref 3.5–5.1)
Sodium: 134 mmol/L — ABNORMAL LOW (ref 135–145)
TOTAL PROTEIN: 7.6 g/dL (ref 6.5–8.1)

## 2015-11-11 NOTE — Progress Notes (Signed)
Radiation Oncology Follow up Note  Name: Amy Stone   Date:   11/11/2015 MRN:  [id number] DOB: [birth year]    This 75 y.o. 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 She is seen today in routine follow-up and is doing well she specifically denies breast tenderness cough or bone pain.. She is currently on aromatase inhibitor therapy. Had a mammogram back in February which was benign with recognition for 1 year follow-up.  COMPLICATIONS OF TREATMENT: none  FOLLOW UP COMPLIANCE: keeps appointments   PHYSICAL EXAM:  BP (!) 159/70   Pulse (!) 58   Temp (!) 96 F (35.6 C)   Ht 5' 5"  (1.651 m)   Wt 120 lb 7.7 oz (54.6 kg)   BMI 20.05 kg/m  Lungs are clear to A&P cardiac examination essentially unremarkable with regular rate and rhythm. No dominant mass or nodularity is noted in either breast in 2 positions examined. Incision is well-healed. No axillary or supraclavicular adenopathy is appreciated. Cosmetic result is excellent. Well-developed well-nourished patient in NAD. HEENT reveals PERLA, EOMI, discs not visualized.  Oral cavity is clear. No oral mucosal lesions are identified. Neck is clear without evidence of cervical or supraclavicular adenopathy. Lungs are clear to A&P. Cardiac examination is essentially unremarkable with regular rate and rhythm without murmur rub or thrill. Abdomen is benign with no organomegaly or masses noted. Motor sensory and DTR levels are equal and symmetric in the upper and lower extremities. Cranial nerves II through XII are grossly intact. Proprioception is intact. No peripheral adenopathy or edema is identified. No motor or sensory  levels are noted. Crude visual fields are within normal range.  RADIOLOGY RESULTS: Mammograms are reviewed compatible above-stated findings  PLAN: Present time she is doing well with no evidence of disease. She continues on aromatase inhibitor therapy. I have asked to see her back in 1 year for follow-up. She or he has follow-up mammograms ordered.  I would like to take this opportunity to thank you for allowing me to participate in the care of your patient.Armstead Peaks., MD

## 2015-11-11 NOTE — Progress Notes (Signed)
Pt here for f/u of breast cancer and had no concerns. She states she is tolerating letrozole without any side effects.  She eats good and bowels work without problems.

## 2015-11-11 NOTE — Progress Notes (Addendum)
[facility] day:  11/11/15  Chief Complaint: Amy Stone is a 75 y.o. female with stage IB right breast cancer who is seen for reassessment.  HPI:  The patient presented with pain/discomfort in her right breast.  She then discovered a breast mass.  She underwent biopsy on 04/30/2014.  Pathology revealed low grade ductal carcinoma.  She underwent right partial mastectomy by Dr. Rochel Brome on 06/02/2014.  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  Tumor was ER positive (> 90%), PR positive (1-10%), and Her2/neu 2+ by IHC, but negative by FISH.  Two sentinel lymph nodes were negative.  Pathologic stage was T1bN0M0.  She received radiation.    She began letrozole after completing radiation.   Mammogram on 06/03/2015 was negative.  Bone density study on 10/08/2014 revealed osteoporosis with a T score of -3.2 in the right femur and -2.4 in L1-L4.  She was on Fosamax every week.  She has a history of non-invasive bladder cancer.  She sees Dr Rogers Blocker yearly for cystoscopy (next appointment 11/2015).  Symptomatically, she is doing well.  She states that the "pill doesn't bother me".  She feels "as good as any 75 year old".   Past Medical History:  Diagnosis Date  . AAA (abdominal aortic aneurysm) ([place])   . Blood transfusion   . Breast CA ([place]) 2016   right- radiation  . Breast screening, unspecified   . Cancer [facility]) 2015   Bladder  . 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 Cervicalgia   . Chronic airway obstruction, not elsewhere classified   . Coronary atherosclerosis of unspecified type of vessel, native or graft 2007  . Epigastric pain   . Myocardial infarction ([place]) 2007  . Neoplasm of uncertain behavior of skin   . Osteoarthrosis, unspecified whether generalized or localized, unspecified site   . Osteoporosis, unspecified   . Other and unspecified hyperlipidemia   . Other chest pain   . Peripheral vascular  disease ([place])   . PONV (postoperative nausea and vomiting)   . Shortness of breath   . Thoracic aneurysm, ruptured ([place])   . Thyroid nodule    seen on CT Scan 07/19/2011  . Unspecified essential hypertension   . Unspecified hypothyroidism   . Unspecified tinnitus   . UTI (lower urinary tract infection)     Past Surgical History:  Procedure Laterality Date  . ABDOMINAL AORTAGRAM N/A 05/15/2011   Procedure: ABDOMINAL Maxcine Ham;  Surgeon: Angelia Mould, MD;  Location: [facility] CATH LAB;  Service: Cardiovascular;  Laterality: N/A;  . ABDOMINAL HYSTERECTOMY    . acute inferior mi  11/2005  . AORTA - BILATERAL FEMORAL ARTERY BYPASS GRAFT  07/25/2011   Procedure: AORTA BIFEMORAL BYPASS GRAFT;  Surgeon: Angelia Mould, MD;  Location: [place];  Service: Vascular;  Laterality: Bilateral;  . BREAST BIOPSY Right 2016   positive  . BREAST LUMPECTOMY Right Feb. 2016  . CORONARY ARTERY BYPASS GRAFT  11/2005  . goiter removal  1979  . PORT WINE STAIN REMOVAL W/ LASER  2004   bladder cancer  . PR VEIN BYPASS GRAFT,AORTO-FEM-POP  July 25, 2011    Family History  Problem Relation Age of Onset  . Alzheimer's disease Father   . Parkinsonism Father   . Heart defect Mother     enlarged heart  . Heart attack Mother   . Hypertension Sister   . Diabetes Sister   . Heart disease  Sister   . Hyperlipidemia Sister   . Breast cancer Sister 30  . Hypothyroidism Sister   . Diabetes Sister   . Aneurysm Paternal Grandfather   . Breast cancer Daughter 60    Social History:  reports that she quit smoking about 9 years ago. Her smoking use included Cigarettes. She quit after 1.00 year of use. She has never used smokeless tobacco. She reports that she does not drink alcohol or use drugs.  She quit smoking on 11/24/2005.  The patient is alone today.  Allergies:  Allergies  Allergen Reactions  . Iohexol      Code: HIVES, Desc: CONTRAST REACTION OF HIVES (LARGE WHELPS DEVELOPED OVER ENTIRE BODY/MMS   .  Prednisone Other (See Comments)    hallucinations    Current Medications: Current Outpatient Prescriptions  Medication Sig Dispense Refill  . aspirin 81 MG tablet Take 81 mg by mouth daily.    [name] atenolol (TENORMIN) 25 MG tablet     . atorvastatin (LIPITOR) 80 MG tablet     . letrozole (FEMARA) 2.5 MG tablet     . levothyroxine (SYNTHROID, LEVOTHROID) 50 MCG tablet     . losartan (COZAAR) 50 MG tablet     . tiotropium (SPIRIVA) 18 MCG inhalation capsule Place 18 mcg into inhaler and inhale daily.     No current facility-administered medications for this visit.     Review of Systems:  GENERAL:  Feels good.  No fevers, sweats or weight loss. PERFORMANCE STATUS (ECOG):  1 HEENT:  No visual changes, runny nose, sore throat, mouth sores or tenderness. Lungs: No shortness of breath or cough.  No hemoptysis. Cardiac:  No chest pain, palpitations, orthopnea, or PND. GI:  No nausea, vomiting, diarrhea, constipation, melena or hematochezia. GU:  No urgency, frequency, dysuria, or hematuria. Musculoskeletal:  No back pain.  No joint pain.  No muscle tenderness. Extremities:  No pain or swelling. Skin:  No rashes or skin changes. Neuro:  No headache, numbness or weakness, balance or coordination issues. Endocrine:  No diabetes, thyroid issues, hot flashes or night sweats. Psych:  No mood changes, depression or anxiety. Pain:  No focal pain. Review of systems:  All other systems reviewed and found to be negative.  Physical Exam: Blood pressure 140/66, pulse (!) 56, temperature 97.5 F (36.4 C), temperature source Oral, resp. rate 18, height 5' 5" (1.651 m), weight 120 lb 9.5 oz (54.7 kg). GENERAL:  Well developed, well nourished, sitting comfortably in the exam room in no acute distress. MENTAL STATUS:  Alert and oriented to person, place and time. HEAD:  Short gray hair.  Normocephalic, atraumatic, face symmetric, no Cushingoid features. EYES:  Glasses.  Blue eyes.  Pupils equal round and  reactive to light and accomodation.  No conjunctivitis or scleral icterus. ENT:  Hearing aide.  Oropharynx clear without lesion.  Dentures. Tongue normal. Mucous membranes moist.  RESPIRATORY:  Clear to auscultation without rales, wheezes or rhonchi. CARDIOVASCULAR:  Regular rate and rhythm without murmur, rub or gallop. BREAST:  Right breast with 3 o'clock incision.  Tender fibrocystic changes at 11-12 o'clock.  No masses, skin changes or nipple discharge.  Left breast with superior fibrocystic changes.  No masses, skin changes or nipple discharge.  ABDOMEN:  Soft, non-tender, with active bowel sounds, and no hepatosplenomegaly.  No masses. SKIN:  No rashes, ulcers or lesions. EXTREMITIES: No edema, no skin discoloration or tenderness.  No palpable cords. LYMPH NODES: No palpable cervical, supraclavicular, axillary or inguinal adenopathy  NEUROLOGICAL: Unremarkable. PSYCH:  Appropriate.  Appointment on 11/11/2015  Component Date Value Ref Range Status  . WBC 11/11/2015 9.7  3.6 - 11.0 K/uL Final  . RBC 11/11/2015 3.98  3.80 - 5.20 MIL/uL Final  . Hemoglobin 11/11/2015 12.0  12.0 - 16.0 g/dL Final  . HCT 11/11/2015 34.7* 35.0 - 47.0 % Final  . MCV 11/11/2015 87.3  80.0 - 100.0 fL Final  . MCH 11/11/2015 30.0  26.0 - 34.0 pg Final  . MCHC 11/11/2015 34.4  32.0 - 36.0 g/dL Final  . RDW 11/11/2015 14.8* 11.5 - 14.5 % Final  . Platelets 11/11/2015 194  150 - 440 K/uL Final  . Neutrophils Relative % 11/11/2015 64  % Final  . Neutro Abs 11/11/2015 6.3  1.4 - 6.5 K/uL Final  . Lymphocytes Relative 11/11/2015 24  % Final  . Lymphs Abs 11/11/2015 2.3  1.0 - 3.6 K/uL Final  . Monocytes Relative 11/11/2015 9  % Final  . Monocytes Absolute 11/11/2015 0.8  0.2 - 0.9 K/uL Final  . Eosinophils Relative 11/11/2015 2  % Final  . Eosinophils Absolute 11/11/2015 0.2  0 - 0.7 K/uL Final  . Basophils Relative 11/11/2015 1  % Final  . Basophils Absolute 11/11/2015 0.1  0 - 0.1 K/uL Final  . Sodium  11/11/2015 134* 135 - 145 mmol/L Final  . Potassium 11/11/2015 4.4  3.5 - 5.1 mmol/L Final  . Chloride 11/11/2015 103  101 - 111 mmol/L Final  . CO2 11/11/2015 26  22 - 32 mmol/L Final  . Glucose, Bld 11/11/2015 93  65 - 99 mg/dL Final  . BUN 11/11/2015 18  6 - 20 mg/dL Final  . Creatinine, Ser 11/11/2015 0.83  0.44 - 1.00 mg/dL Final  . Calcium 11/11/2015 9.4  8.9 - 10.3 mg/dL Final  . Total Protein 11/11/2015 7.6  6.5 - 8.1 g/dL Final  . Albumin 11/11/2015 4.4  3.5 - 5.0 g/dL Final  . AST 11/11/2015 21  15 - 41 U/L Final  . ALT 11/11/2015 15  14 - 54 U/L Final  . Alkaline Phosphatase 11/11/2015 81  38 - 126 U/L Final  . Total Bilirubin 11/11/2015 0.6  0.3 - 1.2 mg/dL Final  . GFR calc non Af Amer 11/11/2015 >60  >60 mL/min Final  . GFR calc Af Amer 11/11/2015 >60  >60 mL/min Final   Comment: (NOTE) The eGFR has been calculated using the CKD EPI equation. This calculation has not been validated in all clinical situations. eGFR's persistently <60 mL/min signify possible Chronic Kidney Disease.   . Anion gap 11/11/2015 5  5 - 15 Final    Assessment:  Amy Stone is a 75 y.o. female with stage IB right breast cancer s/p partial mastectomy on 06/02/2014.  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  Tumor was ER positive (> 90%), PR positive (1-10%), and Her2/neu negative by FISH.  Two sentinel lymph nodes were negative.  Pathologic stage was T1bN0M0.  She received radiation.    She began letrozole after completing radiation.   Mammogram on 06/03/2015 was negative.  Bone density study on 10/08/2014 revealed osteoporosis with a T score of -3.2 in the right femur and -2.4 in L1-L4.  She was briefly on weekly Fosamax.  She is not interested in any shots or pills.  She has a history of non-invasive bladder cancer.  She undergoes yearly cystoscopy (next appointment 11/2015).  Symptomatically, she is doing well.  Exam is unremarkable.  Plan: 1.  Review entire  medical history, diagnosis and management of breast cancer.  Discuss continuation of Femara.  Discuss side effects including bone loss. 2.  Discuss bone density study in 2016.  Discuss osteoporosis and risk of fracture.  Patient adamantly declines any treatment. 3.  Continue Femara. 4.  Discuss calcium and vitamin D. 5.  Obtain radiation summary. 6.  RTC in 4 months for MD assessment and labs (CBC with diff, CMP, CA27.29).  Addendum:  She received 4256 cGy to the right breast from 07/01/2014 - 07/22/2014 and 1440 cGy to the scar from 07/23/2014 - 08/05/2014.   Lequita Asal, MD  11/11/2015, 3:58 PM

## 2015-11-15 ENCOUNTER — Other Ambulatory Visit: Payer: Self-pay | Admitting: *Deleted

## 2015-11-15 DIAGNOSIS — C50911 Malignant neoplasm of unspecified site of right female breast: Secondary | ICD-10-CM

## 2015-12-13 DIAGNOSIS — Z853 Personal history of malignant neoplasm of breast: Secondary | ICD-10-CM | POA: Diagnosis not present

## 2016-01-14 DIAGNOSIS — Z8551 Personal history of malignant neoplasm of bladder: Secondary | ICD-10-CM | POA: Diagnosis not present

## 2016-01-14 DIAGNOSIS — R8299 Other abnormal findings in urine: Secondary | ICD-10-CM | POA: Diagnosis not present

## 2016-01-14 DIAGNOSIS — N393 Stress incontinence (female) (male): Secondary | ICD-10-CM | POA: Diagnosis not present

## 2016-01-27 ENCOUNTER — Telehealth: Payer: Self-pay | Admitting: Cardiovascular Disease

## 2016-01-27 NOTE — Telephone Encounter (Signed)
Patient not interested in seeing Dr. Rockey Situ.  Deleting recall.

## 2016-03-12 NOTE — Progress Notes (Deleted)
Amy Stone:  03/12/16  Chief Complaint: Amy Stone is a 75 y.o. female with stage IB right breast cancer who is seen for 6 month assessment.  HPI:  The patient was last seen in the medical oncology clinic on 11/11/2015.  At that time, she was seen for initial assessment by me.  She was doing well on Femara.  She was briefly on Fosamax for osteoporosis.  She declined any treatment for osteoporosis despite her increased risk of fracture.    Past Medical History:  Diagnosis Date  . AAA (abdominal aortic aneurysm) ([place])   . Blood transfusion   . Breast CA ([place]) 2016   right- radiation  . Breast screening, unspecified   . Cancer [facility]) 2015   Bladder  . 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 Cervicalgia   . Chronic airway obstruction, not elsewhere classified ([place])   . Coronary atherosclerosis of unspecified type of vessel, native or graft 2007  . Epigastric pain   . Myocardial infarction 2007  . Neoplasm of uncertain behavior of skin   . Osteoarthrosis, unspecified whether generalized or localized, unspecified site   . Osteoporosis, unspecified   . Other and unspecified hyperlipidemia   . Other chest pain   . Peripheral vascular disease ([place])   . PONV (postoperative nausea and vomiting)   . Shortness of breath   . Thoracic aneurysm, ruptured ([place])   . Thyroid nodule    seen on CT Scan 07/19/2011  . Unspecified essential hypertension   . Unspecified hypothyroidism   . Unspecified tinnitus   . UTI (lower urinary tract infection)     Past Surgical History:  Procedure Laterality Date  . ABDOMINAL AORTAGRAM N/A 05/15/2011   Procedure: ABDOMINAL Maxcine Ham;  Surgeon: Angelia Mould, MD;  Location: [facility] CATH LAB;  Service: Cardiovascular;  Laterality: N/A;  . ABDOMINAL HYSTERECTOMY    . acute inferior mi  11/2005  . AORTA - BILATERAL FEMORAL ARTERY BYPASS GRAFT  07/25/2011   Procedure: AORTA BIFEMORAL BYPASS GRAFT;   Surgeon: Angelia Mould, MD;  Location: [place];  Service: Vascular;  Laterality: Bilateral;  . BREAST BIOPSY Right 2016   positive  . BREAST LUMPECTOMY Right Feb. 2016  . CORONARY ARTERY BYPASS GRAFT  11/2005  . goiter removal  1979  . PORT WINE STAIN REMOVAL W/ LASER  2004   bladder cancer  . PR VEIN BYPASS GRAFT,AORTO-FEM-POP  July 25, 2011    Family History  Problem Relation Age of Onset  . Alzheimer's disease Father   . Parkinsonism Father   . Heart defect Mother     enlarged heart  . Heart attack Mother   . Hypertension Sister   . Diabetes Sister   . Heart disease Sister   . Hyperlipidemia Sister   . Breast cancer Sister 21  . Hypothyroidism Sister   . Diabetes Sister   . Aneurysm Paternal Grandfather   . Breast cancer Daughter 47    Social History:  reports that she quit smoking about 10 years ago. Her smoking use included Cigarettes. She quit after 1.00 year of use. She has never used smokeless tobacco. She reports that she does not drink alcohol or use drugs.  She quit smoking on 11/24/2005.  The patient is alone today.  Allergies:  Allergies  Allergen Reactions  . Iohexol      Code: HIVES, Desc: CONTRAST REACTION OF HIVES (LARGE WHELPS DEVELOPED OVER ENTIRE BODY/MMS   .  Prednisone Other (See Comments)    hallucinations    Current Medications: Current Outpatient Prescriptions  Medication Sig Dispense Refill  . aspirin 81 MG tablet Take 81 mg by mouth daily.    [name] atenolol (TENORMIN) 25 MG tablet     . atorvastatin (LIPITOR) 80 MG tablet     . letrozole (FEMARA) 2.5 MG tablet     . levothyroxine (SYNTHROID, LEVOTHROID) 50 MCG tablet     . losartan (COZAAR) 50 MG tablet     . tiotropium (SPIRIVA) 18 MCG inhalation capsule Place 18 mcg into inhaler and inhale daily.     No current facility-administered medications for this visit.     Review of Systems:  GENERAL:  Feels good.  No fevers, sweats or weight loss. PERFORMANCE STATUS (ECOG):  1 HEENT:  No  visual changes, runny nose, sore throat, mouth sores or tenderness. Lungs: No shortness of breath or cough.  No hemoptysis. Cardiac:  No chest pain, palpitations, orthopnea, or PND. GI:  No nausea, vomiting, diarrhea, constipation, melena or hematochezia. GU:  No urgency, frequency, dysuria, or hematuria. Musculoskeletal:  No back pain.  No joint pain.  No muscle tenderness. Extremities:  No pain or swelling. Skin:  No rashes or skin changes. Neuro:  No headache, numbness or weakness, balance or coordination issues. Endocrine:  No diabetes, thyroid issues, hot flashes or night sweats. Psych:  No mood changes, depression or anxiety. Pain:  No focal pain. Review of systems:  All other systems reviewed and found to be negative.  Physical Exam: There were no vitals taken for this visit. GENERAL:  Well developed, well nourished, sitting comfortably in the exam room in no acute distress. MENTAL STATUS:  Alert and oriented to person, place and time. HEAD:  Short gray hair.  Normocephalic, atraumatic, face symmetric, no Cushingoid features. EYES:  Glasses.  Blue eyes.  Pupils equal round and reactive to light and accomodation.  No conjunctivitis or scleral icterus. ENT:  Hearing aide.  Oropharynx clear without lesion.  Dentures. Tongue normal. Mucous membranes moist.  RESPIRATORY:  Clear to auscultation without rales, wheezes or rhonchi. CARDIOVASCULAR:  Regular rate and rhythm without murmur, rub or gallop. BREAST:  Right breast with 3 o'clock incision.  Tender fibrocystic changes at 11-12 o'clock.  No masses, skin changes or nipple discharge.  Left breast with superior fibrocystic changes.  No masses, skin changes or nipple discharge.  ABDOMEN:  Soft, non-tender, with active bowel sounds, and no hepatosplenomegaly.  No masses. SKIN:  No rashes, ulcers or lesions. EXTREMITIES: No edema, no skin discoloration or tenderness.  No palpable cords. LYMPH NODES: No palpable cervical, supraclavicular,  axillary or inguinal adenopathy  NEUROLOGICAL: Unremarkable. PSYCH:  Appropriate.  No visits with results within 3 Stone(s) from this visit.  Latest known visit with results is:  Appointment on 11/11/2015  Component Date Value Ref Range Status  . WBC 11/11/2015 9.7  3.6 - 11.0 K/uL Final  . RBC 11/11/2015 3.98  3.80 - 5.20 MIL/uL Final  . Hemoglobin 11/11/2015 12.0  12.0 - 16.0 g/dL Final  . HCT 11/11/2015 34.7* 35.0 - 47.0 % Final  . MCV 11/11/2015 87.3  80.0 - 100.0 fL Final  . MCH 11/11/2015 30.0  26.0 - 34.0 pg Final  . MCHC 11/11/2015 34.4  32.0 - 36.0 g/dL Final  . RDW 11/11/2015 14.8* 11.5 - 14.5 % Final  . Platelets 11/11/2015 194  150 - 440 K/uL Final  . Neutrophils Relative % 11/11/2015 64  % Final  .  Neutro Abs 11/11/2015 6.3  1.4 - 6.5 K/uL Final  . Lymphocytes Relative 11/11/2015 24  % Final  . Lymphs Abs 11/11/2015 2.3  1.0 - 3.6 K/uL Final  . Monocytes Relative 11/11/2015 9  % Final  . Monocytes Absolute 11/11/2015 0.8  0.2 - 0.9 K/uL Final  . Eosinophils Relative 11/11/2015 2  % Final  . Eosinophils Absolute 11/11/2015 0.2  0 - 0.7 K/uL Final  . Basophils Relative 11/11/2015 1  % Final  . Basophils Absolute 11/11/2015 0.1  0 - 0.1 K/uL Final  . Sodium 11/11/2015 134* 135 - 145 mmol/L Final  . Potassium 11/11/2015 4.4  3.5 - 5.1 mmol/L Final  . Chloride 11/11/2015 103  101 - 111 mmol/L Final  . CO2 11/11/2015 26  22 - 32 mmol/L Final  . Glucose, Bld 11/11/2015 93  65 - 99 mg/dL Final  . BUN 11/11/2015 18  6 - 20 mg/dL Final  . Creatinine, Ser 11/11/2015 0.83  0.44 - 1.00 mg/dL Final  . Calcium 11/11/2015 9.4  8.9 - 10.3 mg/dL Final  . Total Protein 11/11/2015 7.6  6.5 - 8.1 g/dL Final  . Albumin 11/11/2015 4.4  3.5 - 5.0 g/dL Final  . AST 11/11/2015 21  15 - 41 U/L Final  . ALT 11/11/2015 15  14 - 54 U/L Final  . Alkaline Phosphatase 11/11/2015 81  38 - 126 U/L Final  . Total Bilirubin 11/11/2015 0.6  0.3 - 1.2 mg/dL Final  . GFR calc non Af Amer 11/11/2015 >60   >60 mL/min Final  . GFR calc Af Amer 11/11/2015 >60  >60 mL/min Final   Comment: (NOTE) The eGFR has been calculated using the CKD EPI equation. This calculation has not been validated in all clinical situations. eGFR's persistently <60 mL/min signify possible Chronic Kidney Disease.   . Anion gap 11/11/2015 5  5 - 15 Final    Assessment:  NILAYA BOUIE is a 75 y.o. female with stage IB right breast cancer s/p partial mastectomy on 06/02/2014.  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  Tumor was ER positive (> 90%), PR positive (1-10%), and Her2/neu negative by FISH.  Two sentinel lymph nodes were negative.  Pathologic stage was T1bN0M0.  She received 4256 cGy to the right breast from 07/01/2014 - 07/22/2014 and 1440 cGy to the scar from 07/23/2014 - 08/05/2014.    She began Femara after completing radiation.     Mammogram on 06/03/2015 was negative.  Bone density study on 10/08/2014 revealed osteoporosis with a T score of -3.2 in the right femur and -2.4 in L1-L4.  She was briefly on weekly Fosamax.  She is not interested in any shots or pills.  She has a history of non-invasive bladder cancer.  She undergoes yearly cystoscopy (next appointment 11/2015).  Symptomatically, she is doing well.  Exam is unremarkable.  Plan: 1.  Labs today:  CBC with diff, CMP, CA27.29.  Review entire medical history, diagnosis and management of breast cancer.  Discuss continuation of Femara.  Discuss side effects including bone loss. 2.  Discuss bone density study in 2016.  Discuss osteoporosis and risk of fracture.  Patient adamantly declines any treatment. 3.  Continue Femara. 4.  Discuss calcium and vitamin D. 6.  RTC in 4 months for MD assessment and labs (CBC with diff, CMP, CA27.29).   Lequita Asal, MD  03/12/2016, 5:48 PM

## 2016-03-13 ENCOUNTER — Inpatient Hospital Stay: Payer: Medicare HMO | Admitting: Hematology and Oncology

## 2016-03-13 ENCOUNTER — Inpatient Hospital Stay: Payer: Medicare HMO

## 2016-03-24 ENCOUNTER — Encounter: Payer: Self-pay | Admitting: Hematology and Oncology

## 2016-03-24 ENCOUNTER — Inpatient Hospital Stay (HOSPITAL_BASED_OUTPATIENT_CLINIC_OR_DEPARTMENT_OTHER): Payer: Commercial Managed Care - HMO | Admitting: Hematology and Oncology

## 2016-03-24 ENCOUNTER — Inpatient Hospital Stay: Payer: Commercial Managed Care - HMO | Attending: Hematology and Oncology

## 2016-03-24 VITALS — BP 132/65 | HR 51 | Temp 95.8°F | Resp 18 | Wt 121.6 lb

## 2016-03-24 DIAGNOSIS — Z79811 Long term (current) use of aromatase inhibitors: Secondary | ICD-10-CM

## 2016-03-24 DIAGNOSIS — Z87891 Personal history of nicotine dependence: Secondary | ICD-10-CM | POA: Diagnosis not present

## 2016-03-24 DIAGNOSIS — Z951 Presence of aortocoronary bypass graft: Secondary | ICD-10-CM | POA: Insufficient documentation

## 2016-03-24 DIAGNOSIS — Z7982 Long term (current) use of aspirin: Secondary | ICD-10-CM | POA: Insufficient documentation

## 2016-03-24 DIAGNOSIS — I252 Old myocardial infarction: Secondary | ICD-10-CM | POA: Diagnosis not present

## 2016-03-24 DIAGNOSIS — I10 Essential (primary) hypertension: Secondary | ICD-10-CM | POA: Insufficient documentation

## 2016-03-24 DIAGNOSIS — Z170 Estrogen receptor positive status [ER+]: Secondary | ICD-10-CM | POA: Insufficient documentation

## 2016-03-24 DIAGNOSIS — M1990 Unspecified osteoarthritis, unspecified site: Secondary | ICD-10-CM | POA: Insufficient documentation

## 2016-03-24 DIAGNOSIS — J449 Chronic obstructive pulmonary disease, unspecified: Secondary | ICD-10-CM | POA: Insufficient documentation

## 2016-03-24 DIAGNOSIS — E785 Hyperlipidemia, unspecified: Secondary | ICD-10-CM | POA: Insufficient documentation

## 2016-03-24 DIAGNOSIS — I2510 Atherosclerotic heart disease of native coronary artery without angina pectoris: Secondary | ICD-10-CM | POA: Insufficient documentation

## 2016-03-24 DIAGNOSIS — C50911 Malignant neoplasm of unspecified site of right female breast: Secondary | ICD-10-CM | POA: Diagnosis not present

## 2016-03-24 DIAGNOSIS — I739 Peripheral vascular disease, unspecified: Secondary | ICD-10-CM | POA: Insufficient documentation

## 2016-03-24 DIAGNOSIS — I714 Abdominal aortic aneurysm, without rupture: Secondary | ICD-10-CM | POA: Diagnosis not present

## 2016-03-24 DIAGNOSIS — E039 Hypothyroidism, unspecified: Secondary | ICD-10-CM | POA: Insufficient documentation

## 2016-03-24 LAB — CBC WITH DIFFERENTIAL/PLATELET
Basophils Absolute: 0.1 10*3/uL (ref 0–0.1)
Basophils Relative: 1 %
Eosinophils Absolute: 0.2 10*3/uL (ref 0–0.7)
Eosinophils Relative: 2 %
HCT: 35.7 % (ref 35.0–47.0)
Hemoglobin: 12.1 g/dL (ref 12.0–16.0)
Lymphocytes Relative: 22 %
Lymphs Abs: 2.2 10*3/uL (ref 1.0–3.6)
MCH: 30 pg (ref 26.0–34.0)
MCHC: 34 g/dL (ref 32.0–36.0)
MCV: 88.2 fL (ref 80.0–100.0)
Monocytes Absolute: 0.8 10*3/uL (ref 0.2–0.9)
Monocytes Relative: 8 %
Neutro Abs: 6.4 10*3/uL (ref 1.4–6.5)
Neutrophils Relative %: 67 %
Platelets: 193 10*3/uL (ref 150–440)
RBC: 4.05 MIL/uL (ref 3.80–5.20)
RDW: 15.1 % — ABNORMAL HIGH (ref 11.5–14.5)
WBC: 9.6 10*3/uL (ref 3.6–11.0)

## 2016-03-24 LAB — COMPREHENSIVE METABOLIC PANEL
ALT: 15 U/L (ref 14–54)
AST: 21 U/L (ref 15–41)
Albumin: 4.4 g/dL (ref 3.5–5.0)
Alkaline Phosphatase: 73 U/L (ref 38–126)
Anion gap: 5 (ref 5–15)
BUN: 17 mg/dL (ref 6–20)
CO2: 26 mmol/L (ref 22–32)
Calcium: 9.1 mg/dL (ref 8.9–10.3)
Chloride: 101 mmol/L (ref 101–111)
Creatinine, Ser: 0.79 mg/dL (ref 0.44–1.00)
GFR calc Af Amer: >60 mL/min (ref >60)
GFR calc non Af Amer: >60 mL/min (ref >60)
Glucose, Bld: 127 mg/dL — ABNORMAL HIGH (ref 65–99)
Potassium: 4.1 mmol/L (ref 3.5–5.1)
Sodium: 132 mmol/L — ABNORMAL LOW (ref 135–145)
Total Bilirubin: 0.4 mg/dL (ref 0.3–1.2)
Total Protein: 7.5 g/dL (ref 6.5–8.1)

## 2016-03-24 NOTE — Progress Notes (Signed)
Amy Stone day:  03/24/16  Chief Complaint: Amy Stone is a 75 y.o. female with stage IB right breast cancer who is seen for 4 month assessment.  HPI:  The patient was last seen in the medical oncology [facility] on 11/11/2015.   At that time, she was seen for initial assessment by me.  She was doing well.   Exam was unremarkable.  During the interim, she has done well.  She notes some intermittent right breast pain.  She denies any concerns.   Past Medical History:  Diagnosis Date  . AAA (abdominal aortic aneurysm) ([place])   . Blood transfusion   . Breast CA ([place]) 2016   right- radiation  . Breast screening, unspecified   . Cancer Amy Stone) 2015   Bladder  . 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 Cervicalgia   . Chronic airway obstruction, not elsewhere classified   . Coronary atherosclerosis of unspecified type of vessel, native or graft 2007  . Epigastric pain   . Myocardial infarction 2007  . Neoplasm of uncertain behavior of skin   . Osteoarthrosis, unspecified whether generalized or localized, unspecified site   . Osteoporosis, unspecified   . Other and unspecified hyperlipidemia   . Other chest pain   . Peripheral vascular disease ([place])   . PONV (postoperative nausea and vomiting)   . Shortness of breath   . Thoracic aneurysm, ruptured (Amy Stone)   . Thyroid nodule    seen on CT Scan 07/19/2011  . Unspecified essential hypertension   . Unspecified hypothyroidism   . Unspecified tinnitus   . UTI (lower urinary tract infection)     Past Surgical History:  Procedure Laterality Date  . ABDOMINAL AORTAGRAM N/A 05/15/2011   Procedure: ABDOMINAL Amy Stone;  Surgeon: Angelia Mould, MD;  Location: [facility] CATH LAB;  Service: Cardiovascular;  Laterality: N/A;  . ABDOMINAL HYSTERECTOMY    . acute inferior mi  11/2005  . AORTA - BILATERAL FEMORAL ARTERY BYPASS GRAFT  07/25/2011   Procedure: AORTA BIFEMORAL BYPASS GRAFT;  Surgeon:  Angelia Mould, MD;  Location: [place];  Service: Vascular;  Laterality: Bilateral;  . BREAST BIOPSY Right 2016   positive  . BREAST LUMPECTOMY Right Feb. 2016  . CORONARY ARTERY BYPASS GRAFT  11/2005  . goiter removal  1979  . PORT WINE STAIN REMOVAL W/ LASER  2004   bladder cancer  . PR VEIN BYPASS GRAFT,AORTO-FEM-POP  July 25, 2011    Family History  Problem Relation Age of Onset  . Alzheimer's disease Father   . Parkinsonism Father   . Heart defect Mother     enlarged heart  . Heart attack Mother   . Hypertension Sister   . Diabetes Sister   . Heart disease Sister   . Hyperlipidemia Sister   . Breast cancer Sister 23  . Hypothyroidism Sister   . Diabetes Sister   . Aneurysm Paternal Grandfather   . Breast cancer Daughter 62    Social History:  reports that she quit smoking about 10 years ago. Her smoking use included Cigarettes. She quit after 1.00 year of use. She has never used smokeless tobacco. She reports that she does not drink alcohol or use drugs.  She quit smoking on 11/24/2005.  The patient is alone today.  Allergies:  Allergies  Allergen Reactions  . Iohexol      Code: HIVES, Desc: CONTRAST REACTION OF HIVES (LARGE WHELPS DEVELOPED OVER ENTIRE BODY/MMS   .  Prednisone Other (See Comments)    hallucinations    Current Medications: Current Outpatient Prescriptions  Medication Sig Dispense Refill  . aspirin 81 MG tablet Take 81 mg by mouth daily.    [name] atenolol (TENORMIN) 25 MG tablet     . atorvastatin (LIPITOR) 80 MG tablet     . letrozole (FEMARA) 2.5 MG tablet     . levothyroxine (SYNTHROID, LEVOTHROID) 50 MCG tablet     . losartan (COZAAR) 50 MG tablet     . tiotropium (SPIRIVA) 18 MCG inhalation capsule Place 18 mcg into inhaler and inhale daily.     No current facility-administered medications for this visit.     Review of Systems:  GENERAL:  Feels good.  No fevers or sweats.  Weight up 1 pound. PERFORMANCE STATUS (ECOG):  1 HEENT:  No  visual changes, runny nose, sore throat, mouth sores or tenderness. Lungs: No shortness of breath or cough.  No hemoptysis. Cardiac:  No chest pain, palpitations, orthopnea, or PND. GI:  No nausea, vomiting, diarrhea, constipation, melena or hematochezia.  Cut down on salt. GU:  No urgency, frequency, dysuria, or hematuria. Musculoskeletal:  No back pain.  No joint pain.  No muscle tenderness. Extremities:  No pain or swelling. Skin:  No rashes or skin changes. Neuro:  No headache, numbness or weakness, balance or coordination issues. Endocrine:  No diabetes, thyroid issues, hot flashes or night sweats. Psych:  No mood changes, depression or anxiety. Pain:  No focal pain. Review of systems:  All other systems reviewed and found to be negative.  Physical Exam: Blood pressure 132/65, pulse (!) 51, temperature (!) 95.8 F (35.4 C), temperature source Tympanic, resp. rate 18, weight 121 lb 9.3 oz (55.2 kg). GENERAL:  Well developed, well nourished, sitting comfortably in the exam room in no acute distress. MENTAL STATUS:  Alert and oriented to person, place and time. HEAD:  Short gray hair.  Normocephalic, atraumatic, face symmetric, no Cushingoid features. EYES:  Glasses.  Brown/hazel eyes.  Pupils equal round and reactive to light and accomodation.  No conjunctivitis or scleral icterus. ENT:  Hearing aide.  Oropharynx clear without lesion.  Dentures. Tongue normal. Mucous membranes moist.  RESPIRATORY:  Clear to auscultation without rales, wheezes or rhonchi. CARDIOVASCULAR:  Regular rate and rhythm without murmur, rub or gallop. CHEST:  Median sternotomy. BREAST:  Right breast with 3 o'clock incision.  Tender fibrocystic changes medially and inferiorly.  No masses, skin changes or nipple discharge.  Left breast with fibrocystic changes superiorly.  No masses, skin changes or nipple discharge.  ABDOMEN:  Soft, non-tender, with active bowel sounds, and no hepatosplenomegaly.  No masses. SKIN:   No rashes, ulcers or lesions. EXTREMITIES: No edema, no skin discoloration or tenderness.  No palpable cords. LYMPH NODES: No palpable cervical, supraclavicular, axillary or inguinal adenopathy  NEUROLOGICAL: Unremarkable. PSYCH:  Appropriate.   Appointment on 03/24/2016  Component Date Value Ref Range Status  . Sodium 03/24/2016 132* 135 - 145 mmol/L Final  . Potassium 03/24/2016 4.1  3.5 - 5.1 mmol/L Final  . Chloride 03/24/2016 101  101 - 111 mmol/L Final  . CO2 03/24/2016 26  22 - 32 mmol/L Final  . Glucose, Bld 03/24/2016 127* 65 - 99 mg/dL Final  . BUN 03/24/2016 17  6 - 20 mg/dL Final  . Creatinine, Ser 03/24/2016 0.79  0.44 - 1.00 mg/dL Final  . Calcium 03/24/2016 9.1  8.9 - 10.3 mg/dL Final  . Total Protein 03/24/2016 7.5  6.5 -  8.1 g/dL Final  . Albumin 03/24/2016 4.4  3.5 - 5.0 g/dL Final  . AST 03/24/2016 21  15 - 41 U/L Final  . ALT 03/24/2016 15  14 - 54 U/L Final  . Alkaline Phosphatase 03/24/2016 73  38 - 126 U/L Final  . Total Bilirubin 03/24/2016 0.4  0.3 - 1.2 mg/dL Final  . GFR calc non Af Amer 03/24/2016 >60  >60 mL/min Final  . GFR calc Af Amer 03/24/2016 >60  >60 mL/min Final   Comment: (NOTE) The eGFR has been calculated using the CKD EPI equation. This calculation has not been validated in all clinical situations. eGFR's persistently <60 mL/min signify possible Chronic Kidney Disease.   . Anion gap 03/24/2016 5  5 - 15 Final  . WBC 03/24/2016 9.6  3.6 - 11.0 K/uL Final  . RBC 03/24/2016 4.05  3.80 - 5.20 MIL/uL Final  . Hemoglobin 03/24/2016 12.1  12.0 - 16.0 g/dL Final  . HCT 03/24/2016 35.7  35.0 - 47.0 % Final  . MCV 03/24/2016 88.2  80.0 - 100.0 fL Final  . MCH 03/24/2016 30.0  26.0 - 34.0 pg Final  . MCHC 03/24/2016 34.0  32.0 - 36.0 g/dL Final  . RDW 03/24/2016 15.1* 11.5 - 14.5 % Final  . Platelets 03/24/2016 193  150 - 440 K/uL Final  . Neutrophils Relative % 03/24/2016 67  % Final  . Neutro Abs 03/24/2016 6.4  1.4 - 6.5 K/uL Final  .  Lymphocytes Relative 03/24/2016 22  % Final  . Lymphs Abs 03/24/2016 2.2  1.0 - 3.6 K/uL Final  . Monocytes Relative 03/24/2016 8  % Final  . Monocytes Absolute 03/24/2016 0.8  0.2 - 0.9 K/uL Final  . Eosinophils Relative 03/24/2016 2  % Final  . Eosinophils Absolute 03/24/2016 0.2  0 - 0.7 K/uL Final  . Basophils Relative 03/24/2016 1  % Final  . Basophils Absolute 03/24/2016 0.1  0 - 0.1 K/uL Final    Assessment:  Amy Stone is a 75 y.o. female with stage IB right breast cancer s/p partial mastectomy on 06/02/2014.  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  Tumor was ER positive (> 90%), PR positive (1-10%), and Her2/neu negative by FISH.  Two sentinel lymph nodes were negative.  Pathologic stage was T1bN0M0.  She received 4256 cGy to the right breast from 07/01/2014 - 07/22/2014 and 1440 cGy to the scar from 07/23/2014 - 08/05/2014.    She began Femara after completing radiation.   Mammogram on 06/03/2015 was negative.  Bone density study on 10/08/2014 revealed osteoporosis with a T score of -3.2 in the right femur and -2.4 in L1-L4.  She was briefly on weekly Fosamax.  She is not interested in any further treatment.  She has a history of non-invasive bladder cancer.  She undergoes yearly cystoscopy (next appointment 11/2015).  Symptomatically, she is doing well.  Exam reveals tender fibrocystic changes.  Plan: 1.  Labs today:  CBC with diff, CMP, CA27.29. 2.  Continue Femara. 3.  Discuss calcium and vitamin D. 4.  Follow-up mammogram scheduled for 05/25/2016. 5.  RTC in 4 months for MD assessment and labs (CBC with diff, CMP, CA27.29).   Lequita Asal, MD  03/24/2016, 1:48 PM

## 2016-03-24 NOTE — Progress Notes (Signed)
Pt relates no new dx since last visit. C/o today of r breast area pain.-intermittent.

## 2016-03-25 LAB — CANCER ANTIGEN 27.29: CA 27.29: 8.5 U/mL (ref 0.0–38.6)

## 2016-03-27 ENCOUNTER — Other Ambulatory Visit: Payer: Self-pay | Admitting: Family Medicine

## 2016-03-28 ENCOUNTER — Telehealth: Payer: Self-pay | Admitting: *Deleted

## 2016-03-28 NOTE — Telephone Encounter (Signed)
Received fax from [facility] stating that their warehouse has changed the manufacturer on the levothyroxine 50 mcg.  Ask if it is okay to dispense a new brand.  Okay  to change brand per Dr. Diona Browner.  [facility] notified via fax.

## 2016-03-30 ENCOUNTER — Encounter: Payer: Self-pay | Admitting: Family

## 2016-04-06 ENCOUNTER — Other Ambulatory Visit: Payer: Self-pay

## 2016-04-06 ENCOUNTER — Other Ambulatory Visit: Payer: Self-pay | Admitting: *Deleted

## 2016-04-06 DIAGNOSIS — Z95828 Presence of other vascular implants and grafts: Secondary | ICD-10-CM

## 2016-04-07 ENCOUNTER — Other Ambulatory Visit: Payer: Self-pay

## 2016-04-07 ENCOUNTER — Encounter: Payer: Self-pay | Admitting: Family

## 2016-04-07 ENCOUNTER — Ambulatory Visit (HOSPITAL_COMMUNITY)
Admission: RE | Admit: 2016-04-07 | Discharge: 2016-04-07 | Disposition: A | Discharge status: Home / Self Care | Payer: Commercial Managed Care - HMO | Source: Ambulatory Visit | Attending: Family | Admitting: Family

## 2016-04-07 ENCOUNTER — Ambulatory Visit (INDEPENDENT_AMBULATORY_CARE_PROVIDER_SITE_OTHER): Payer: Commercial Managed Care - HMO | Admitting: Family

## 2016-04-07 VITALS — BP 120/70 | HR 58 | Temp 97.5°F | Resp 16 | Ht 66.0 in | Wt 120.0 lb

## 2016-04-07 DIAGNOSIS — I70219 Atherosclerosis of native arteries of extremities with intermittent claudication, unspecified extremity: Secondary | ICD-10-CM

## 2016-04-07 DIAGNOSIS — Z95828 Presence of other vascular implants and grafts: Secondary | ICD-10-CM

## 2016-04-07 DIAGNOSIS — Z87891 Personal history of nicotine dependence: Secondary | ICD-10-CM | POA: Diagnosis not present

## 2016-04-07 DIAGNOSIS — Z48812 Encounter for surgical aftercare following surgery on the circulatory system: Secondary | ICD-10-CM | POA: Diagnosis not present

## 2016-04-07 DIAGNOSIS — I7409 Other arterial embolism and thrombosis of abdominal aorta: Secondary | ICD-10-CM | POA: Diagnosis not present

## 2016-04-07 DIAGNOSIS — Z9889 Other specified postprocedural states: Secondary | ICD-10-CM

## 2016-04-07 NOTE — Patient Instructions (Signed)

## 2016-04-07 NOTE — Progress Notes (Signed)
[facility] [place]   CC: Follow up peripheral artery occlusive disease  History of Present Illness Amy Stone is a 75 y.o. female patient of Dr. Scot Dock who is status post AAA and aortic bifemoral bypass graft April 2013 for significant aortoiliac occlusive disease which was addressed at the time of her aneurysm repair with aortobifemoral bypass grafting. She returns today for PAD follow up, she denies any claudication symptoms with walking any distance. Her legs do hurt with walking on concrete floor only. She denies non-healing wounds.  She denies any stroke or TIA history. She does flexibility and strengthening exercises indoors in the cold months and is active outdoors in the warm months.   She did have a 4 vessel CABG in 2007 and an MI, she stopped smoking then.  She had right breast cancer, had partial mastectomy and radiation tx. She had stage IB right breast cancer s/p partial mastectomy on 06/02/2014.  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  Tumor was ER positive (> 90%), PR positive (1-10%), and Her2/neu negative by FISH.  Two sentinel lymph nodes were negative.  Pathologic stage was T1bN0M0  Pt Diabetic: No Pt smoker: former smoker, quit in 2007  Pt meds include: Statin :Yes Betablocker: Yes ASA: Yes Other anticoagulants/antiplatelets: no    Past Medical History:  Diagnosis Date  . AAA (abdominal aortic aneurysm) ([place])   . Blood transfusion   . Breast CA ([place]) 2016   right- radiation  . Breast screening, unspecified   . Cancer [facility]) 2015   Bladder  . 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 Cervicalgia   . Chronic airway obstruction, not elsewhere classified   . Coronary atherosclerosis of unspecified type of vessel, native or graft 2007  . Epigastric pain   . Myocardial infarction 2007  . Neoplasm of uncertain behavior of skin   . Osteoarthrosis, unspecified whether generalized or localized,  unspecified site   . Osteoporosis, unspecified   . Other and unspecified hyperlipidemia   . Other chest pain   . Peripheral vascular disease ([place])   . PONV (postoperative nausea and vomiting)   . Shortness of breath   . Thoracic aneurysm, ruptured ([place])   . Thyroid nodule    seen on CT Scan 07/19/2011  . Unspecified essential hypertension   . Unspecified hypothyroidism   . Unspecified tinnitus   . UTI (lower urinary tract infection)     Social History Social History  Substance Use Topics  . Smoking status: Former Smoker    Years: 1.00    Types: Cigarettes    Quit date: 11/24/2005  . Smokeless tobacco: Never Used     Comment: 40 pack years   . Alcohol use No    Family History Family History  Problem Relation Age of Onset  . Alzheimer's disease Father   . Parkinsonism Father   . Heart defect Mother     enlarged heart  . Heart attack Mother   . Hypertension Sister   . Diabetes Sister   . Heart disease Sister   . Hyperlipidemia Sister   . Breast cancer Sister 29  . Hypothyroidism Sister   . Diabetes Sister   . Aneurysm Paternal Grandfather   . Breast cancer Daughter 32    Past Surgical History:  Procedure Laterality Date  . ABDOMINAL AORTAGRAM N/A 05/15/2011   Procedure: ABDOMINAL Maxcine Ham;  Surgeon: Angelia Mould, MD;  Location: [facility] CATH LAB;  Service: Cardiovascular;  Laterality: N/A;  .  ABDOMINAL HYSTERECTOMY    . acute inferior mi  11/2005  . AORTA - BILATERAL FEMORAL ARTERY BYPASS GRAFT  07/25/2011   Procedure: AORTA BIFEMORAL BYPASS GRAFT;  Surgeon: Angelia Mould, MD;  Location: [place];  Service: Vascular;  Laterality: Bilateral;  . BREAST BIOPSY Right 2016   positive  . BREAST LUMPECTOMY Right Feb. 2016  . CORONARY ARTERY BYPASS GRAFT  11/2005  . goiter removal  1979  . PORT WINE STAIN REMOVAL W/ LASER  2004   bladder cancer  . PR VEIN BYPASS GRAFT,AORTO-FEM-POP  July 25, 2011    Allergies  Allergen Reactions  . Iohexol      Code: HIVES,  Desc: CONTRAST REACTION OF HIVES (LARGE WHELPS DEVELOPED OVER ENTIRE BODY/MMS   . Prednisone Other (See Comments)    hallucinations    Current Outpatient Prescriptions  Medication Sig Dispense Refill  . aspirin 81 MG tablet Take 81 mg by mouth daily.    [name] atenolol (TENORMIN) 25 MG tablet     . atorvastatin (LIPITOR) 80 MG tablet     . letrozole (FEMARA) 2.5 MG tablet     . levothyroxine (SYNTHROID, LEVOTHROID) 50 MCG tablet TAKE ONE-HALF TABLET BY MOUTH ONCE DAILY 45 tablet 0  . losartan (COZAAR) 50 MG tablet     . tiotropium (SPIRIVA) 18 MCG inhalation capsule Place 18 mcg into inhaler and inhale daily.     No current facility-administered medications for this visit.     ROS: See HPI for pertinent positives and negatives.   Physical Examination  Vitals:   04/07/16 1132  BP: 120/70  Pulse: (!) 58  Resp: 16  Temp: 97.5 F (36.4 C)  TempSrc: Oral  SpO2: 97%  Weight: 120 lb (54.4 kg)  Height: 5' 6"  (1.676 m)   Body mass index is 19.37 kg/m.  General: A&O x 3, WDWN, thin but fit appearing female Gait: normal Eyes: PERRLA Pulmonary: Respirations are non labored, CTAB, without wheezes, rales or rhonchi Cardiac: regular rhythm, no detected murmur     Carotid Bruits Left Right   Negative Negative  Aorta: is faintly palpable Radial pulses: are 2+ and =   VASCULAR EXAM: Extremities without ischemic changes  without Gangrene; without open wounds.     LE Pulses LEFT RIGHT   FEMORAL not palpable 2+ palpable    POPLITEAL not palpable  2+ palpable   POSTERIOR TIBIAL not palpable  not palpable    DORSALIS PEDIS  ANTERIOR TIBIAL 2+ palpable  2+ palpable    Abdomen: soft, NT, no palpable masses. Skin: no rashes, no  ulcers. Musculoskeletal: no muscle wasting or atrophy. Neurologic: A&O X 3; Appropriate Affect ; SENSATION: normal; MOTOR FUNCTION: moving all extremities equally, motor strength 5/5 throughout. Speech is fluent/normal.  CN 2-12 intact.     ASSESSMENT: Amy Stone is a 75 y.o. female who is status post aortic bifemoral bypass graft in April 2013. She has no claudication symptoms, no signs of ischemia in her feet/legs, no back or abdominal pain.   DATA 04/08/14  lower arterial Duplex demonstrated patent distal limbs of aorto-bi-femoral bypass graft, no hemodynamically significant plaque or thrombus visualized at limited imaged segments.  ABI;s today: Right: 1.24 (1.17, 04-14-15), biphasic waveforms; TBI: 0.74 (0.80, 04-14-15) Left: 1.11 (1.18 DP), monophasic PT, biphasic DP; TBI: 0.66 (0.70) Decline in waveforms bilaterally which were all triphasic on 04-14-15, mild decline in bilateral TBI's. Bilateral ABI's remain in the normal range.     PLAN:  Based on the patient's vascular  studies and examination, pt will return to clinic in 1 year with ABI's. I advised her to notify us should she develop slow healing wounds or pain/weakness in her lower extremities with walking.   I discussed in depth with the patient the nature of atherosclerosis, and emphasized the importance of maximal medical management including strict control of blood pressure, blood glucose, and lipid levels, obtaining regular exercise, and continued cessation of smoking.  The patient is aware that without maximal medical management the underlying atherosclerotic disease process will progress, limiting the benefit of any interventions.  The patient was given information about PAD including signs, symptoms, treatment, what symptoms should prompt the patient to seek immediate medical care, and risk reduction measures to take.  Clemon Chambers, RN, MSN, FNP-C [facility]  Phone: [phone]  Clinic MD: Scot Dock on call  04/07/16 11:40 AM

## 2016-04-14 ENCOUNTER — Encounter (HOSPITAL_COMMUNITY): Payer: Commercial Managed Care - HMO

## 2016-04-14 ENCOUNTER — Ambulatory Visit: Payer: Commercial Managed Care - HMO | Admitting: Family

## 2016-04-19 ENCOUNTER — Other Ambulatory Visit: Payer: Self-pay | Admitting: Family Medicine

## 2016-05-09 ENCOUNTER — Ambulatory Visit (INDEPENDENT_AMBULATORY_CARE_PROVIDER_SITE_OTHER): Payer: Medicare HMO

## 2016-05-09 VITALS — BP 128/80 | HR 60 | Temp 98.1°F | Ht 66.0 in | Wt 122.0 lb

## 2016-05-09 DIAGNOSIS — E559 Vitamin D deficiency, unspecified: Secondary | ICD-10-CM | POA: Diagnosis not present

## 2016-05-09 DIAGNOSIS — E7800 Pure hypercholesterolemia, unspecified: Secondary | ICD-10-CM

## 2016-05-09 DIAGNOSIS — E038 Other specified hypothyroidism: Secondary | ICD-10-CM

## 2016-05-09 DIAGNOSIS — Z0000 Encounter for general adult medical examination without abnormal findings: Secondary | ICD-10-CM | POA: Diagnosis not present

## 2016-05-09 LAB — T3, FREE: T3, Free: 3.1 pg/mL (ref 2.3–4.2)

## 2016-05-09 LAB — VITAMIN D 25 HYDROXY (VIT D DEFICIENCY, FRACTURES): VITD: 33.86 ng/mL (ref 30.00–100.00)

## 2016-05-09 LAB — LIPID PANEL
Cholesterol: 126 mg/dL (ref 0–200)
HDL: 48.2 mg/dL (ref >39.00)
LDL Cholesterol: 55 mg/dL (ref 0–99)
NonHDL: 77.68
TRIGLYCERIDES: 115 mg/dL (ref 0.0–149.0)
Total CHOL/HDL Ratio: 3
VLDL: 23 mg/dL (ref 0.0–40.0)

## 2016-05-09 LAB — T4, FREE: FREE T4: 1.02 ng/dL (ref 0.60–1.60)

## 2016-05-09 LAB — TSH: TSH: 0.84 u[IU]/mL (ref 0.35–4.50)

## 2016-05-09 NOTE — Patient Instructions (Signed)
Amy Stone , Thank you for taking time to come for your Medicare Wellness Visit. I appreciate your ongoing commitment to your health goals. Please review the following plan we discussed and let me know if I can assist you in the future.   These are the goals we discussed: Goals    . Eat more fruits and vegetables          Starting 05/09/16, I will continue to eat at least 4-5 servings of fruits and vegetables daily.        This is a list of the screening recommended for you and due dates:  Health Maintenance  Topic Date Due  . Colon Cancer Screening  07/02/2020*  . Tetanus Vaccine  05/27/2018  . Flu Shot  Completed  . DEXA scan (bone density measurement)  Completed  . Shingles Vaccine  Completed  . Pneumonia vaccines  Completed  *Topic was postponed. The date shown is not the original due date.   Preventive Care for Adults  A healthy lifestyle and preventive care can promote health and wellness. Preventive health guidelines for adults include the following key practices.  . A routine yearly physical is a good way to check with your health care provider about your health and preventive screening. It is a chance to share any concerns and updates on your health and to receive a thorough exam.  . Visit your dentist for a routine exam and preventive care every 6 months. Brush your teeth twice a day and floss once a day. Good oral hygiene prevents tooth decay and gum disease.  . The frequency of eye exams is based on your age, health, family medical history, use  of contact lenses, and other factors. Follow your health care provider's ecommendations for frequency of eye exams.  . Eat a healthy diet. Foods like vegetables, fruits, whole grains, low-fat dairy products, and lean protein foods contain the nutrients you need without too many calories. Decrease your intake of foods high in solid fats, added sugars, and salt. Eat the right amount of calories for you. Get information about a  proper diet from your health care provider, if necessary.  . Regular physical exercise is one of the most important things you can do for your health. Most adults should get at least 150 minutes of moderate-intensity exercise (any activity that increases your heart rate and causes you to sweat) each week. In addition, most adults need muscle-strengthening exercises on 2 or more days a week.  Silver Sneakers may be a benefit available to you. To determine eligibility, you may visit the website: www.silversneakers.com or contact program at (902)172-9813 Mon-Fri between 8AM-8PM.   . Maintain a healthy weight. The body mass index (BMI) is a screening tool to identify possible weight problems. It provides an estimate of body fat based on height and weight. Your health care provider can find your BMI and can help you achieve or maintain a healthy weight.   For adults 20 years and older: ? A BMI below 18.5 is considered underweight. ? A BMI of 18.5 to 24.9 is normal. ? A BMI of 25 to 29.9 is considered overweight. ? A BMI of 30 and above is considered obese.   . Maintain normal blood lipids and cholesterol levels by exercising and minimizing your intake of saturated fat. Eat a balanced diet with plenty of fruit and vegetables. Blood tests for lipids and cholesterol should begin at age 59 and be repeated every 5 years. If your lipid or cholesterol  levels are high, you are over 50, or you are at high risk for heart disease, you may need your cholesterol levels checked more frequently. Ongoing high lipid and cholesterol levels should be treated with medicines if diet and exercise are not working.  . If you smoke, find out from your health care provider how to quit. If you do not use tobacco, please do not start.  . If you choose to drink alcohol, please do not consume more than 2 drinks per day. One drink is considered to be 12 ounces (355 mL) of beer, 5 ounces (148 mL) of wine, or 1.5 ounces (44 mL) of  liquor.  . If you are 12-34 years old, ask your health care provider if you should take aspirin to prevent strokes.  . Use sunscreen. Apply sunscreen liberally and repeatedly throughout the day. You should seek shade when your shadow is shorter than you. Protect yourself by wearing long sleeves, pants, a wide-brimmed hat, and sunglasses year round, whenever you are outdoors.  . Once a month, do a whole body skin exam, using a mirror to look at the skin on your back. Tell your health care provider of new moles, moles that have irregular borders, moles that are larger than a pencil eraser, or moles that have changed in shape or color.

## 2016-05-09 NOTE — Progress Notes (Signed)
I reviewed health advisor's note, was available for consultation, and agree with documentation and plan.  Amy Bedsole, MD [facility] [place]  

## 2016-05-09 NOTE — Progress Notes (Signed)
Subjective:   Amy Stone is a 76 y.o. female who presents for Medicare Annual (Subsequent) preventive examination.  Review of Systems:  N/A Cardiac Risk Factors include: advanced age (>19mn, >>1women);hypertension;dyslipidemia     Objective:     Vitals: BP 128/80 (BP Location: Left Arm, Patient Position: Sitting, Cuff Size: Normal)   Pulse 60   Temp 98.1 F (36.7 C) (Oral)   Ht '5\' 6"'$  (1.676 m) Comment: shoes  Wt 122 lb (55.3 kg)   SpO2 95%   BMI 19.69 kg/m   Body mass index is 19.69 kg/m.   Tobacco History  Smoking Status  . Former Smoker  . Years: 1.00  . Types: Cigarettes  . Quit date: 11/24/2005  Smokeless Tobacco  . Never Used    Comment: 40 pack years      Counseling given: No   Past Medical History:  Diagnosis Date  . AAA (abdominal aortic aneurysm) ([place]   . Blood transfusion   . Breast CA ([facility] 2016   right- radiation  . Breast screening, unspecified   . Cancer ([facility] 2015   Bladder  . 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 Cervicalgia   . Chronic airway obstruction, not elsewhere classified   . Coronary atherosclerosis of unspecified type of vessel, native or graft 2007  . Epigastric pain   . Myocardial infarction 2007  . Neoplasm of uncertain behavior of skin   . Osteoarthrosis, unspecified whether generalized or localized, unspecified site   . Osteoporosis, unspecified   . Other and unspecified hyperlipidemia   . Other chest pain   . Peripheral vascular disease ([facility]   . PONV (postoperative nausea and vomiting)   . Shortness of breath   . Thoracic aneurysm, ruptured ([place]   . Thyroid nodule    seen on CT Scan 07/19/2011  . Unspecified essential hypertension   . Unspecified hypothyroidism   . Unspecified tinnitus   . UTI (lower urinary tract infection)    Past Surgical History:  Procedure Laterality Date  . ABDOMINAL AORTAGRAM N/A 05/15/2011   Procedure: ABDOMINAL AMaxcine Ham  Surgeon: CAngelia Mould MD;  [facility];  Service: Cardiovascular;  Laterality: N/A;  . ABDOMINAL HYSTERECTOMY    . acute inferior mi  11/2005  . AORTA - BILATERAL FEMORAL ARTERY BYPASS GRAFT  07/25/2011   Procedure: AORTA BIFEMORAL BYPASS GRAFT;  Surgeon: CAngelia Mould MD;  [place] [facility]  Service: Vascular;  Laterality: Bilateral;  . BREAST BIOPSY Right 2016   positive  . BREAST LUMPECTOMY Right Feb. 2016  . CORONARY ARTERY BYPASS GRAFT  11/2005  . goiter removal  1979  . PORT WINE STAIN REMOVAL W/ LASER  2004   bladder cancer  . PR VEIN BYPASS GRAFT,AORTO-FEM-POP  July 25, 2011   Family History  Problem Relation Age of Onset  . Alzheimer's disease Father   . Parkinsonism Father   . Heart defect Mother     enlarged heart  . Heart attack Mother   . Hypertension Sister   . Diabetes Sister   . Heart disease Sister   . Hyperlipidemia Sister   . Breast cancer Sister 632 . Hypothyroidism Sister   . Diabetes Sister   . Aneurysm Paternal Grandfather   . Breast cancer Daughter 482  History  Sexual Activity  . Sexual activity: Yes  . Birth control/ protection: Post-menopausal    Outpatient Encounter Prescriptions as of 05/09/2016  Medication Sig  . aspirin 81 MG tablet  Take 81 mg by mouth daily.  [name] atenolol (TENORMIN) 25 MG tablet   . atorvastatin (LIPITOR) 80 MG tablet   . letrozole (FEMARA) 2.5 MG tablet   . levothyroxine (SYNTHROID, LEVOTHROID) 50 MCG tablet TAKE ONE-HALF TABLET BY MOUTH ONCE DAILY  . losartan (COZAAR) 50 MG tablet TAKE ONE TABLET BY MOUTH ONCE DAILY  . tiotropium (SPIRIVA) 18 MCG inhalation capsule Place 18 mcg into inhaler and inhale daily.   No facility-administered encounter medications on file as of 05/09/2016.     Activities of Daily Living In your present state of health, do you have any difficulty performing the following activities: 05/09/2016  Hearing? Y  Vision? Y  Difficulty concentrating or making decisions? N  Walking or climbing stairs? N  Dressing or bathing?  N  Doing errands, shopping? Y  Preparing Food and eating ? N  Using the Toilet? N  In the past six months, have you accidently leaked urine? Y  Do you have problems with loss of bowel control? N  Managing your Medications? N  Managing your Finances? N  Housekeeping or managing your Housekeeping? N  Some recent data might be hidden    Patient Care Team: Jinny Sanders, MD as PCP - General (Family Medicine) [name], MD (Cardiology) Ivin Poot, MD as Attending Physician (Cardiothoracic Surgery) Angelia Mould, MD as Attending Physician (Vascular Surgery) Lequita Asal, MD as Referring Physician (Hematology and Oncology)    Assessment:     Visual Acuity Screening   Right eye Left eye Both eyes  Without correction:     With correction: 20/100 20/100 20/100  Hearing Screening Comments: Wears bilateral hearing aids   Exercise Activities and Dietary recommendations Current Exercise Habits: The patient does not participate in regular exercise at present, Exercise limited by: None identified  Goals    . Eat more fruits and vegetables          Starting 05/09/16, I will continue to eat at least 4-5 servings of fruits and vegetables daily.       Fall Risk Fall Risk  05/09/2016 05/04/2015 04/28/2014 04/03/2013  Falls in the past year? Yes No No No  Number falls in past yr: 2 or more - - -  Injury with Fall? No - - -   Depression Screen PHQ 2/9 Scores 05/09/2016 05/04/2015 04/28/2014 04/03/2013  PHQ - 2 Score 0 0 0 0     Cognitive Function MMSE - Mini Mental State Exam 05/09/2016  Orientation to time 5  Orientation to Place 5  Registration 3  Attention/ Calculation 0  Recall 3  Language- name 2 objects 0  Language- repeat 1  Language- follow 3 step command 3  Language- read & follow direction 0  Write a sentence 0  Copy design 0  Total score 20     PLEASE NOTE: A Mini-Cog screen was completed. Maximum score is 20. A value of 0 denotes this part of  [place] MMSE was not completed or the patient failed this part of the Mini-Cog screening.   Mini-Cog Screening Orientation to Time - Max 5 pts Orientation to Place - Max 5 pts Registration - Max 3 pts Recall - Max 3 pts Language Repeat - Max 1 pts Language Follow 3 Step Command - Max 3 pts     Immunization History  Administered Date(s) Administered  . Influenza Split 01/19/2011, 12/22/2011  . Influenza Whole 04/15/2007, 02/05/2008, 01/15/2009, 01/12/2010  . Influenza, High Dose Seasonal PF 01/20/2016  . Influenza,inj,Quad  PF,36+ Mos 01/21/2013, 01/22/2014, 02/05/2015  . Pneumococcal Conjugate-13 01/22/2014  . Pneumococcal Polysaccharide-23 05/27/2008  . Td 05/27/2008  . Zoster 01/23/2013   Screening Tests Health Maintenance  Topic Date Due  . COLONOSCOPY  07/02/2020 (Originally 07/04/2015)  . TETANUS/TDAP  05/27/2018  . INFLUENZA VACCINE  Completed  . DEXA SCAN  Completed  . ZOSTAVAX  Completed  . PNA vac Low Risk Adult  Completed      Plan:     I have personally reviewed and addressed the Medicare Annual Wellness questionnaire and have noted the following in the patient's chart:  A. Medical and social history B. Use of alcohol, tobacco or illicit drugs  C. Current medications and supplements D. Functional ability and status E.  Nutritional status F.  Physical activity G. Advance directives H. List of other physicians I.  Hospitalizations, surgeries, and ER visits in previous 12 months J.  [facility] to include hearing, vision, cognitive, depression L. Referrals and appointments - none  In addition, I have reviewed and discussed with patient certain preventive protocols, quality metrics, and best practice recommendations. A written personalized care plan for preventive services as well as general preventive health recommendations were provided to patient.  See attached scanned questionnaire for additional information.   Signed,   Lindell Noe, MHA,  BS, LPN Health Coach

## 2016-05-09 NOTE — Progress Notes (Signed)
Pre visit review using our clinic review tool, if applicable. No additional management support is needed unless otherwise documented below in the visit note. 

## 2016-05-09 NOTE — Progress Notes (Signed)
PCP notes:   Health maintenance:  Colonoscopy - addressed; pt plans to schedule future appt  Abnormal screenings:   Fall risk - pt reports two falls without injury  Patient concerns:   None  Nurse concerns:  None  Next PCP appt:   05/12/16 @ 1115

## 2016-05-11 ENCOUNTER — Other Ambulatory Visit: Payer: Self-pay | Admitting: *Deleted

## 2016-05-11 MED ORDER — LETROZOLE 2.5 MG PO TABS: 2.5000 mg | ORAL_TABLET | Freq: Every day | ORAL | 0 refills | Status: DC

## 2016-05-12 ENCOUNTER — Encounter: Payer: Self-pay | Admitting: Family Medicine

## 2016-05-12 ENCOUNTER — Ambulatory Visit (INDEPENDENT_AMBULATORY_CARE_PROVIDER_SITE_OTHER): Payer: Medicare HMO | Admitting: Family Medicine

## 2016-05-12 VITALS — BP 142/66 | HR 59 | Temp 97.6°F | Ht 66.0 in | Wt 121.5 lb

## 2016-05-12 DIAGNOSIS — I10 Essential (primary) hypertension: Secondary | ICD-10-CM

## 2016-05-12 DIAGNOSIS — I771 Stricture of artery: Secondary | ICD-10-CM

## 2016-05-12 DIAGNOSIS — Z170 Estrogen receptor positive status [ER+]: Secondary | ICD-10-CM

## 2016-05-12 DIAGNOSIS — J449 Chronic obstructive pulmonary disease, unspecified: Secondary | ICD-10-CM

## 2016-05-12 DIAGNOSIS — I70213 Atherosclerosis of native arteries of extremities with intermittent claudication, bilateral legs: Secondary | ICD-10-CM | POA: Diagnosis not present

## 2016-05-12 DIAGNOSIS — C50111 Malignant neoplasm of central portion of right female breast: Secondary | ICD-10-CM

## 2016-05-12 DIAGNOSIS — C679 Malignant neoplasm of bladder, unspecified: Secondary | ICD-10-CM | POA: Diagnosis not present

## 2016-05-12 DIAGNOSIS — E038 Other specified hypothyroidism: Secondary | ICD-10-CM

## 2016-05-12 DIAGNOSIS — E782 Mixed hyperlipidemia: Secondary | ICD-10-CM

## 2016-05-12 MED ORDER — ATENOLOL 25 MG PO TABS: 25.0000 mg | ORAL_TABLET | Freq: Every day | ORAL | 3 refills | Status: DC

## 2016-05-12 MED ORDER — ALBUTEROL SULFATE HFA 108 (90 BASE) MCG/ACT IN AERS: 2.0000 | INHALATION_SPRAY | Freq: Four times a day (QID) | RESPIRATORY_TRACT | 2 refills | Status: DC | PRN

## 2016-05-12 MED ORDER — FLUTICASONE-SALMETEROL 250-50 MCG/DOSE IN AEPB: 1.0000 | INHALATION_SPRAY | Freq: Two times a day (BID) | RESPIRATORY_TRACT | 11 refills | Status: DC

## 2016-05-12 MED ORDER — ATORVASTATIN CALCIUM 80 MG PO TABS: 80.0000 mg | ORAL_TABLET | Freq: Every day | ORAL | 3 refills | Status: DC

## 2016-05-12 MED ORDER — LOSARTAN POTASSIUM 50 MG PO TABS: 50.0000 mg | ORAL_TABLET | Freq: Every day | ORAL | 3 refills | Status: DC

## 2016-05-12 MED ORDER — LEVOTHYROXINE SODIUM 50 MCG PO TABS: 25.0000 ug | ORAL_TABLET | Freq: Every day | ORAL | 3 refills | Status: DC

## 2016-05-12 MED ORDER — LETROZOLE 2.5 MG PO TABS: 2.5000 mg | ORAL_TABLET | Freq: Every day | ORAL | 3 refills | Status: DC

## 2016-05-12 NOTE — Assessment & Plan Note (Addendum)
Followed by ONc, Dr. Bryson Ha. Treated with femara  Plan 5 years, on year 2.

## 2016-05-12 NOTE — Assessment & Plan Note (Signed)
Stable control. Followed by vascular MD.

## 2016-05-12 NOTE — Assessment & Plan Note (Signed)
Followed by vascular MD. 

## 2016-05-12 NOTE — Assessment & Plan Note (Signed)
Well controlled. Continue current medication.  

## 2016-05-12 NOTE — Assessment & Plan Note (Signed)
Inadequate control and unabele to afford spiriva.  Will change her to LABA and ICH.  Follow up in 3 months.

## 2016-05-12 NOTE — Progress Notes (Signed)
Subjective:    Patient ID: Amy Stone, female    DOB: 09-17-1940, 76 y.o.   MRN: [id number]  HPI 76 year old female  The patient saw Candis Musa, LPN for medicare wellness. Note reviewed in detail and important notes copied below. Health maintenance: Colonoscopy - addressed; pt plans to schedule future appt  Abnormal screenings:  Fall risk - pt reports two falls without injury  Patient concerns:  None  Nurse concerns: None  TODAY:  Hypertension:    Borderline elevation today on atenolol, lotensin BP Readings from Last 3 Encounters:  05/12/16 (!) 142/66  05/09/16 128/80  04/07/16 120/70    Using medication without problems or lightheadedness:  none Chest pain with exertion:none Edema: none Short of breath: see below Average home BPs: Other issues:  Elevated Cholesterol:  High risk, on high doe statin lipitor 80 mg daily, good control. Lab Results  Component Value Date   CHOL 126 05/09/2016   HDL 48.20 05/09/2016   LDLCALC 55 05/09/2016   TRIG 115.0 05/09/2016   CHOLHDL 3 05/09/2016  Using medications without problems:none Muscle aches: none Diet compliance: GOod Exercise: walking Other complaints:   COPD, mod: moderate SOB, no wheeze on spiriva, cannot afford now.   Coughing a lot. No fever.     Hypothyroid:  Stable control on current dose of levothyroxine Lab Results  Component Value Date   TSH 0.84 05/09/2016   Hx of bladder cancer: pt states she needs referral to D.r Rogers Blocker to continue seeing him for bladder cancer history.  Social History /Family History/Past Medical History reviewed and updated if needed.  Review of Systems  Constitutional: Negative for fatigue and fever.  HENT: Negative for congestion.   Eyes: Negative for pain.  Respiratory: Positive for cough and shortness of breath.   Cardiovascular: Negative for chest pain, palpitations and leg swelling.  Gastrointestinal: Negative for abdominal pain.  Genitourinary:  Negative for dysuria and vaginal bleeding.  Musculoskeletal: Positive for back pain.  Neurological: Negative for syncope, light-headedness and headaches.  Psychiatric/Behavioral: Negative for dysphoric mood.       Objective:   Physical Exam  Constitutional: Vital signs are normal. She appears well-developed and well-nourished. She is cooperative.  Non-toxic appearance. She does not appear ill. No distress.  HENT:  Head: Normocephalic.  Right Ear: Hearing, tympanic membrane, external ear and ear canal normal.  Left Ear: Hearing, tympanic membrane, external ear and ear canal normal.  Nose: Nose normal.  Eyes: Conjunctivae, EOM and lids are normal. Pupils are equal, round, and reactive to light. Lids are everted and swept, no foreign bodies found.  Neck: Trachea normal and normal range of motion. Neck supple. Carotid bruit is not present. No thyroid mass and no thyromegaly present.  Cardiovascular: Normal rate, regular rhythm, S1 normal, S2 normal, normal heart sounds and intact distal pulses.  Exam reveals no gallop.   No murmur heard. Pulmonary/Chest: Effort normal and breath sounds normal. No respiratory distress. She has no wheezes. She has no rhonchi. She has no rales.  Abdominal: Soft. Normal appearance and bowel sounds are normal. She exhibits no distension, no fluid wave, no abdominal bruit and no mass. There is no hepatosplenomegaly. There is no tenderness. There is no rebound, no guarding and no CVA tenderness. No hernia.  Lymphadenopathy:    She has no cervical adenopathy.    She has no axillary adenopathy.  Neurological: She is alert. She has normal strength. No cranial nerve deficit or sensory deficit.  Skin: Skin is warm,  dry and intact. No rash noted.  Psychiatric: Her speech is normal and behavior is normal. Judgment normal. Her mood appears not anxious. Cognition and memory are normal. She does not exhibit a depressed mood.          Assessment & Plan:  The patient's  preventative maintenance and recommended screening tests for an annual wellness exam were reviewed in full today. Brought up to date unless services declined.  Counselled on the importance of diet, exercise, and its role in overall health and mortality. The patient's FH and SH was reviewed, including their home life, tobacco status, and drug and alcohol status.   Former smoker  Quit 11 years ago, 64  Years but 1/2 pack per day: 25 pack year history.  Not interested in lung cancer screening program. Vaccines:Uptodate. Mammogram:  Right ductal breast cancer,February of 2016 status post lumpectomy and sentinel lymph node evaluation (June 02, 2014) treated by Dr. Bryson Ha, plan mammo 05/2016 Colon: 2009 Dr. Kristopher Glee, polyp, due for repeat in 2019 DEXA: 2016  Osteoporosis -3.2 hip on Femara currently. Stopped fosamax in past given cost, due for repeat eval this year. DVE/PAP: not indicated, TAH for non-cancer reasons.   Fall risk: reviewed home safety and fall prevention.

## 2016-05-12 NOTE — Assessment & Plan Note (Signed)
Stable control on current dose.

## 2016-05-12 NOTE — Patient Instructions (Addendum)
Stop spiriva, start salmetrol/fluticasone twice daily for shortness of breath from COPD.  If unable to afford or no improvement in next month.. Call. Can use albuterol inhaler for rescue, if sudden shortness of breath and wheeze.   Preventing Falls and Fractures  Falls can be very serious, especially for older adults or people with osteoporosis  Falls can be caused by:  Tripping or slipping  Slow reflexes  Balance problems  Reduced muscle strength  Poor vision or a recent change in prescription  Illness and some medications (especially blood pressure pills, diuretics, heart medicines, muscle relaxants and sleep medications)  Drinking alcohol  To prevent falls outdoors:  Use a can or walker if needed  Wear rubber-soled shoes so you don't slip  DO NOT buy "shape up" shoes with rocker bottom soles if you have balance problems.  The thick soles and shape make it more difficult to keep your balance.  Put kitty litter or salt on icy sidewalks  Walk on the grass if the sidewalks are slick  Avoid walking on uneven ground whenever possible  T prevent falls indoors:  Keep rooms clutter-free, especially hallways, stairs and paths to light switches  Remove throw rugs  Install night lights, especially to and in the bathroom  Turn on lights before going downstairs  Keep a flashlight next to your bed  Buy a cordless phone to keep with you instead of jumping up to answer the phone  Install grab bars in the bathroom near the shower and toilet  Install rails on both sides of the stairs.  Make sure the stairs are well lit  Wear slippers with non-skid soles.  Do not walk around in stockings or socks  Balance problems and dizziness are not a normal part of growing older.  If you begin having balance problems or dizziness see your doctor.  Physical Therapy can help you with many balance problems, strengthening hip and leg muscles and with gait training.  To keep your bones  healthy make sure you are getting enough calcium and Vitamin D each day.  Ask your doctor or pharmacist about supplements.  Regular weight-bearing exercise like walking, lifting weights or dancing can help strengthen bones and prevent osteoporosis.

## 2016-05-12 NOTE — Assessment & Plan Note (Signed)
Good control on high dose statin.

## 2016-05-12 NOTE — Progress Notes (Signed)
Pre visit review using our clinic review tool, if applicable. No additional management support is needed unless otherwise documented below in the visit note. 

## 2016-05-25 ENCOUNTER — Ambulatory Visit
Admission: RE | Admit: 2016-05-25 | Discharge: 2016-05-25 | Disposition: A | Discharge status: Home / Self Care | Payer: Medicare HMO | Source: Ambulatory Visit | Attending: Radiation Oncology | Admitting: Radiation Oncology

## 2016-05-25 ENCOUNTER — Other Ambulatory Visit: Payer: Self-pay | Admitting: Radiation Oncology

## 2016-05-25 DIAGNOSIS — R928 Other abnormal and inconclusive findings on diagnostic imaging of breast: Secondary | ICD-10-CM | POA: Diagnosis not present

## 2016-05-25 DIAGNOSIS — C50911 Malignant neoplasm of unspecified site of right female breast: Secondary | ICD-10-CM | POA: Diagnosis not present

## 2016-07-06 IMAGING — MG MM DIAG BREAST TOMO BILATERAL
9 of 13 series · 9 of 29 positions shown · non-contrast
Comparison: Previous exam(s).

CLINICAL DATA: History of treated right breast cancer, status post
lumpectomy and radiation therapy in 4286.

EXAM:
DIGITAL DIAGNOSTIC BILATERAL MAMMOGRAM WITH 3D TOMOSYNTHESIS AND CAD

[R CC (1 of 2)]
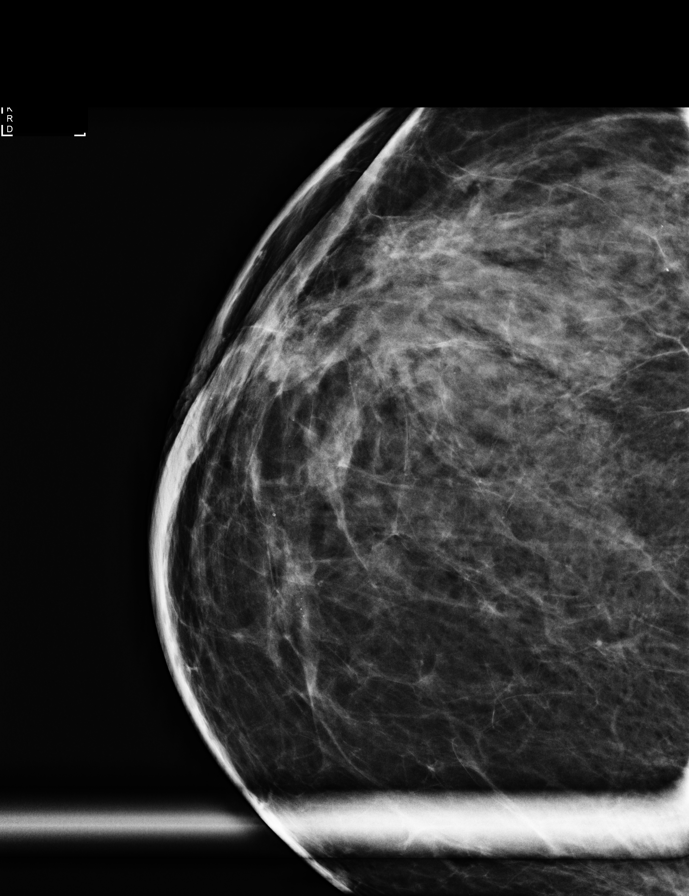

[R CC (2 of 2)]
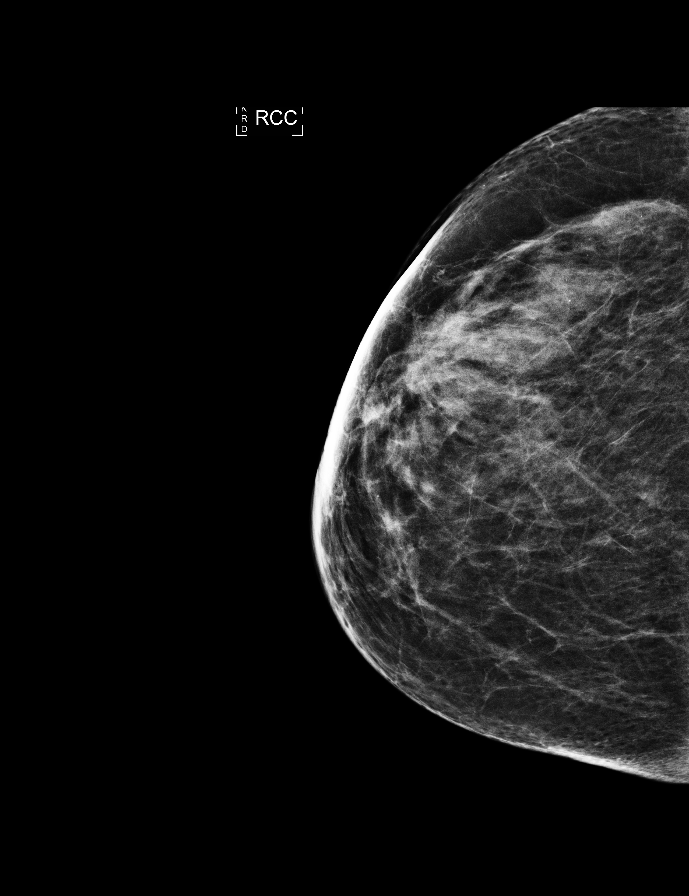

[L CC]
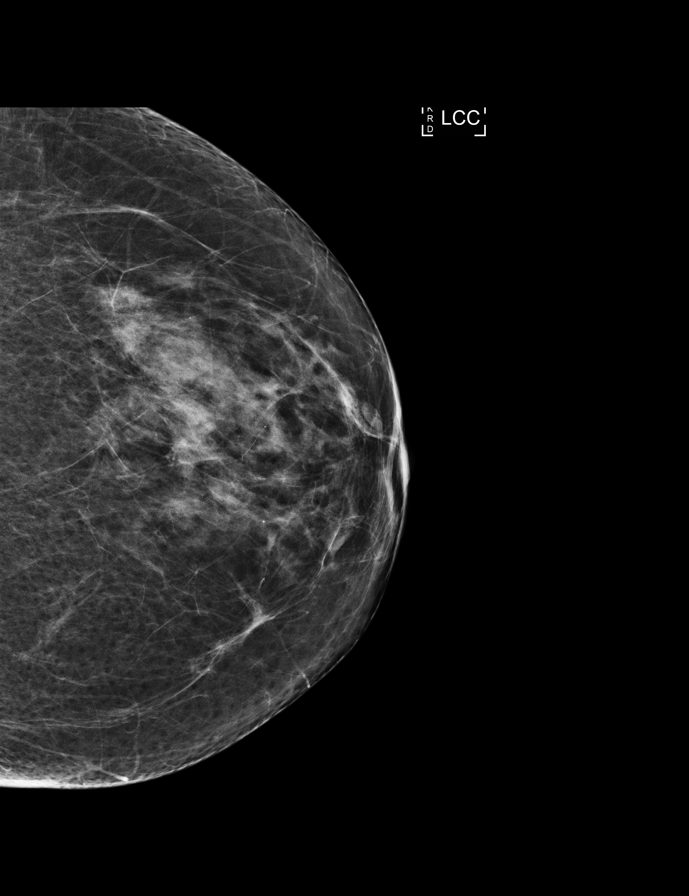

[R MLO synth-2D]
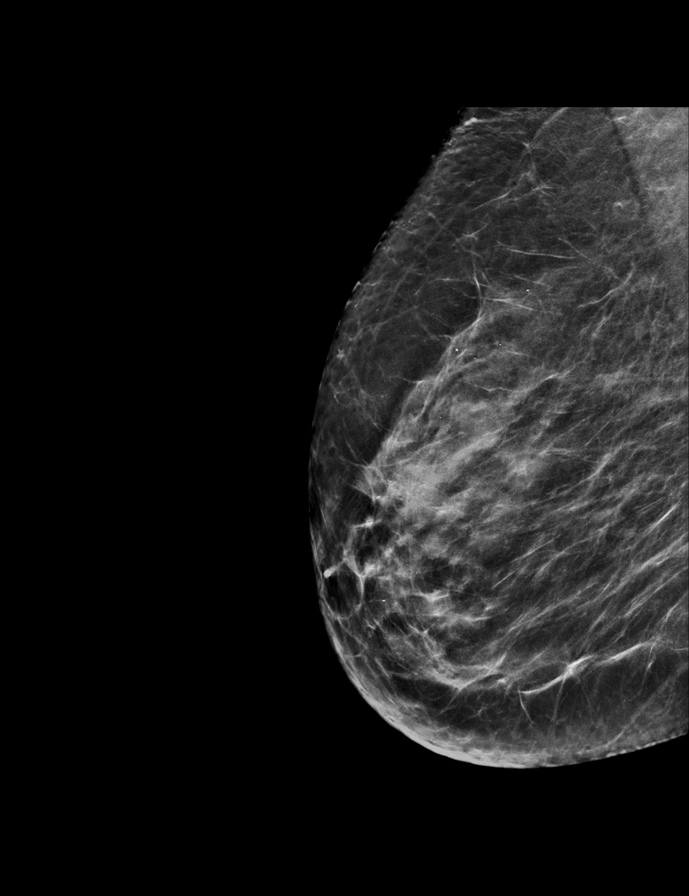

[L MLO synth-2D]
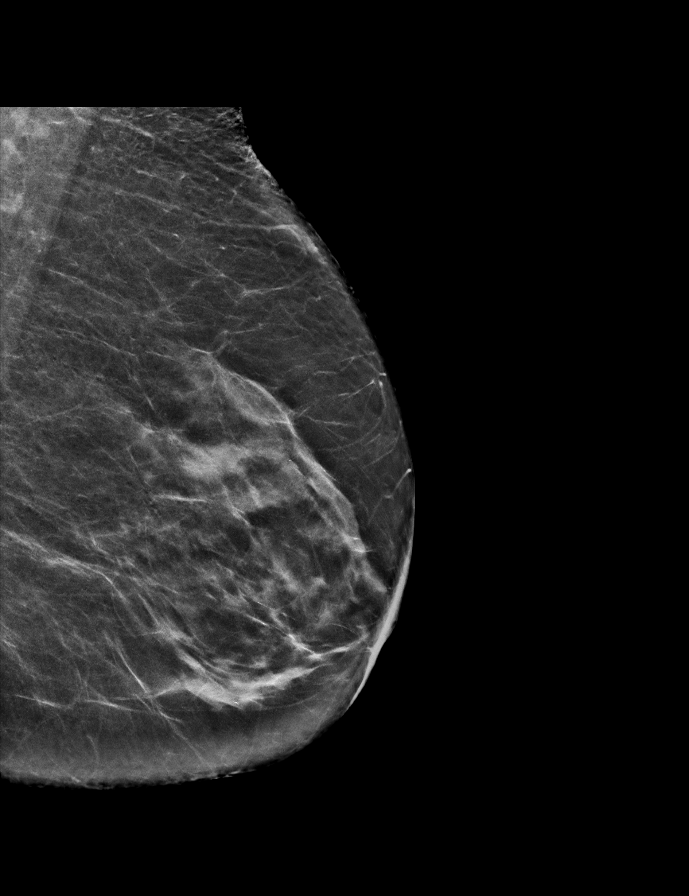

[R CC synth-2D]
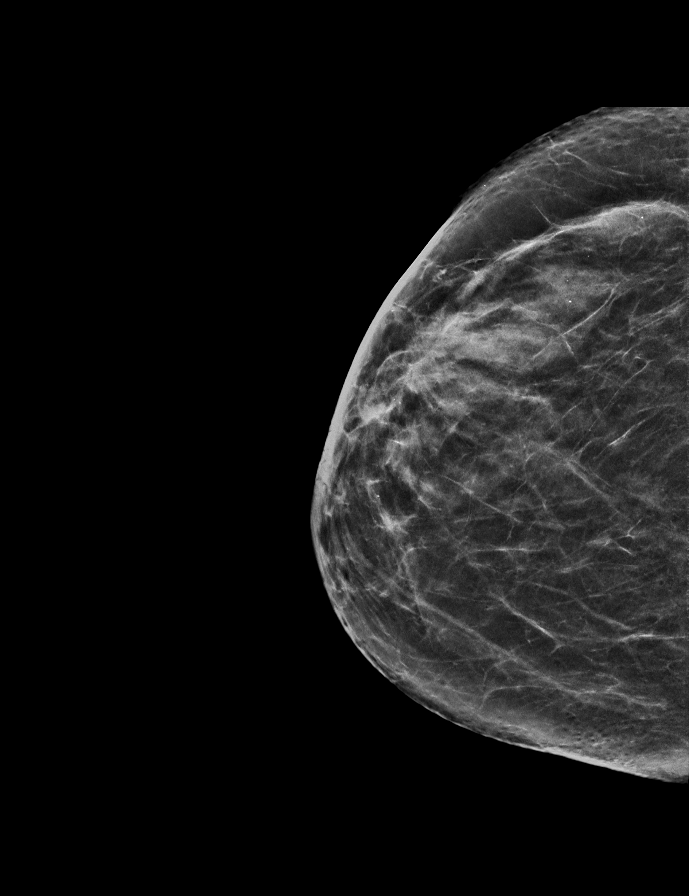

[L CC synth-2D]
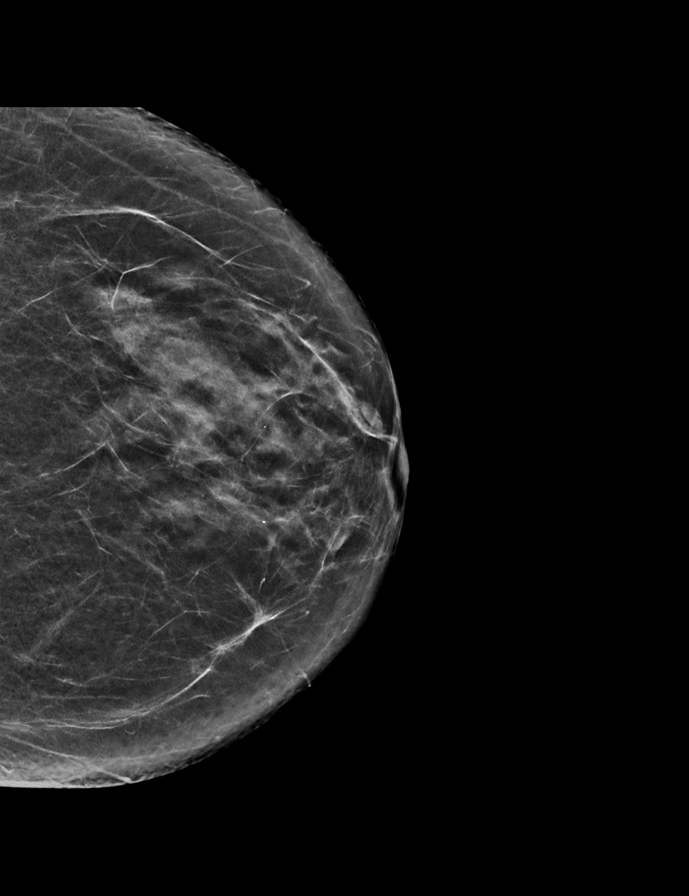

[L MLO]
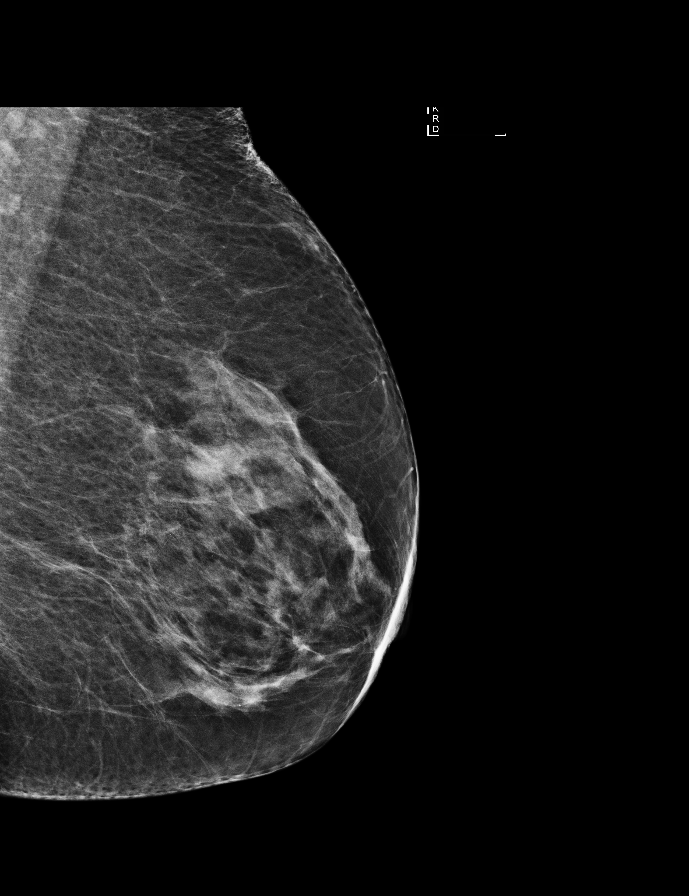

[R MLO]
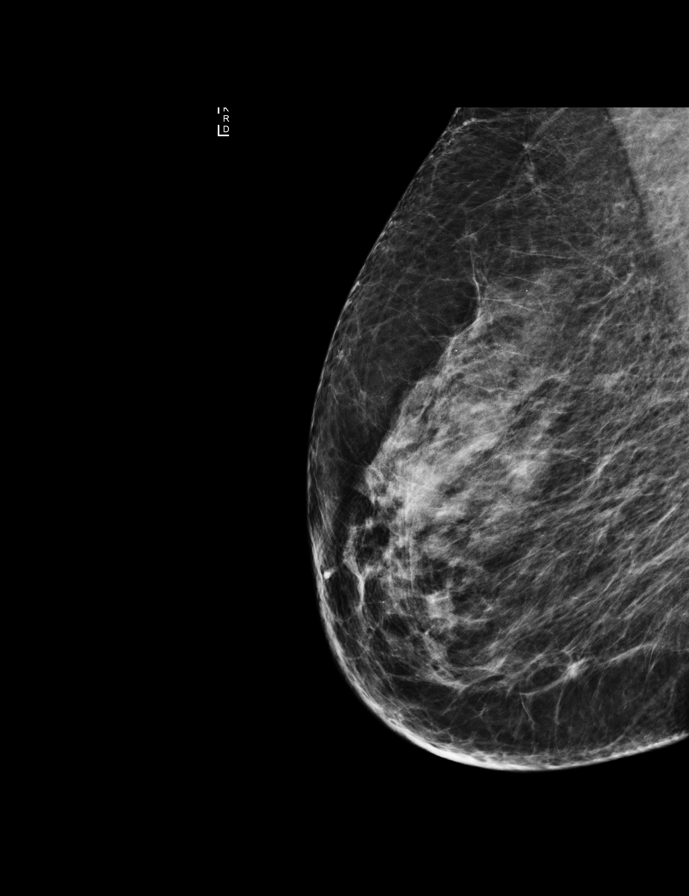

[9 of 29 positions shown; findings below may reference images not displayed]

ACR Breast Density Category c: The breast tissue is heterogeneously
dense, which may obscure small masses.
FINDINGS: Mammographically, there are no suspicious masses, areas of
nonsurgical architectural distortion or microcalcifications in
either breast. Postsurgical changes are seen in the right breast
upper outer quadrant, anterior depth, with removal of the nipple.
Skin thickening of the right breast is also noted anteriorly, likely
secondary to post radiation changes.

Mammographic images were processed with CAD.
IMPRESSION: No mammographic evidence of malignancy in either breast, status post
right lumpectomy.

RECOMMENDATION:
Diagnostic mammogram is suggested in 1 year. (Code:Z6-H-ZOR)

I have discussed the findings and recommendations with the patient.
Results were also provided in writing at the conclusion of the
visit. If applicable, a reminder letter will be sent to the patient
regarding the next appointment.

BI-RADS CATEGORY  2: Benign.

## 2016-07-24 ENCOUNTER — Inpatient Hospital Stay: Payer: Medicare HMO

## 2016-07-24 ENCOUNTER — Inpatient Hospital Stay: Payer: Medicare HMO | Admitting: Hematology and Oncology

## 2016-07-25 DIAGNOSIS — H16223 Keratoconjunctivitis sicca, not specified as Sjogren's, bilateral: Secondary | ICD-10-CM | POA: Diagnosis not present

## 2016-08-06 NOTE — Progress Notes (Signed)
[facility] day:  08/07/16  Chief Complaint: DESIRAI TRAXLER is a 76 y.o. female with stage IB right breast cancer who is seen for 4 month assessment.  HPI:  The patient was last seen in the medical oncology clinic on 03/24/2016.   At that time, she was doing well.  Exam revealed tender fibrocystic changes.  Labs revealed a normal CBC with diff, CMP (except for a sodium of 132), and CA27.29.  Mammogram on 05/25/2016 revealed no evidence of malignancy.  During the interim, she notes intermittent right breast pain that radiates toward the center of her chest.  Discomfort is described as a grabbing sensation.  She denies any masses, erythema or nipple discharge. She denies any bone pain.   Past Medical History:  Diagnosis Date  . AAA (abdominal aortic aneurysm) ([place])   . Blood transfusion   . Breast CA ([place]) 2016   right breast with lumpectomy and rad tx  . Breast cancer ([place]) 2016   right breast with lumpectomy and rad tx  . Breast screening, unspecified   . Cancer [facility]) 2015   Bladder  . 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 Cervicalgia   . Chronic airway obstruction, not elsewhere classified   . Coronary atherosclerosis of unspecified type of vessel, native or graft 2007  . Epigastric pain   . Myocardial infarction ([place]) 2007  . Neoplasm of uncertain behavior of skin   . Osteoarthrosis, unspecified whether generalized or localized, unspecified site   . Osteoporosis, unspecified   . Other and unspecified hyperlipidemia   . Other chest pain   . Peripheral vascular disease ([place])   . PONV (postoperative nausea and vomiting)   . Shortness of breath   . Thoracic aneurysm, ruptured ([place])   . Thyroid nodule    seen on CT Scan 07/19/2011  . Unspecified essential hypertension   . Unspecified hypothyroidism   . Unspecified tinnitus   . UTI (lower urinary tract infection)     Past Surgical History:  Procedure Laterality Date  .  ABDOMINAL AORTAGRAM N/A 05/15/2011   Procedure: ABDOMINAL Maxcine Ham;  Surgeon: Angelia Mould, MD;  Location: [facility] C[facility] LAB;  Service: Cardiovascular;  Laterality: N/A;  . ABDOMINAL HYSTERECTOMY    . acute inferior mi  11/2005  . AORTA - BILATERAL FEMORAL ARTERY BYPASS GRAFT  07/25/2011   Procedure: AORTA BIFEMORAL BYPASS GRAFT;  Surgeon: Angelia Mould, MD;  Location: [place];  Service: Vascular;  Laterality: Bilateral;  . BREAST BIOPSY Right 2016   positive  . BREAST LUMPECTOMY Right Feb. 2016  . CORONARY ARTERY BYPASS GRAFT  11/2005  . goiter removal  1979  . PORT WINE STAIN REMOVAL W/ LASER  2004   bladder cancer  . PR VEIN BYPASS GRAFT,AORTO-FEM-POP  July 25, 2011    Family History  Problem Relation Age of Onset  . Alzheimer's disease Father   . Parkinsonism Father   . Heart defect Mother     enlarged heart  . Heart attack Mother   . Hypertension Sister   . Diabetes Sister   . Heart disease Sister   . Hyperlipidemia Sister   . Breast cancer Sister 78  . Hypothyroidism Sister   . Diabetes Sister   . Aneurysm Paternal Grandfather   . Breast cancer Daughter 85    Social History:  reports that she quit smoking about 10 years ago. Her smoking use included Cigarettes. She quit after 1.00 year of use.  She has never used smokeless tobacco. She reports that she does not drink alcohol or use drugs.  She quit smoking on 11/24/2005.  The patient is alone today.  Allergies:  Allergies  Allergen Reactions  . Iohexol      Code: HIVES, Desc: CONTRAST REACTION OF HIVES (LARGE WHELPS DEVELOPED OVER ENTIRE BODY/MMS   . Prednisone Other (See Comments)    hallucinations    Current Medications: Current Outpatient Prescriptions  Medication Sig Dispense Refill  . albuterol (PROVENTIL HFA;VENTOLIN HFA) 108 (90 Base) MCG/ACT inhaler Inhale 2 puffs into the lungs every 6 (six) hours as needed for wheezing or shortness of breath. 1 Inhaler 2  . aspirin 81 MG tablet Take 81 mg by mouth  daily.    [name] atenolol (TENORMIN) 25 MG tablet Take 1 tablet (25 mg total) by mouth daily. 90 tablet 3  . atorvastatin (LIPITOR) 80 MG tablet Take 1 tablet (80 mg total) by mouth at bedtime. 90 tablet 3  . Fluticasone-Salmeterol (ADVAIR) 250-50 MCG/DOSE AEPB Inhale 1 puff into the lungs 2 (two) times daily. 60 each 11  . letrozole (FEMARA) 2.5 MG tablet Take 1 tablet (2.5 mg total) by mouth daily. 90 tablet 3  . levothyroxine (SYNTHROID, LEVOTHROID) 50 MCG tablet Take 0.5 tablets (25 mcg total) by mouth daily. 45 tablet 3  . losartan (COZAAR) 50 MG tablet Take 1 tablet (50 mg total) by mouth daily. 90 tablet 3   No current facility-administered medications for this visit.     Review of Systems:  GENERAL:  Feels good.  No fevers or sweats.  Weight up 2 pounds. PERFORMANCE STATUS (ECOG):  1 HEENT:  No visual changes, runny nose, sore throat, mouth sores or tenderness. Lungs: No shortness of breath or cough.  No hemoptysis. Cardiac:  No chest pain, palpitations, orthopnea, or PND. Breasts:  Intermittent right breast pain (see HPI). GI:  No nausea, vomiting, diarrhea, constipation, melena or hematochezia.  Cut down on salt. GU:  No urgency, frequency, dysuria, or hematuria. Musculoskeletal:  No back pain.  No joint pain.  No muscle tenderness. Extremities:  No pain or swelling. Skin:  No rashes or skin changes. Neuro:  No headache, numbness or weakness, balance or coordination issues. Endocrine:  No diabetes, thyroid issues, hot flashes or night sweats. Psych:  No mood changes, depression or anxiety. Pain:  No focal pain. Review of systems:  All other systems reviewed and found to be negative.  Physical Exam: Blood pressure 123/71, pulse (!) 57, temperature 97.3 F (36.3 C), temperature source Tympanic, resp. rate 18, weight 123 lb 6 oz (56 kg). GENERAL:  Well developed, well nourished, woman sitting comfortably in the exam room in no acute distress. MENTAL STATUS:  Alert and oriented to  person, place and time. HEAD:  Short gray hair.  Normocephalic, atraumatic, face symmetric, no Cushingoid features. EYES:  Glasses.  Brown/hazel eyes.  Pupils equal round and reactive to light and accomodation.  No conjunctivitis or scleral icterus. ENT:  Hearing aide.  Oropharynx clear without lesion.  Dentures. Tongue normal. Mucous membranes moist.  RESPIRATORY:  Clear to auscultation without rales, wheezes or rhonchi. CARDIOVASCULAR:  Regular rate and rhythm without murmur, rub or gallop. CHEST:  Median sternotomy. BREAST:  Right breast with 3 o'clock incision.  Tender fibrocystic changes medially and inferiorly.  No masses, skin changes or nipple discharge.  Left breast with fibrocystic changes superiorly at the 12 o'clock position 4-5 cm above the nipple.  No masses, skin changes or nipple discharge.  ABDOMEN:  Soft, non-tender, with active bowel sounds, and no hepatosplenomegaly.  No masses. SKIN:  No rashes, ulcers or lesions. EXTREMITIES: No edema, no skin discoloration or tenderness.  No palpable cords. LYMPH NODES: No palpable cervical, supraclavicular, axillary or inguinal adenopathy  NEUROLOGICAL: Unremarkable. PSYCH:  Appropriate.   Appointment on 08/07/2016  Component Date Value Ref Range Status  . WBC 08/07/2016 8.0  3.6 - 11.0 K/uL Final  . RBC 08/07/2016 4.35  3.80 - 5.20 MIL/uL Final  . Hemoglobin 08/07/2016 13.2  12.0 - 16.0 g/dL Final  . HCT 08/07/2016 38.1  35.0 - 47.0 % Final  . MCV 08/07/2016 87.6  80.0 - 100.0 fL Final  . MCH 08/07/2016 30.4  26.0 - 34.0 pg Final  . MCHC 08/07/2016 34.7  32.0 - 36.0 g/dL Final  . RDW 08/07/2016 14.5  11.5 - 14.5 % Final  . Platelets 08/07/2016 234  150 - 440 K/uL Final  . Neutrophils Relative % 08/07/2016 67  % Final  . Neutro Abs 08/07/2016 5.4  1.4 - 6.5 K/uL Final  . Lymphocytes Relative 08/07/2016 22  % Final  . Lymphs Abs 08/07/2016 1.7  1.0 - 3.6 K/uL Final  . Monocytes Relative 08/07/2016 8  % Final  . Monocytes  Absolute 08/07/2016 0.6  0.2 - 0.9 K/uL Final  . Eosinophils Relative 08/07/2016 2  % Final  . Eosinophils Absolute 08/07/2016 0.2  0 - 0.7 K/uL Final  . Basophils Relative 08/07/2016 1  % Final  . Basophils Absolute 08/07/2016 0.1  0 - 0.1 K/uL Final  . Sodium 08/07/2016 135  135 - 145 mmol/L Final  . Potassium 08/07/2016 4.7  3.5 - 5.1 mmol/L Final  . Chloride 08/07/2016 102  101 - 111 mmol/L Final  . CO2 08/07/2016 25  22 - 32 mmol/L Final  . Glucose, Bld 08/07/2016 104* 65 - 99 mg/dL Final  . BUN 08/07/2016 22* 6 - 20 mg/dL Final  . Creatinine, Ser 08/07/2016 0.90  0.44 - 1.00 mg/dL Final  . Calcium 08/07/2016 9.6  8.9 - 10.3 mg/dL Final  . Total Protein 08/07/2016 7.9  6.5 - 8.1 g/dL Final  . Albumin 08/07/2016 4.4  3.5 - 5.0 g/dL Final  . AST 08/07/2016 23  15 - 41 U/L Final  . ALT 08/07/2016 18  14 - 54 U/L Final  . Alkaline Phosphatase 08/07/2016 85  38 - 126 U/L Final  . Total Bilirubin 08/07/2016 0.6  0.3 - 1.2 mg/dL Final  . GFR calc non Af Amer 08/07/2016 >60  >60 mL/min Final  . GFR calc Af Amer 08/07/2016 >60  >60 mL/min Final   Comment: (NOTE) The eGFR has been calculated using the CKD EPI equation. This calculation has not been validated in all clinical situations. eGFR's persistently <60 mL/min signify possible Chronic Kidney Disease.   . Anion gap 08/07/2016 8  5 - 15 Final    Assessment:  HESPER VENTURELLA is a 76 y.o. female with stage IB right breast cancer s/p partial mastectomy on 06/02/2014.  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  Tumor was ER positive (> 90%), PR positive (1-10%), and Her2/neu negative by FISH.  Two sentinel lymph nodes were negative.  Pathologic stage was T1bN0M0.  She received 4256 cGy to the right breast from 07/01/2014 - 07/22/2014 and 1440 cGy to the scar from 07/23/2014 - 08/05/2014.    She began Femara after completing radiation.   Mammogram on 05/25/2016 was negative.  CA27.29 has been followed:  8.5 on  03/24/2016 and 6.9 on 08/07/2016.  Bone density study on 10/08/2014 revealed osteoporosis with a T score of -3.2 in the right femur and -2.4 in L1-L4.  She was briefly on weekly Fosamax.  She is not interested in any further treatment.  She has a history of non-invasive bladder cancer.  She undergoes yearly cystoscopy (next appointment 11/2015).  Symptomatically, she notes intermittent right breast pain.  Exam is stable.  Plan: 1.  Labs today:  CBC with diff, CMP, CA27.29. 2.  Review interval mammogram.  No evidence of malignancy. 3.  Continue Femara. 4.  Discuss consideration of repeat bone density study.  Patient declines. 5.  Discuss calcium and vitamin D. 6.  RTC in 6 months for MD assessment and labs (CBC with diff, CMP, CA27.29).   Lequita Asal, MD  08/07/2016, 12:18 PM

## 2016-08-07 ENCOUNTER — Inpatient Hospital Stay (HOSPITAL_BASED_OUTPATIENT_CLINIC_OR_DEPARTMENT_OTHER): Payer: Medicare HMO | Admitting: Hematology and Oncology

## 2016-08-07 ENCOUNTER — Encounter: Payer: Self-pay | Admitting: Hematology and Oncology

## 2016-08-07 ENCOUNTER — Inpatient Hospital Stay: Payer: Medicare HMO | Attending: Hematology and Oncology

## 2016-08-07 VITALS — BP 123/71 | HR 57 | Temp 97.3°F | Resp 18 | Wt 123.4 lb

## 2016-08-07 DIAGNOSIS — Z79811 Long term (current) use of aromatase inhibitors: Secondary | ICD-10-CM

## 2016-08-07 DIAGNOSIS — I10 Essential (primary) hypertension: Secondary | ICD-10-CM | POA: Diagnosis not present

## 2016-08-07 DIAGNOSIS — Z170 Estrogen receptor positive status [ER+]: Secondary | ICD-10-CM | POA: Insufficient documentation

## 2016-08-07 DIAGNOSIS — N644 Mastodynia: Secondary | ICD-10-CM | POA: Insufficient documentation

## 2016-08-07 DIAGNOSIS — Z9011 Acquired absence of right breast and nipple: Secondary | ICD-10-CM | POA: Insufficient documentation

## 2016-08-07 DIAGNOSIS — Z87891 Personal history of nicotine dependence: Secondary | ICD-10-CM | POA: Insufficient documentation

## 2016-08-07 DIAGNOSIS — I739 Peripheral vascular disease, unspecified: Secondary | ICD-10-CM | POA: Diagnosis not present

## 2016-08-07 DIAGNOSIS — I714 Abdominal aortic aneurysm, without rupture: Secondary | ICD-10-CM | POA: Diagnosis not present

## 2016-08-07 DIAGNOSIS — C50111 Malignant neoplasm of central portion of right female breast: Secondary | ICD-10-CM

## 2016-08-07 DIAGNOSIS — E785 Hyperlipidemia, unspecified: Secondary | ICD-10-CM | POA: Insufficient documentation

## 2016-08-07 DIAGNOSIS — J449 Chronic obstructive pulmonary disease, unspecified: Secondary | ICD-10-CM | POA: Insufficient documentation

## 2016-08-07 DIAGNOSIS — Z7982 Long term (current) use of aspirin: Secondary | ICD-10-CM | POA: Diagnosis not present

## 2016-08-07 DIAGNOSIS — Z923 Personal history of irradiation: Secondary | ICD-10-CM | POA: Insufficient documentation

## 2016-08-07 DIAGNOSIS — I252 Old myocardial infarction: Secondary | ICD-10-CM | POA: Insufficient documentation

## 2016-08-07 DIAGNOSIS — M818 Other osteoporosis without current pathological fracture: Secondary | ICD-10-CM | POA: Diagnosis not present

## 2016-08-07 DIAGNOSIS — M810 Age-related osteoporosis without current pathological fracture: Secondary | ICD-10-CM

## 2016-08-07 DIAGNOSIS — E039 Hypothyroidism, unspecified: Secondary | ICD-10-CM | POA: Insufficient documentation

## 2016-08-07 DIAGNOSIS — C50911 Malignant neoplasm of unspecified site of right female breast: Secondary | ICD-10-CM | POA: Diagnosis not present

## 2016-08-07 DIAGNOSIS — I2510 Atherosclerotic heart disease of native coronary artery without angina pectoris: Secondary | ICD-10-CM | POA: Insufficient documentation

## 2016-08-07 DIAGNOSIS — Z951 Presence of aortocoronary bypass graft: Secondary | ICD-10-CM | POA: Diagnosis not present

## 2016-08-07 DIAGNOSIS — M1990 Unspecified osteoarthritis, unspecified site: Secondary | ICD-10-CM | POA: Insufficient documentation

## 2016-08-07 LAB — COMPREHENSIVE METABOLIC PANEL WITH GFR
ALT: 18 U/L (ref 14–54)
AST: 23 U/L (ref 15–41)
Albumin: 4.4 g/dL (ref 3.5–5.0)
Alkaline Phosphatase: 85 U/L (ref 38–126)
Anion gap: 8 (ref 5–15)
BUN: 22 mg/dL — ABNORMAL HIGH (ref 6–20)
CO2: 25 mmol/L (ref 22–32)
Calcium: 9.6 mg/dL (ref 8.9–10.3)
Chloride: 102 mmol/L (ref 101–111)
Creatinine, Ser: 0.9 mg/dL (ref 0.44–1.00)
GFR calc Af Amer: >60 mL/min
GFR calc non Af Amer: >60 mL/min
Glucose, Bld: 104 mg/dL — ABNORMAL HIGH (ref 65–99)
Potassium: 4.7 mmol/L (ref 3.5–5.1)
Sodium: 135 mmol/L (ref 135–145)
Total Bilirubin: 0.6 mg/dL (ref 0.3–1.2)
Total Protein: 7.9 g/dL (ref 6.5–8.1)

## 2016-08-07 LAB — CBC WITH DIFFERENTIAL/PLATELET
Basophils Absolute: 0.1 10*3/uL (ref 0–0.1)
Basophils Relative: 1 %
Eosinophils Absolute: 0.2 10*3/uL (ref 0–0.7)
Eosinophils Relative: 2 %
HCT: 38.1 % (ref 35.0–47.0)
Hemoglobin: 13.2 g/dL (ref 12.0–16.0)
Lymphocytes Relative: 22 %
Lymphs Abs: 1.7 10*3/uL (ref 1.0–3.6)
MCH: 30.4 pg (ref 26.0–34.0)
MCHC: 34.7 g/dL (ref 32.0–36.0)
MCV: 87.6 fL (ref 80.0–100.0)
Monocytes Absolute: 0.6 10*3/uL (ref 0.2–0.9)
Monocytes Relative: 8 %
Neutro Abs: 5.4 10*3/uL (ref 1.4–6.5)
Neutrophils Relative %: 67 %
Platelets: 234 10*3/uL (ref 150–440)
RBC: 4.35 MIL/uL (ref 3.80–5.20)
RDW: 14.5 % (ref 11.5–14.5)
WBC: 8 10*3/uL (ref 3.6–11.0)

## 2016-08-07 NOTE — Progress Notes (Signed)
Patient states she is having pain in her right breast about mid way that radiates into the center of her chest.  Describes it as grabbing pain that comes and goes.

## 2016-08-08 DIAGNOSIS — H2513 Age-related nuclear cataract, bilateral: Secondary | ICD-10-CM | POA: Diagnosis not present

## 2016-08-08 DIAGNOSIS — H16223 Keratoconjunctivitis sicca, not specified as Sjogren's, bilateral: Secondary | ICD-10-CM | POA: Diagnosis not present

## 2016-08-11 ENCOUNTER — Ambulatory Visit (INDEPENDENT_AMBULATORY_CARE_PROVIDER_SITE_OTHER): Payer: Medicare HMO | Admitting: Family Medicine

## 2016-08-11 ENCOUNTER — Encounter: Payer: Self-pay | Admitting: Family Medicine

## 2016-08-11 VITALS — BP 126/55 | HR 60 | Temp 98.4°F | Ht 66.0 in | Wt 125.2 lb

## 2016-08-11 DIAGNOSIS — J449 Chronic obstructive pulmonary disease, unspecified: Secondary | ICD-10-CM

## 2016-08-11 NOTE — Progress Notes (Signed)
Pre visit review using our clinic review tool, if applicable. No additional management support is needed unless otherwise documented below in the visit note. 

## 2016-08-11 NOTE — Assessment & Plan Note (Addendum)
Symptoms well controlled on ADVAIR. Spirometry today.. Does not reflect how pt feels. Will continue advair alone unless pt has worsening symptoms or frequent infection.  Use albuterol prn.

## 2016-08-11 NOTE — Patient Instructions (Signed)
Continue Advair  1 puff twice daily  EVERY DAY For control of COPD. Can use albuterol as needed for rescue.

## 2016-08-11 NOTE — Progress Notes (Signed)
   Subjective:    Patient ID: Amy Stone, female    DOB: 1940-08-25, 76 y.o.   MRN: [id number]  HPI  76 year old female presents for follow up COPD.  At last OV for CPX on 1/26 she was unable to afford Spiriva. Started on Advair 250/50 1 puff BID as controlled.  Albuterol prn for rescue. Non new SE  Feels symptoms are well controlled:good Using medications without problems: yes Night time symptoms:none ER visits since last visit:none Missed work or school:none Sputum production at baseline:none Increased cough: no, but daily cough, she feels from dry throat Increased SOB: none Using O2: no  Last spirometry 2010.  Former smoker Quit 11 years ago, 8 Years but 1/2 pack per day: 25 pack year history.  Not interested in lung cancer screening program. Review of Systems  Constitutional: Negative for fatigue and fever.  HENT: Negative for ear pain.   Eyes: Negative for pain.  Respiratory: Negative for chest tightness and shortness of breath.   Cardiovascular: Negative for chest pain, palpitations and leg swelling.  Gastrointestinal: Negative for abdominal pain.  Genitourinary: Negative for dysuria.   Blood pressure (!) 126/55, pulse 60, temperature 98.4 F (36.9 C), temperature source Oral, height '5\' 6"'$  (1.676 m), weight 125 lb 4 oz (56.8 kg), SpO2 95 %.     Objective:   Physical Exam  Constitutional: Vital signs are normal. She appears well-developed and well-nourished. She is cooperative.  Non-toxic appearance. She does not appear ill. No distress.  Elderly female in NAD  HENT:  Head: Normocephalic.  Right Ear: Hearing, tympanic membrane, external ear and ear canal normal. Tympanic membrane is not erythematous, not retracted and not bulging.  Left Ear: Hearing, tympanic membrane, external ear and ear canal normal. Tympanic membrane is not erythematous, not retracted and not bulging.  Nose: No mucosal edema or rhinorrhea. Right sinus exhibits no maxillary sinus  tenderness and no frontal sinus tenderness. Left sinus exhibits no maxillary sinus tenderness and no frontal sinus tenderness.  Mouth/Throat: Uvula is midline, oropharynx is clear and moist and mucous membranes are normal.  Eyes: Conjunctivae, EOM and lids are normal. Pupils are equal, round, and reactive to light. Lids are everted and swept, no foreign bodies found.  Neck: Trachea normal and normal range of motion. Neck supple. Carotid bruit is not present. No thyroid mass and no thyromegaly present.  Cardiovascular: Normal rate, regular rhythm, S1 normal, S2 normal, normal heart sounds, intact distal pulses and normal pulses.  Exam reveals no gallop and no friction rub.   No murmur heard. Pulmonary/Chest: Effort normal. No tachypnea. No respiratory distress. She has decreased breath sounds. She has no wheezes. She has no rhonchi. She has no rales.  Abdominal: Soft. Normal appearance and bowel sounds are normal. There is no tenderness.  Neurological: She is alert.  Skin: Skin is warm, dry and intact. No rash noted.  Psychiatric: Her speech is normal and behavior is normal. Judgment and thought content normal. Her mood appears not anxious. Cognition and memory are normal. She does not exhibit a depressed mood.          Assessment & Plan:

## 2016-08-23 LAB — CANCER ANTIGEN 27.29: CA 27.29: 6.9 U/mL (ref 0.0–38.6)

## 2016-08-26 ENCOUNTER — Encounter: Payer: Self-pay | Admitting: Hematology and Oncology

## 2016-08-28 DIAGNOSIS — H2513 Age-related nuclear cataract, bilateral: Secondary | ICD-10-CM | POA: Diagnosis not present

## 2016-09-13 ENCOUNTER — Encounter: Payer: Self-pay | Admitting: *Deleted

## 2016-09-14 NOTE — Discharge Instructions (Signed)

## 2016-09-18 ENCOUNTER — Ambulatory Visit: Payer: Medicare HMO | Admitting: Anesthesiology

## 2016-09-18 ENCOUNTER — Ambulatory Visit
Admission: RE | Admit: 2016-09-18 | Discharge: 2016-09-18 | Disposition: A | Discharge status: Home / Self Care | Payer: Medicare HMO | Source: Ambulatory Visit | Attending: Ophthalmology | Admitting: Ophthalmology

## 2016-09-18 ENCOUNTER — Encounter
Admission: RE | Disposition: A | Discharge status: Home / Self Care | Payer: Self-pay | Source: Ambulatory Visit | Attending: Ophthalmology

## 2016-09-18 DIAGNOSIS — Z79899 Other long term (current) drug therapy: Secondary | ICD-10-CM | POA: Insufficient documentation

## 2016-09-18 DIAGNOSIS — Z9889 Other specified postprocedural states: Secondary | ICD-10-CM | POA: Insufficient documentation

## 2016-09-18 DIAGNOSIS — Z853 Personal history of malignant neoplasm of breast: Secondary | ICD-10-CM | POA: Diagnosis not present

## 2016-09-18 DIAGNOSIS — Z951 Presence of aortocoronary bypass graft: Secondary | ICD-10-CM | POA: Diagnosis not present

## 2016-09-18 DIAGNOSIS — Z7951 Long term (current) use of inhaled steroids: Secondary | ICD-10-CM | POA: Diagnosis not present

## 2016-09-18 DIAGNOSIS — Z87891 Personal history of nicotine dependence: Secondary | ICD-10-CM | POA: Diagnosis not present

## 2016-09-18 DIAGNOSIS — E890 Postprocedural hypothyroidism: Secondary | ICD-10-CM | POA: Diagnosis not present

## 2016-09-18 DIAGNOSIS — Z8551 Personal history of malignant neoplasm of bladder: Secondary | ICD-10-CM | POA: Diagnosis not present

## 2016-09-18 DIAGNOSIS — Z888 Allergy status to other drugs, medicaments and biological substances status: Secondary | ICD-10-CM | POA: Diagnosis not present

## 2016-09-18 DIAGNOSIS — Z7982 Long term (current) use of aspirin: Secondary | ICD-10-CM | POA: Insufficient documentation

## 2016-09-18 DIAGNOSIS — E7800 Pure hypercholesterolemia, unspecified: Secondary | ICD-10-CM | POA: Insufficient documentation

## 2016-09-18 DIAGNOSIS — J449 Chronic obstructive pulmonary disease, unspecified: Secondary | ICD-10-CM | POA: Diagnosis not present

## 2016-09-18 DIAGNOSIS — I10 Essential (primary) hypertension: Secondary | ICD-10-CM | POA: Diagnosis not present

## 2016-09-18 DIAGNOSIS — I252 Old myocardial infarction: Secondary | ICD-10-CM | POA: Diagnosis not present

## 2016-09-18 DIAGNOSIS — I739 Peripheral vascular disease, unspecified: Secondary | ICD-10-CM | POA: Insufficient documentation

## 2016-09-18 DIAGNOSIS — I2510 Atherosclerotic heart disease of native coronary artery without angina pectoris: Secondary | ICD-10-CM | POA: Insufficient documentation

## 2016-09-18 DIAGNOSIS — Z90710 Acquired absence of both cervix and uterus: Secondary | ICD-10-CM | POA: Diagnosis not present

## 2016-09-18 DIAGNOSIS — H2513 Age-related nuclear cataract, bilateral: Secondary | ICD-10-CM | POA: Diagnosis not present

## 2016-09-18 DIAGNOSIS — H2512 Age-related nuclear cataract, left eye: Secondary | ICD-10-CM | POA: Diagnosis not present

## 2016-09-18 DIAGNOSIS — Z91041 Radiographic dye allergy status: Secondary | ICD-10-CM | POA: Insufficient documentation

## 2016-09-18 HISTORY — PX: CATARACT EXTRACTION W/PHACO: SHX586

## 2016-09-18 HISTORY — DX: Presence of dental prosthetic device (complete) (partial): Z97.2

## 2016-09-18 SURGERY — PHACOEMULSIFICATION, CATARACT, WITH IOL INSERTION
Anesthesia: Monitor Anesthesia Care | Site: Eye | Laterality: Left | Wound class: Clean

## 2016-09-18 MED ORDER — ARMC OPHTHALMIC DILATING DROPS: 1.0000 "application " | OPHTHALMIC | Status: DC | PRN

## 2016-09-18 MED ORDER — ARMC OPHTHALMIC DILATING DROPS
1.0000 "application " | Freq: Three times a day (TID) | OPHTHALMIC | Status: AC
  Administered 2016-09-18 (×3): 1 via OPHTHALMIC

## 2016-09-18 MED ORDER — FENTANYL CITRATE (PF) 100 MCG/2ML IJ SOLN
INTRAMUSCULAR | Status: DC | PRN
  Administered 2016-09-18: 50 ug via INTRAVENOUS

## 2016-09-18 MED ORDER — NA HYALUR & NA CHOND-NA HYALUR 0.4-0.35 ML IO KIT
PACK | INTRAOCULAR | Status: DC | PRN
  Administered 2016-09-18: 1 mL via INTRAOCULAR

## 2016-09-18 MED ORDER — ACETAMINOPHEN 325 MG PO TABS: 325.0000 mg | ORAL_TABLET | ORAL | Status: DC | PRN

## 2016-09-18 MED ORDER — BALANCED SALT IO SOLN
INTRAOCULAR | Status: DC | PRN
  Administered 2016-09-18: 1 mL via INTRAOCULAR

## 2016-09-18 MED ORDER — LACTATED RINGERS IV SOLN: INTRAVENOUS | Status: DC

## 2016-09-18 MED ORDER — BRIMONIDINE TARTRATE-TIMOLOL 0.2-0.5 % OP SOLN
OPHTHALMIC | Status: DC | PRN
  Administered 2016-09-18: 1 [drp] via OPHTHALMIC

## 2016-09-18 MED ORDER — EPINEPHRINE PF 1 MG/ML IJ SOLN
INTRAOCULAR | Status: DC | PRN
  Administered 2016-09-18: 52 mL via OPHTHALMIC

## 2016-09-18 MED ORDER — CEFUROXIME OPHTHALMIC INJECTION 1 MG/0.1 ML
INJECTION | OPHTHALMIC | Status: DC | PRN
  Administered 2016-09-18: .3 mL via OPHTHALMIC

## 2016-09-18 MED ORDER — MOXIFLOXACIN HCL 0.5 % OP SOLN
1.0000 [drp] | OPHTHALMIC | Status: DC | PRN
  Administered 2016-09-18 (×3): 1 [drp] via OPHTHALMIC

## 2016-09-18 MED ORDER — ACETAMINOPHEN 160 MG/5ML PO SOLN: 325.0000 mg | ORAL | Status: DC | PRN

## 2016-09-18 MED ORDER — MIDAZOLAM HCL 2 MG/2ML IJ SOLN
INTRAMUSCULAR | Status: DC | PRN
  Administered 2016-09-18: 2 mg via INTRAVENOUS

## 2016-09-18 SURGICAL SUPPLY — 27 items
CANNULA ANT/CHMB 27G (MISCELLANEOUS) ×1 IMPLANT
CANNULA ANT/CHMB 27GA (MISCELLANEOUS) ×3 IMPLANT
CARTRIDGE ABBOTT (MISCELLANEOUS) IMPLANT
GLOVE SURG LX 7.5 STRW (GLOVE) ×2
GLOVE SURG LX STRL 7.5 STRW (GLOVE) ×1 IMPLANT
GLOVE SURG TRIUMPH 8.0 PF LTX (GLOVE) ×3 IMPLANT
GOWN STRL REUS W/ TWL LRG LVL3 (GOWN DISPOSABLE) ×2 IMPLANT
GOWN STRL REUS W/TWL LRG LVL3 (GOWN DISPOSABLE) ×6
LENS IOL TECNIS ITEC 29.5 (Intraocular Lens) ×2 IMPLANT
MARKER SKIN DUAL TIP RULER LAB (MISCELLANEOUS) ×3 IMPLANT
NDL FILTER BLUNT 18X1 1/2 (NEEDLE) ×1 IMPLANT
NDL RETROBULBAR .5 NSTRL (NEEDLE) IMPLANT
NEEDLE FILTER BLUNT 18X 1/2SAF (NEEDLE) ×2
NEEDLE FILTER BLUNT 18X1 1/2 (NEEDLE) ×1 IMPLANT
PACK CATARACT BRASINGTON (MISCELLANEOUS) ×3 IMPLANT
PACK EYE AFTER SURG (MISCELLANEOUS) ×3 IMPLANT
PACK OPTHALMIC (MISCELLANEOUS) ×3 IMPLANT
RING MALYGIN 7.0 (MISCELLANEOUS) IMPLANT
SUT ETHILON 10-0 CS-B-6CS-B-6 (SUTURE)
SUT VICRYL  9 0 (SUTURE)
SUT VICRYL 9 0 (SUTURE) IMPLANT
SUTURE EHLN 10-0 CS-B-6CS-B-6 (SUTURE) IMPLANT
SYR 3ML LL SCALE MARK (SYRINGE) ×3 IMPLANT
SYR 5ML LL (SYRINGE) ×3 IMPLANT
SYR TB 1ML LUER SLIP (SYRINGE) ×3 IMPLANT
WATER STERILE IRR 250ML POUR (IV SOLUTION) ×3 IMPLANT
WIPE NON LINTING 3.25X3.25 (MISCELLANEOUS) ×3 IMPLANT

## 2016-09-18 NOTE — Anesthesia Postprocedure Evaluation (Signed)
Anesthesia Post Note  Patient: Amy Stone  Procedure(s) Performed: Procedure(s) (LRB): CATARACT EXTRACTION PHACO AND INTRAOCULAR LENS PLACEMENT (IOC) Left (Left)  Patient location during evaluation: PACU Anesthesia Type: MAC Level of consciousness: awake and alert Pain management: pain level controlled Vital Signs Assessment: post-procedure vital signs reviewed and stable Respiratory status: spontaneous breathing, nonlabored ventilation, respiratory function stable and patient connected to nasal cannula oxygen Cardiovascular status: stable and blood pressure returned to baseline Anesthetic complications: no    Trecia Rogers

## 2016-09-18 NOTE — Op Note (Signed)
OPERATIVE NOTE  VERMELL MADRID [id number] 09/18/2016   PREOPERATIVE DIAGNOSIS:  Nuclear sclerotic cataract left eye. H25.12   POSTOPERATIVE DIAGNOSIS:    Nuclear sclerotic cataract left eye.     PROCEDURE:  Phacoemusification with posterior chamber intraocular lens placement of the left eye   LENS:   Implant Name Type Inv. Item Serial No. Manufacturer Lot No. LRB No. Used  LENS IOL DIOP 29.5 - [id number] Intraocular Lens LENS IOL DIOP 29.5 [id number] AMO   Left 1        ULTRASOUND TIME: 13  % of 0 minutes 59 seconds, CDE 10.0  SURGEON:  Wyonia Hough, MD   ANESTHESIA:  Topical with tetracaine drops and 2% Xylocaine jelly, augmented with 1% preservative-free intracameral lidocaine.    COMPLICATIONS:  None.   DESCRIPTION OF PROCEDURE:  The patient was identified in the holding room and transported to the operating room and placed in the supine position under the operating microscope.  The left eye was identified as the operative eye and it was prepped and draped in the usual sterile ophthalmic fashion.   A 1 millimeter clear-corneal paracentesis was made at the 1:30 position.  0.5 ml of preservative-free 1% lidocaine was injected into the anterior chamber.  The anterior chamber was filled with Viscoat viscoelastic.  A 2.4 millimeter keratome was used to make a near-clear corneal incision at the 10:30 position.  .  A curvilinear capsulorrhexis was made with a cystotome and capsulorrhexis forceps.  Balanced salt solution was used to hydrodissect and hydrodelineate the nucleus.   Phacoemulsification was then used in stop and chop fashion to remove the lens nucleus and epinucleus.  The remaining cortex was then removed using the irrigation and aspiration handpiece. Provisc was then placed into the capsular bag to distend it for lens placement.  A lens was then injected into the capsular bag.  The remaining viscoelastic was aspirated.   Wounds were hydrated with balanced salt  solution.  The anterior chamber was inflated to a physiologic pressure with balanced salt solution.  No wound leaks were noted. Cefuroxime 0.1 ml of a 10mg /ml solution was injected into the anterior chamber for a dose of 1 mg of intracameral antibiotic at the completion of the case.   Timolol and Brimonidine drops were applied to the eye.  The patient was taken to the recovery room in stable condition without complications of anesthesia or surgery.  Sheryle Vice 09/18/2016, 8:30 AM

## 2016-09-18 NOTE — Transfer of Care (Signed)
Immediate Anesthesia Transfer of Care Note  Patient: Amy Stone  Procedure(s) Performed: Procedure(s): CATARACT EXTRACTION PHACO AND INTRAOCULAR LENS PLACEMENT (IOC) Left (Left)  Patient Location: PACU  Anesthesia Type: MAC  Level of Consciousness: awake, alert  and patient cooperative  Airway and Oxygen Therapy: Patient Spontanous Breathing and Patient connected to supplemental oxygen  Post-op Assessment: Post-op Vital signs reviewed, Patient's Cardiovascular Status Stable, Respiratory Function Stable, Patent Airway and No signs of Nausea or vomiting  Post-op Vital Signs: Reviewed and stable  Complications: No apparent anesthesia complications

## 2016-09-18 NOTE — Anesthesia Preprocedure Evaluation (Signed)
Anesthesia Evaluation  Patient identified by MRN, date of birth, ID band Patient awake    Reviewed: Allergy & Precautions, H&P , NPO status , Patient's Chart, lab work & pertinent test results, reviewed documented beta blocker date and time   History of Anesthesia Complications (+) PONV and history of anesthetic complications  Airway Mallampati: I  TM Distance: >3 FB Neck ROM: full    Dental  (+) Upper Dentures, Lower Dentures   Pulmonary shortness of breath, COPD, former smoker,  Stable symptoms.  Used inhaler this morning.   Pulmonary exam normal breath sounds clear to auscultation       Cardiovascular Exercise Tolerance: Poor hypertension, + CAD, + Past MI and + Peripheral Vascular Disease  Normal cardiovascular exam Rhythm:regular Rate:Normal  Doing ok.  No recent changes in symptoms.   Neuro/Psych negative neurological ROS  negative psych ROS   GI/Hepatic negative GI ROS, Neg liver ROS,   Endo/Other  Hypothyroidism   Renal/GU negative Renal ROS  negative genitourinary   Musculoskeletal   Abdominal   Peds  Hematology negative hematology ROS (+)   Anesthesia Other Findings   Reproductive/Obstetrics negative OB ROS                             Anesthesia Physical Anesthesia Plan  ASA: III  Anesthesia Plan: MAC   Post-op Pain Management:    Induction:   Airway Management Planned:   Additional Equipment:   Intra-op Plan:   Post-operative Plan:   Informed Consent: I have reviewed the patients History and Physical, chart, labs and discussed the procedure including the risks, benefits and alternatives for the proposed anesthesia with the patient or authorized representative who has indicated his/her understanding and acceptance.   Dental Advisory Given  Plan Discussed with: CRNA and Anesthesiologist  Anesthesia Plan Comments:         Anesthesia Quick Evaluation

## 2016-09-18 NOTE — H&P (Signed)
The History and Physical notes are on paper, have been signed, and are to be scanned. The patient remains stable and unchanged from the H&P.   Previous H&P reviewed, patient examined, and there are no changes.  Amy Stone 09/18/2016 7:43 AM

## 2016-09-18 NOTE — Transfer of Care (Signed)
Immediate Anesthesia Transfer of Care Note  Patient: Amy Stone  Procedure(s) Performed: Procedure(s): CATARACT EXTRACTION PHACO AND INTRAOCULAR LENS PLACEMENT (IOC) Left (Left)  Patient Location: PACU  Anesthesia Type: MAC  Level of Consciousness: awake, alert  and patient cooperative  Airway and Oxygen Therapy: Patient Spontanous Breathing and Patient connected to supplemental oxygen  Post-op Assessment: Post-op Vital signs reviewed, Patient's Cardiovascular Status Stable, Respiratory Function Stable, Patent Airway and No signs of Nausea or vomiting  Post-op Vital Signs: Reviewed and stable  Complications: No apparent anesthesia complications  

## 2016-09-18 NOTE — Anesthesia Procedure Notes (Signed)
Procedure Name: MAC Performed by: Kaesyn Johnston Pre-anesthesia Checklist: Patient identified, Emergency Drugs available, Suction available, Timeout performed and Patient being monitored Patient Re-evaluated:Patient Re-evaluated prior to inductionOxygen Delivery Method: Nasal cannula Placement Confirmation: positive ETCO2     

## 2016-09-19 ENCOUNTER — Encounter: Payer: Self-pay | Admitting: Ophthalmology

## 2016-10-17 ENCOUNTER — Other Ambulatory Visit: Payer: Self-pay | Admitting: Family Medicine

## 2016-11-08 ENCOUNTER — Ambulatory Visit
Admission: RE | Admit: 2016-11-08 | Discharge: 2016-11-08 | Disposition: A | Discharge status: Home / Self Care | Payer: Medicare HMO | Source: Ambulatory Visit | Attending: Radiation Oncology | Admitting: Radiation Oncology

## 2016-11-08 VITALS — BP 125/65 | HR 53 | Temp 97.2°F | Resp 16 | Ht 66.0 in | Wt 124.0 lb

## 2016-11-08 DIAGNOSIS — Z923 Personal history of irradiation: Secondary | ICD-10-CM | POA: Insufficient documentation

## 2016-11-08 DIAGNOSIS — C50111 Malignant neoplasm of central portion of right female breast: Secondary | ICD-10-CM | POA: Insufficient documentation

## 2016-11-08 DIAGNOSIS — C50121 Malignant neoplasm of central portion of right male breast: Secondary | ICD-10-CM

## 2016-11-08 DIAGNOSIS — Z79811 Long term (current) use of aromatase inhibitors: Secondary | ICD-10-CM | POA: Insufficient documentation

## 2016-11-08 DIAGNOSIS — Z170 Estrogen receptor positive status [ER+]: Secondary | ICD-10-CM | POA: Insufficient documentation

## 2016-11-08 NOTE — Progress Notes (Signed)
Radiation Oncology Follow up Note  Name: Amy Stone   Date:   11/08/2016 MRN:  [id number] DOB: [birth year]    This 76 y.o. female presents to the clinic today for 2 year follow-up status post radiation therapy to her right breast for stage IA invasive mammary carcinoma.  REFERRING PROVIDER: Jinny Sanders, MD  HPI: Patient is a 76 year old female now out 2 years having completed radiation therapy to her right breast for stage I a invasive mammary carcinoma ER/PR positive HER-2/neu negative. Seen today in routine follow-up she is doing well. She specifically denies breast tenderness cough or bone pain.[name] Her last mammogram was back in February was a BI-RADS 2 benign. She's currently on Femara tolerating that well without side effect.  COMPLICATIONS OF TREATMENT: none  FOLLOW UP COMPLIANCE: keeps appointments   PHYSICAL EXAM:  BP 125/65 (BP Location: Left Arm, Patient Position: Sitting, Cuff Size: Small)   Pulse (!) 53   Temp (!) 97.2 F (36.2 C) (Tympanic)   Resp 16   Ht 5' 6"  (1.676 m)   Wt 124 lb 0.1 oz (56.3 kg)   BMI 20.02 kg/m  Lungs are clear to A&P cardiac examination essentially unremarkable with regular rate and rhythm. No dominant mass or nodularity is noted in either breast in 2 positions examined. Incision is well-healed. No axillary or supraclavicular adenopathy is appreciated. Cosmetic result is excellent. Well-developed well-nourished patient in NAD. HEENT reveals PERLA, EOMI, discs not visualized.  Oral cavity is clear. No oral mucosal lesions are identified. Neck is clear without evidence of cervical or supraclavicular adenopathy. Lungs are clear to A&P. Cardiac examination is essentially unremarkable with regular rate and rhythm without murmur rub or thrill. Abdomen is benign with no organomegaly or masses noted. Motor sensory and DTR levels are equal and symmetric in the upper and lower extremities. Cranial nerves II through XII are grossly intact. Proprioception is  intact. No peripheral adenopathy or edema is identified. No motor or sensory levels are noted. Crude visual fields are within normal range.  RADIOLOGY RESULTS: Mammograms are reviewed and compatible with the above-stated findings  PLAN: At the present time patient is doing well with no evidence of disease. She scheduled for follow-up mammograms in February. She continues on Femara without side effect. I have asked to see her back in 1 year for follow-up. She knows to call sooner with any concerns.  I would like to take this opportunity to thank you for allowing me to participate in the care of your patient.Armstead Peaks., MD

## 2016-11-08 NOTE — Progress Notes (Signed)
Patient here for follow up. She states that her right breast continues to have constant pain.

## 2016-11-16 DIAGNOSIS — H25011 Cortical age-related cataract, right eye: Secondary | ICD-10-CM | POA: Diagnosis not present

## 2016-11-20 ENCOUNTER — Encounter: Payer: Self-pay | Admitting: *Deleted

## 2016-11-27 NOTE — Discharge Instructions (Signed)

## 2016-11-29 ENCOUNTER — Ambulatory Visit: Payer: Medicare HMO | Admitting: Anesthesiology

## 2016-11-29 ENCOUNTER — Ambulatory Visit
Admission: RE | Admit: 2016-11-29 | Discharge: 2016-11-29 | Disposition: A | Discharge status: Home / Self Care | Payer: Medicare HMO | Source: Ambulatory Visit | Attending: Ophthalmology | Admitting: Ophthalmology

## 2016-11-29 ENCOUNTER — Encounter
Admission: RE | Disposition: A | Discharge status: Home / Self Care | Payer: Self-pay | Source: Ambulatory Visit | Attending: Ophthalmology

## 2016-11-29 DIAGNOSIS — H9193 Unspecified hearing loss, bilateral: Secondary | ICD-10-CM | POA: Insufficient documentation

## 2016-11-29 DIAGNOSIS — Z8551 Personal history of malignant neoplasm of bladder: Secondary | ICD-10-CM | POA: Diagnosis not present

## 2016-11-29 DIAGNOSIS — I739 Peripheral vascular disease, unspecified: Secondary | ICD-10-CM | POA: Insufficient documentation

## 2016-11-29 DIAGNOSIS — E890 Postprocedural hypothyroidism: Secondary | ICD-10-CM | POA: Diagnosis not present

## 2016-11-29 DIAGNOSIS — I2510 Atherosclerotic heart disease of native coronary artery without angina pectoris: Secondary | ICD-10-CM | POA: Diagnosis not present

## 2016-11-29 DIAGNOSIS — Z91041 Radiographic dye allergy status: Secondary | ICD-10-CM | POA: Diagnosis not present

## 2016-11-29 DIAGNOSIS — Z9842 Cataract extraction status, left eye: Secondary | ICD-10-CM | POA: Diagnosis not present

## 2016-11-29 DIAGNOSIS — E7800 Pure hypercholesterolemia, unspecified: Secondary | ICD-10-CM | POA: Insufficient documentation

## 2016-11-29 DIAGNOSIS — J449 Chronic obstructive pulmonary disease, unspecified: Secondary | ICD-10-CM | POA: Diagnosis not present

## 2016-11-29 DIAGNOSIS — H2511 Age-related nuclear cataract, right eye: Secondary | ICD-10-CM | POA: Diagnosis not present

## 2016-11-29 DIAGNOSIS — Z853 Personal history of malignant neoplasm of breast: Secondary | ICD-10-CM | POA: Diagnosis not present

## 2016-11-29 DIAGNOSIS — I252 Old myocardial infarction: Secondary | ICD-10-CM | POA: Diagnosis not present

## 2016-11-29 DIAGNOSIS — Z8679 Personal history of other diseases of the circulatory system: Secondary | ICD-10-CM | POA: Diagnosis not present

## 2016-11-29 DIAGNOSIS — Z90710 Acquired absence of both cervix and uterus: Secondary | ICD-10-CM | POA: Insufficient documentation

## 2016-11-29 DIAGNOSIS — I10 Essential (primary) hypertension: Secondary | ICD-10-CM | POA: Insufficient documentation

## 2016-11-29 DIAGNOSIS — Z951 Presence of aortocoronary bypass graft: Secondary | ICD-10-CM | POA: Diagnosis not present

## 2016-11-29 DIAGNOSIS — Z888 Allergy status to other drugs, medicaments and biological substances status: Secondary | ICD-10-CM | POA: Insufficient documentation

## 2016-11-29 DIAGNOSIS — E049 Nontoxic goiter, unspecified: Secondary | ICD-10-CM | POA: Diagnosis not present

## 2016-11-29 DIAGNOSIS — Z87891 Personal history of nicotine dependence: Secondary | ICD-10-CM | POA: Insufficient documentation

## 2016-11-29 DIAGNOSIS — H2513 Age-related nuclear cataract, bilateral: Secondary | ICD-10-CM | POA: Diagnosis not present

## 2016-11-29 HISTORY — PX: CATARACT EXTRACTION W/PHACO: SHX586

## 2016-11-29 SURGERY — PHACOEMULSIFICATION, CATARACT, WITH IOL INSERTION
Anesthesia: Monitor Anesthesia Care | Laterality: Right | Wound class: Clean

## 2016-11-29 MED ORDER — NA HYALUR & NA CHOND-NA HYALUR 0.4-0.35 ML IO KIT
PACK | INTRAOCULAR | Status: DC | PRN
  Administered 2016-11-29: 1 mL via INTRAOCULAR

## 2016-11-29 MED ORDER — BSS IO SOLN
INTRAOCULAR | Status: DC | PRN
  Administered 2016-11-29: 56 mL via OPHTHALMIC

## 2016-11-29 MED ORDER — LIDOCAINE HCL (PF) 2 % IJ SOLN
INTRAOCULAR | Status: DC | PRN
  Administered 2016-11-29: 1 mL via INTRAOCULAR

## 2016-11-29 MED ORDER — CEFUROXIME OPHTHALMIC INJECTION 1 MG/0.1 ML
INJECTION | OPHTHALMIC | Status: DC | PRN
  Administered 2016-11-29: 0.1 mL via OPHTHALMIC

## 2016-11-29 MED ORDER — ACETAMINOPHEN 325 MG PO TABS: 325.0000 mg | ORAL_TABLET | ORAL | Status: DC | PRN

## 2016-11-29 MED ORDER — FENTANYL CITRATE (PF) 100 MCG/2ML IJ SOLN
INTRAMUSCULAR | Status: DC | PRN
  Administered 2016-11-29: 50 ug via INTRAVENOUS

## 2016-11-29 MED ORDER — LACTATED RINGERS IV SOLN: INTRAVENOUS | Status: DC

## 2016-11-29 MED ORDER — MOXIFLOXACIN HCL 0.5 % OP SOLN
1.0000 [drp] | OPHTHALMIC | Status: DC | PRN
  Administered 2016-11-29 (×3): 1 [drp] via OPHTHALMIC

## 2016-11-29 MED ORDER — ACETAMINOPHEN 160 MG/5ML PO SOLN: 325.0000 mg | ORAL | Status: DC | PRN

## 2016-11-29 MED ORDER — MIDAZOLAM HCL 2 MG/2ML IJ SOLN
INTRAMUSCULAR | Status: DC | PRN
  Administered 2016-11-29: 1.5 mg via INTRAVENOUS

## 2016-11-29 MED ORDER — BRIMONIDINE TARTRATE-TIMOLOL 0.2-0.5 % OP SOLN
OPHTHALMIC | Status: DC | PRN
  Administered 2016-11-29: 1 [drp] via OPHTHALMIC

## 2016-11-29 MED ORDER — ARMC OPHTHALMIC DILATING DROPS
1.0000 "application " | OPHTHALMIC | Status: DC | PRN
  Administered 2016-11-29 (×3): 1 via OPHTHALMIC

## 2016-11-29 SURGICAL SUPPLY — 27 items
CANNULA ANT/CHMB 27G (MISCELLANEOUS) ×1 IMPLANT
CANNULA ANT/CHMB 27GA (MISCELLANEOUS) ×3 IMPLANT
CARTRIDGE ABBOTT (MISCELLANEOUS) IMPLANT
GLOVE SURG LX 7.5 STRW (GLOVE) ×2
GLOVE SURG LX STRL 7.5 STRW (GLOVE) ×1 IMPLANT
GLOVE SURG TRIUMPH 8.0 PF LTX (GLOVE) ×3 IMPLANT
GOWN STRL REUS W/ TWL LRG LVL3 (GOWN DISPOSABLE) ×2 IMPLANT
GOWN STRL REUS W/TWL LRG LVL3 (GOWN DISPOSABLE) ×6
LENS IOL TECNIS ITEC 25.5 (Intraocular Lens) ×2 IMPLANT
MARKER SKIN DUAL TIP RULER LAB (MISCELLANEOUS) ×3 IMPLANT
NDL FILTER BLUNT 18X1 1/2 (NEEDLE) ×1 IMPLANT
NDL RETROBULBAR .5 NSTRL (NEEDLE) IMPLANT
NEEDLE FILTER BLUNT 18X 1/2SAF (NEEDLE) ×2
NEEDLE FILTER BLUNT 18X1 1/2 (NEEDLE) ×1 IMPLANT
PACK CATARACT BRASINGTON (MISCELLANEOUS) ×3 IMPLANT
PACK EYE AFTER SURG (MISCELLANEOUS) ×3 IMPLANT
PACK OPTHALMIC (MISCELLANEOUS) ×3 IMPLANT
RING MALYGIN 7.0 (MISCELLANEOUS) IMPLANT
SUT ETHILON 10-0 CS-B-6CS-B-6 (SUTURE)
SUT VICRYL  9 0 (SUTURE)
SUT VICRYL 9 0 (SUTURE) IMPLANT
SUTURE EHLN 10-0 CS-B-6CS-B-6 (SUTURE) IMPLANT
SYR 3ML LL SCALE MARK (SYRINGE) ×3 IMPLANT
SYR 5ML LL (SYRINGE) ×3 IMPLANT
SYR TB 1ML LUER SLIP (SYRINGE) ×3 IMPLANT
WATER STERILE IRR 250ML POUR (IV SOLUTION) ×3 IMPLANT
WIPE NON LINTING 3.25X3.25 (MISCELLANEOUS) ×3 IMPLANT

## 2016-11-29 NOTE — H&P (Signed)
The History and Physical notes are on paper, have been signed, and are to be scanned. The patient remains stable and unchanged from the H&P.   Previous H&P reviewed, patient examined, and there are no changes.  Ronny Korff 11/29/2016 11:05 AM

## 2016-11-29 NOTE — Anesthesia Postprocedure Evaluation (Signed)
Anesthesia Post Note  Patient: Amy Stone  Procedure(s) Performed: Procedure(s) (LRB): CATARACT EXTRACTION PHACO AND INTRAOCULAR LENS PLACEMENT (IOC)  Right (Right)  Patient location during evaluation: PACU Anesthesia Type: MAC Level of consciousness: awake and alert and oriented Pain management: satisfactory to patient Vital Signs Assessment: post-procedure vital signs reviewed and stable Respiratory status: spontaneous breathing, nonlabored ventilation and respiratory function stable Cardiovascular status: blood pressure returned to baseline and stable Postop Assessment: Adequate PO intake and No signs of nausea or vomiting Anesthetic complications: no    Raliegh Ip

## 2016-11-29 NOTE — Op Note (Signed)
LOCATION:  [place]   PREOPERATIVE DIAGNOSIS:    Nuclear sclerotic cataract right eye. H25.11   POSTOPERATIVE DIAGNOSIS:  Nuclear sclerotic cataract right eye.     PROCEDURE:  Phacoemusification with posterior chamber intraocular lens placement of the right eye   LENS:   Implant Name Type Inv. Item Serial No. Manufacturer Lot No. LRB No. Used  LENS IOL DIOP 25.5 - [id number] Intraocular Lens LENS IOL DIOP 25.5 [id number] AMO   Right 1        ULTRASOUND TIME: 23 % of 1 minutes, 7 seconds.  CDE 15.7   SURGEON:  Wyonia Hough, MD   ANESTHESIA:  Topical with tetracaine drops and 2% Xylocaine jelly, augmented with 1% preservative-free intracameral lidocaine.    COMPLICATIONS:  None.   DESCRIPTION OF PROCEDURE:  The patient was identified in the holding room and transported to the operating room and placed in the supine position under the operating microscope.  The right eye was identified as the operative eye and it was prepped and draped in the usual sterile ophthalmic fashion.   A 1 millimeter clear-corneal paracentesis was made at the 12:00 position.  0.5 ml of preservative-free 1% lidocaine was injected into the anterior chamber. The anterior chamber was filled with Viscoat viscoelastic.  A 2.4 millimeter keratome was used to make a near-clear corneal incision at the 9:00 position.  A curvilinear capsulorrhexis was made with a cystotome and capsulorrhexis forceps.  Balanced salt solution was used to hydrodissect and hydrodelineate the nucleus.   Phacoemulsification was then used in stop and chop fashion to remove the lens nucleus and epinucleus.  The remaining cortex was then removed using the irrigation and aspiration handpiece. Provisc was then placed into the capsular bag to distend it for lens placement.  A lens was then injected into the capsular bag.  The remaining viscoelastic was aspirated.   Wounds were hydrated with balanced salt solution.  The anterior  chamber was inflated to a physiologic pressure with balanced salt solution.  No wound leaks were noted. Cefuroxime 0.1 ml of a 10mg /ml solution was injected into the anterior chamber for a dose of 1 mg of intracameral antibiotic at the completion of the case.   Timolol and Brimonidine drops were applied to the eye.  The patient was taken to the recovery room in stable condition without complications of anesthesia or surgery.   [name] 11/29/2016, 12:20 PM

## 2016-11-29 NOTE — Anesthesia Procedure Notes (Signed)
Procedure Name: MAC Performed by: Kaeson Kleinert Pre-anesthesia Checklist: Patient identified, Emergency Drugs available, Suction available, Timeout performed and Patient being monitored Patient Re-evaluated:Patient Re-evaluated prior to inductionOxygen Delivery Method: Nasal cannula Placement Confirmation: positive ETCO2       

## 2016-11-29 NOTE — Anesthesia Preprocedure Evaluation (Signed)
Anesthesia Evaluation  Patient identified by MRN, date of birth, ID band Patient awake    Reviewed: Allergy & Precautions, H&P , NPO status , Patient's Chart, lab work & pertinent test results, reviewed documented beta blocker date and time   History of Anesthesia Complications (+) PONV and history of anesthetic complications  Airway Mallampati: I  TM Distance: >3 FB Neck ROM: full    Dental  (+) Upper Dentures, Lower Dentures   Pulmonary shortness of breath, COPD, former smoker,  Stable symptoms.  Used inhaler this morning.   Pulmonary exam normal breath sounds clear to auscultation       Cardiovascular Exercise Tolerance: Poor hypertension, + CAD, + Past MI and + Peripheral Vascular Disease  Normal cardiovascular exam Rhythm:regular Rate:Normal  Doing ok.  No recent changes in symptoms.   Neuro/Psych negative neurological ROS  negative psych ROS   GI/Hepatic negative GI ROS, Neg liver ROS,   Endo/Other  Hypothyroidism   Renal/GU negative Renal ROS  negative genitourinary   Musculoskeletal   Abdominal   Peds  Hematology negative hematology ROS (+)   Anesthesia Other Findings   Reproductive/Obstetrics negative OB ROS                             Anesthesia Physical  Anesthesia Plan  ASA: III  Anesthesia Plan: MAC   Post-op Pain Management:    Induction:   PONV Risk Score and Plan: 3 and Midazolam  Airway Management Planned:   Additional Equipment:   Intra-op Plan:   Post-operative Plan:   Informed Consent: I have reviewed the patients History and Physical, chart, labs and discussed the procedure including the risks, benefits and alternatives for the proposed anesthesia with the patient or authorized representative who has indicated his/her understanding and acceptance.     Plan Discussed with: CRNA and Anesthesiologist  Anesthesia Plan Comments:          Anesthesia Quick Evaluation

## 2016-11-29 NOTE — Transfer of Care (Signed)
Immediate Anesthesia Transfer of Care Note  Patient: Amy Stone  Procedure(s) Performed: Procedure(s): CATARACT EXTRACTION PHACO AND INTRAOCULAR LENS PLACEMENT (IOC)  Right (Right)  Patient Location: PACU  Anesthesia Type: MAC  Level of Consciousness: awake, alert  and patient cooperative  Airway and Oxygen Therapy: Patient Spontanous Breathing and Patient connected to supplemental oxygen  Post-op Assessment: Post-op Vital signs reviewed, Patient's Cardiovascular Status Stable, Respiratory Function Stable, Patent Airway and No signs of Nausea or vomiting  Post-op Vital Signs: Reviewed and stable  Complications: No apparent anesthesia complications

## 2016-12-01 ENCOUNTER — Encounter: Payer: Self-pay | Admitting: Ophthalmology

## 2017-01-05 DIAGNOSIS — N393 Stress incontinence (female) (male): Secondary | ICD-10-CM | POA: Diagnosis not present

## 2017-01-05 DIAGNOSIS — Z8551 Personal history of malignant neoplasm of bladder: Secondary | ICD-10-CM | POA: Diagnosis not present

## 2017-01-08 DIAGNOSIS — H40033 Anatomical narrow angle, bilateral: Secondary | ICD-10-CM | POA: Diagnosis not present

## 2017-01-08 DIAGNOSIS — Z961 Presence of intraocular lens: Secondary | ICD-10-CM | POA: Diagnosis not present

## 2017-02-06 ENCOUNTER — Other Ambulatory Visit: Payer: Self-pay | Admitting: *Deleted

## 2017-02-06 ENCOUNTER — Encounter: Payer: Self-pay | Admitting: Hematology and Oncology

## 2017-02-06 ENCOUNTER — Inpatient Hospital Stay: Payer: Medicare HMO | Attending: Hematology and Oncology

## 2017-02-06 ENCOUNTER — Inpatient Hospital Stay (HOSPITAL_BASED_OUTPATIENT_CLINIC_OR_DEPARTMENT_OTHER): Payer: Medicare HMO | Admitting: Hematology and Oncology

## 2017-02-06 VITALS — BP 142/75 | HR 55 | Temp 96.6°F | Resp 18 | Wt 123.6 lb

## 2017-02-06 DIAGNOSIS — I2510 Atherosclerotic heart disease of native coronary artery without angina pectoris: Secondary | ICD-10-CM | POA: Diagnosis not present

## 2017-02-06 DIAGNOSIS — I739 Peripheral vascular disease, unspecified: Secondary | ICD-10-CM | POA: Diagnosis not present

## 2017-02-06 DIAGNOSIS — Z170 Estrogen receptor positive status [ER+]: Secondary | ICD-10-CM | POA: Diagnosis not present

## 2017-02-06 DIAGNOSIS — M810 Age-related osteoporosis without current pathological fracture: Secondary | ICD-10-CM

## 2017-02-06 DIAGNOSIS — J449 Chronic obstructive pulmonary disease, unspecified: Secondary | ICD-10-CM | POA: Insufficient documentation

## 2017-02-06 DIAGNOSIS — C50111 Malignant neoplasm of central portion of right female breast: Secondary | ICD-10-CM | POA: Diagnosis not present

## 2017-02-06 DIAGNOSIS — Z79899 Other long term (current) drug therapy: Secondary | ICD-10-CM | POA: Diagnosis not present

## 2017-02-06 DIAGNOSIS — Z79811 Long term (current) use of aromatase inhibitors: Secondary | ICD-10-CM

## 2017-02-06 DIAGNOSIS — Z923 Personal history of irradiation: Secondary | ICD-10-CM | POA: Diagnosis not present

## 2017-02-06 DIAGNOSIS — Z853 Personal history of malignant neoplasm of breast: Secondary | ICD-10-CM

## 2017-02-06 DIAGNOSIS — Z951 Presence of aortocoronary bypass graft: Secondary | ICD-10-CM | POA: Diagnosis not present

## 2017-02-06 DIAGNOSIS — I714 Abdominal aortic aneurysm, without rupture: Secondary | ICD-10-CM | POA: Insufficient documentation

## 2017-02-06 DIAGNOSIS — I10 Essential (primary) hypertension: Secondary | ICD-10-CM | POA: Insufficient documentation

## 2017-02-06 DIAGNOSIS — Z9011 Acquired absence of right breast and nipple: Secondary | ICD-10-CM | POA: Insufficient documentation

## 2017-02-06 DIAGNOSIS — M1990 Unspecified osteoarthritis, unspecified site: Secondary | ICD-10-CM | POA: Diagnosis not present

## 2017-02-06 DIAGNOSIS — N644 Mastodynia: Secondary | ICD-10-CM | POA: Diagnosis not present

## 2017-02-06 DIAGNOSIS — E785 Hyperlipidemia, unspecified: Secondary | ICD-10-CM | POA: Diagnosis not present

## 2017-02-06 DIAGNOSIS — E039 Hypothyroidism, unspecified: Secondary | ICD-10-CM | POA: Insufficient documentation

## 2017-02-06 DIAGNOSIS — Z87891 Personal history of nicotine dependence: Secondary | ICD-10-CM | POA: Diagnosis not present

## 2017-02-06 DIAGNOSIS — Z7982 Long term (current) use of aspirin: Secondary | ICD-10-CM | POA: Diagnosis not present

## 2017-02-06 DIAGNOSIS — I252 Old myocardial infarction: Secondary | ICD-10-CM | POA: Diagnosis not present

## 2017-02-06 LAB — COMPREHENSIVE METABOLIC PANEL
ALT: 18 U/L (ref 14–54)
AST: 23 U/L (ref 15–41)
Albumin: 4.4 g/dL (ref 3.5–5.0)
Alkaline Phosphatase: 91 U/L (ref 38–126)
Anion gap: 11 (ref 5–15)
BUN: 11 mg/dL (ref 6–20)
CO2: 24 mmol/L (ref 22–32)
Calcium: 9.5 mg/dL (ref 8.9–10.3)
Chloride: 99 mmol/L — ABNORMAL LOW (ref 101–111)
Creatinine, Ser: 0.8 mg/dL (ref 0.44–1.00)
GFR calc Af Amer: >60 mL/min (ref >60)
GFR calc non Af Amer: >60 mL/min (ref >60)
Glucose, Bld: 111 mg/dL — ABNORMAL HIGH (ref 65–99)
Potassium: 4.1 mmol/L (ref 3.5–5.1)
Sodium: 134 mmol/L — ABNORMAL LOW (ref 135–145)
Total Bilirubin: 0.7 mg/dL (ref 0.3–1.2)
Total Protein: 7.8 g/dL (ref 6.5–8.1)

## 2017-02-06 LAB — CBC WITH DIFFERENTIAL/PLATELET
Basophils Absolute: 0.1 10*3/uL (ref 0–0.1)
Basophils Relative: 1 %
Eosinophils Absolute: 0.3 10*3/uL (ref 0–0.7)
Eosinophils Relative: 3 %
HCT: 38.8 % (ref 35.0–47.0)
Hemoglobin: 13 g/dL (ref 12.0–16.0)
Lymphocytes Relative: 19 %
Lymphs Abs: 1.8 10*3/uL (ref 1.0–3.6)
MCH: 29.8 pg (ref 26.0–34.0)
MCHC: 33.6 g/dL (ref 32.0–36.0)
MCV: 88.7 fL (ref 80.0–100.0)
Monocytes Absolute: 0.7 10*3/uL (ref 0.2–0.9)
Monocytes Relative: 8 %
Neutro Abs: 6.4 10*3/uL (ref 1.4–6.5)
Neutrophils Relative %: 69 %
Platelets: 230 10*3/uL (ref 150–440)
RBC: 4.37 MIL/uL (ref 3.80–5.20)
RDW: 14 % (ref 11.5–14.5)
WBC: 9.3 10*3/uL (ref 3.6–11.0)

## 2017-02-06 MED ORDER — LETROZOLE 2.5 MG PO TABS: 2.5000 mg | ORAL_TABLET | Freq: Every day | ORAL | 3 refills | Status: DC

## 2017-02-06 NOTE — Progress Notes (Signed)
Here for follow up. Stated intermittent pain in R breast ( none now) denies lumps  . Upper R breast area-shooting she stated . X 2 mo. Needs refill of Femara

## 2017-02-06 NOTE — Progress Notes (Signed)
[facility] day:  02/06/17  Chief Complaint: Amy Stone is a 76 y.o. female with stage IB right breast cancer who is seen for 6 month assessment.  HPI:  The patient was last seen in the medical oncology clinic on 08/07/2016.   At that time, she was noted intermittent right breast pain.  Exam was stable.  Labs were normal.  She continued Femara.  We discussed repeat bone density study to follow-up on her osteoporosis.  She declined.  She wished to continue only calcium and vitamin D.  She has bilateral cataract surgery (left: 09/18/2016; right: 11/29/2016).  During the interim, patient has been doing "ok". She continues to have intermittent RIGHT breast pain. She denies skin changes. Patient denies pain otherwise. She continues to perform self breast exams monthly. Patient denies B symptoms and interval infections. Patient eating well, with no significant weight loss demonstrated. Patient still taking her Femara with no perceived side effects.    Past Medical History:  Diagnosis Date  . AAA (abdominal aortic aneurysm) ([place])   . Blood transfusion   . Breast CA ([place]) 2016   right breast with lumpectomy and rad tx  . Breast cancer ([place]) 2016   right breast with lumpectomy and rad tx  . Breast screening, unspecified   . Cancer [place]) 2015   Bladder  . 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 Cervicalgia   . Chronic airway obstruction, not elsewhere classified   . Coronary atherosclerosis of unspecified type of vessel, native or graft 2007  . Epigastric pain   . Myocardial infarction ([place]) 2007  . Neoplasm of uncertain behavior of skin   . Osteoarthrosis, unspecified whether generalized or localized, unspecified site   . Osteoporosis, unspecified   . Other and unspecified hyperlipidemia   . Other chest pain   . Peripheral vascular disease ([place])   . PONV (postoperative nausea and vomiting)   . Shortness of breath   . Thoracic  aneurysm, ruptured ([place])   . Thyroid nodule    seen on CT Scan 07/19/2011  . Unspecified essential hypertension   . Unspecified hypothyroidism   . Unspecified tinnitus   . UTI (lower urinary tract infection)   . Wears dentures    full upper and lower    Past Surgical History:  Procedure Laterality Date  . ABDOMINAL AORTAGRAM N/A 05/15/2011   Procedure: ABDOMINAL Maxcine Ham;  Surgeon: Angelia Mould, MD;  Location[facility] C[facility] LAB;  Service: Cardiovascular;  Laterality: N/A;  . ABDOMINAL HYSTERECTOMY    . acute inferior mi  11/2005  . AORTA - BILATERAL FEMORAL ARTERY BYPASS GRAFT  07/25/2011   Procedure: AORTA BIFEMORAL BYPASS GRAFT;  Surgeon: Angelia Mould, MD;  Location: [facility];  Service: Vascular;  Laterality: Bilateral;  . BREAST BIOPSY Right 2016   positive  . BREAST LUMPECTOMY Right Feb. 2016  . CATARACT EXTRACTION W/PHACO Left 09/18/2016   Procedure: CATARACT EXTRACTION PHACO AND INTRAOCULAR LENS PLACEMENT ([place]) Left;  Surgeon: Leandrew Koyanagi, MD;  Location: [facility];  Service: Ophthalmology;  Laterality: Left;  . CATARACT EXTRACTION W/PHACO Right 11/29/2016   Procedure: CATARACT EXTRACTION PHACO AND INTRAOCULAR LENS PLACEMENT ([place])  Right;  Surgeon: Leandrew Koyanagi, MD;  Location: [facility];  Service: Ophthalmology;  Laterality: Right;  . CORONARY ARTERY BYPASS GRAFT  11/2005  . goiter removal  1979  . PORT WINE STAIN REMOVAL W/ LASER  2004   bladder cancer  . PR VEIN BYPASS  GRAFT,AORTO-FEM-POP  July 25, 2011    Family History  Problem Relation Age of Onset  . Alzheimer's disease Father   . Parkinsonism Father   . Heart defect Mother        enlarged heart  . Heart attack Mother   . Hypertension Sister   . Diabetes Sister   . Heart disease Sister   . Hyperlipidemia Sister   . Breast cancer Sister 17  . Hypothyroidism Sister   . Diabetes Sister   . Aneurysm Paternal Grandfather   . Breast cancer Daughter 66    Social History:   reports that she quit smoking about 11 years ago. Her smoking use included Cigarettes. She quit after 1.00 year of use. She has never used smokeless tobacco. She reports that she does not drink alcohol or use drugs.  She quit smoking on 11/24/2005.  The patient is alone today.  Allergies:  Allergies  Allergen Reactions  . Iohexol      Code: HIVES, Desc: CONTRAST REACTION OF HIVES (LARGE WHELTS DEVELOPED OVER ENTIRE BODY/MMS   . Prednisone Other (See Comments)    hallucinations    Current Medications: Current Outpatient Prescriptions  Medication Sig Dispense Refill  . aspirin 81 MG tablet Take 81 mg by mouth daily.    [name] atenolol (TENORMIN) 25 MG tablet Take 1 tablet (25 mg total) by mouth daily. 90 tablet 3  . atorvastatin (LIPITOR) 80 MG tablet Take 1 tablet (80 mg total) by mouth at bedtime. 90 tablet 3  . Fluticasone-Salmeterol (ADVAIR) 250-50 MCG/DOSE AEPB Inhale 1 puff into the lungs 2 (two) times daily. 60 each 11  . FLUZONE HIGH-DOSE 0.5 ML injection     . letrozole (FEMARA) 2.5 MG tablet Take 1 tablet (2.5 mg total) by mouth daily. 90 tablet 3  . levothyroxine (SYNTHROID, LEVOTHROID) 50 MCG tablet Take 0.5 tablets (25 mcg total) by mouth daily. 45 tablet 3  . losartan (COZAAR) 50 MG tablet Take 1 tablet (50 mg total) by mouth daily. 90 tablet 3  . tiotropium (SPIRIVA HANDIHALER) 18 MCG inhalation capsule Place into inhaler and inhale.    . VENTOLIN HFA 108 (90 Base) MCG/ACT inhaler INHALE TWO PUFFS BY MOUTH EVERY 6 HOURS AS NEEDED FOR WHEEZING OR  SHORTNESS  OF  BREATH 18 each 2   No current facility-administered medications for this visit.     Review of Systems:  GENERAL:  Feels "pretty good".  No fevers or sweats.  Weight down 2 pounds. PERFORMANCE STATUS (ECOG):  1 HEENT:  No visual changes, runny nose, sore throat, mouth sores or tenderness. Lungs: No shortness of breath or cough.  No hemoptysis. Cardiac:  No chest pain, palpitations, orthopnea, or PND. Breasts:   Intermittent right breast pain (see HPI). GI:  No nausea, vomiting, diarrhea, constipation, melena or hematochezia.  Cut down on salt. GU:  No urgency, frequency, dysuria, or hematuria. Musculoskeletal:  No back pain.  No joint pain.  No muscle tenderness. Extremities:  No pain or swelling. Skin:  No rashes or skin changes. Neuro:  No headache, numbness or weakness, balance or coordination issues. Endocrine:  No diabetes, thyroid issues, hot flashes or night sweats. Psych:  No mood changes, depression or anxiety. Pain:  No focal pain. Review of systems:  All other systems reviewed and found to be negative.  Physical Exam: Blood pressure (!) 142/75, pulse (!) 55, temperature (!) 96.6 F (35.9 C), temperature source Tympanic, resp. rate 18, weight 123 lb 9.6 oz (56.1 kg). GENERAL:  Well  developed, well nourished, woman sitting comfortably in the exam room in no acute distress. MENTAL STATUS:  Alert and oriented to person, place and time. HEAD:  Short gray hair.  Normocephalic, atraumatic, face symmetric, no Cushingoid features. EYES:  Glasses.  Brown/hazel eyes.  Pupils equal round and reactive to light and accomodation.  No conjunctivitis or scleral icterus. ENT:  Hearing aide.  Oropharynx clear without lesion.  Dentures. Tongue normal. Mucous membranes moist.  RESPIRATORY:  Clear to auscultation without rales, wheezes or rhonchi. CARDIOVASCULAR:  Regular rate and rhythm without murmur, rub or gallop. CHEST:  Median sternotomy. BREAST:  Right breast with 3 o'clock incision.  Tender fibrocystic changes medially and inferiorly (no change).  No masses, skin changes or nipple discharge.  Left breast with fibrocystic changes superiorly at the 12 o'clock position 4-5 cm above the nipple.  No masses, skin changes or nipple discharge.  ABDOMEN:  Soft, non-tender, with active bowel sounds, and no hepatosplenomegaly.  No masses. SKIN:  No rashes, ulcers or lesions. EXTREMITIES: No edema, no skin  discoloration or tenderness.  No palpable cords. LYMPH NODES: No palpable cervical, supraclavicular, axillary or inguinal adenopathy  NEUROLOGICAL: Unremarkable. PSYCH:  Appropriate.   Appointment on 02/06/2017  Component Date Value Ref Range Status  . WBC 02/06/2017 9.3  3.6 - 11.0 K/uL Final  . RBC 02/06/2017 4.37  3.80 - 5.20 MIL/uL Final  . Hemoglobin 02/06/2017 13.0  12.0 - 16.0 g/dL Final  . HCT 02/06/2017 38.8  35.0 - 47.0 % Final  . MCV 02/06/2017 88.7  80.0 - 100.0 fL Final  . MCH 02/06/2017 29.8  26.0 - 34.0 pg Final  . MCHC 02/06/2017 33.6  32.0 - 36.0 g/dL Final  . RDW 02/06/2017 14.0  11.5 - 14.5 % Final  . Platelets 02/06/2017 230  150 - 440 K/uL Final  . Neutrophils Relative % 02/06/2017 69  % Final  . Neutro Abs 02/06/2017 6.4  1.4 - 6.5 K/uL Final  . Lymphocytes Relative 02/06/2017 19  % Final  . Lymphs Abs 02/06/2017 1.8  1.0 - 3.6 K/uL Final  . Monocytes Relative 02/06/2017 8  % Final  . Monocytes Absolute 02/06/2017 0.7  0.2 - 0.9 K/uL Final  . Eosinophils Relative 02/06/2017 3  % Final  . Eosinophils Absolute 02/06/2017 0.3  0 - 0.7 K/uL Final  . Basophils Relative 02/06/2017 1  % Final  . Basophils Absolute 02/06/2017 0.1  0 - 0.1 K/uL Final  . Sodium 02/06/2017 134* 135 - 145 mmol/L Final  . Potassium 02/06/2017 4.1  3.5 - 5.1 mmol/L Final  . Chloride 02/06/2017 99* 101 - 111 mmol/L Final  . CO2 02/06/2017 24  22 - 32 mmol/L Final  . Glucose, Bld 02/06/2017 111* 65 - 99 mg/dL Final  . BUN 02/06/2017 11  6 - 20 mg/dL Final  . Creatinine, Ser 02/06/2017 0.80  0.44 - 1.00 mg/dL Final  . Calcium 02/06/2017 9.5  8.9 - 10.3 mg/dL Final  . Total Protein 02/06/2017 7.8  6.5 - 8.1 g/dL Final  . Albumin 02/06/2017 4.4  3.5 - 5.0 g/dL Final  . AST 02/06/2017 23  15 - 41 U/L Final  . ALT 02/06/2017 18  14 - 54 U/L Final  . Alkaline Phosphatase 02/06/2017 91  38 - 126 U/L Final  . Total Bilirubin 02/06/2017 0.7  0.3 - 1.2 mg/dL Final  . GFR calc non Af Amer  02/06/2017 >60  >60 mL/min Final  . GFR calc Af Amer 02/06/2017 >60  >60  mL/min Final   Comment: (NOTE) The eGFR has been calculated using the CKD EPI equation. This calculation has not been validated in all clinical situations. eGFR's persistently <60 mL/min signify possible Chronic Kidney Disease.   . Anion gap 02/06/2017 11  5 - 15 Final    Assessment:  Amy Stone is a 76 y.o. female with stage IB right breast cancer s/p partial mastectomy on 06/02/2014.  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  Tumor was ER positive (> 90%), PR positive (1-10%), and Her2/neu negative by FISH.  Two sentinel lymph nodes were negative.  Pathologic stage was T1bN0M0.  She received 4256 cGy to the right breast from 07/01/2014 - 07/22/2014 and 1440 cGy to the scar from 07/23/2014 - 08/05/2014.    She began Femara after completing radiation.   Mammogram on 05/25/2016 was negative.  CA27.29 has been followed: 8.5 on 03/24/2016, 6.9 on 08/07/2016, and 8.4 on 02/06/2017.  Bone density study on 10/08/2014 revealed osteoporosis with a T score of -3.2 in the right femur and -2.4 in L1-L4.  She was briefly on weekly Fosamax.  She is not interested in any further treatment.  She has a history of non-invasive bladder cancer.  She undergoes yearly cystoscopy (next appointment 11/2015).  Symptomatically, she notes intermittent right breast pain.  Exam reveals fibrocystic changes in the RIGHT breast at the 12 o'clock position. . Labs unremarkable.   Plan: 1.  Labs today:  CBC with diff, CMP, CA27.29. 2.  Continue Femara as previously prescribed. Refill Rx: Femara 2.35m daily (Disp #90 with 3 refills) 3.  Schedule bilateral mammogram on 05/25/2017. 4.  Discuss consideration of repeat bone density study.  Patient continues to decline.  Encouraged to continue calcium 12052mand vitamin D 800 IU daily.  5.  RTC in 6 months for MD assessment and labs (CBC with diff, CMP, CA27.29).   BrHonor LohNP  02/06/2017, 11:41 AM   I saw and evaluated the patient, participating in the key portions of the service and reviewing pertinent diagnostic studies and records.  I reviewed the nurse practitioner's note and agree with the findings and the plan.  The assessment and plan were discussed with the patient.  A few questions were asked by the patient and answered.   MeNolon StallsMD 02/06/2017,11:41 AM

## 2017-02-07 LAB — CANCER ANTIGEN 27.29: CA 27.29: 8.4 U/mL (ref 0.0–38.6)

## 2017-02-16 ENCOUNTER — Other Ambulatory Visit: Payer: Self-pay | Admitting: Family Medicine

## 2017-02-26 ENCOUNTER — Ambulatory Visit: Payer: Medicare HMO | Admitting: Family Medicine

## 2017-02-26 ENCOUNTER — Encounter: Payer: Self-pay | Admitting: Family Medicine

## 2017-02-26 ENCOUNTER — Ambulatory Visit (INDEPENDENT_AMBULATORY_CARE_PROVIDER_SITE_OTHER)
Admission: RE | Admit: 2017-02-26 | Discharge: 2017-02-26 | Disposition: A | Discharge status: Home / Self Care | Payer: Medicare HMO | Source: Ambulatory Visit | Attending: Family Medicine | Admitting: Family Medicine

## 2017-02-26 VITALS — BP 110/60 | HR 90 | Temp 100.1°F | Wt 118.5 lb

## 2017-02-26 DIAGNOSIS — R05 Cough: Secondary | ICD-10-CM | POA: Diagnosis not present

## 2017-02-26 DIAGNOSIS — R059 Cough, unspecified: Secondary | ICD-10-CM

## 2017-02-26 MED ORDER — TIOTROPIUM BROMIDE MONOHYDRATE 18 MCG IN CAPS: 18.0000 ug | ORAL_CAPSULE | Freq: Every day | RESPIRATORY_TRACT | 12 refills | Status: DC

## 2017-02-26 MED ORDER — DOXYCYCLINE HYCLATE 100 MG PO TABS: 100.0000 mg | ORAL_TABLET | Freq: Two times a day (BID) | ORAL | 0 refills | Status: DC

## 2017-02-26 NOTE — Patient Instructions (Addendum)
Use your regular inhaler.   Add on spiriva.  Use it daily.  If it is too expensive then let us know.   Start doxycycline- antibiotic.  Update [place] as needed.   Take care.  Glad to see you.

## 2017-02-26 NOTE — Progress Notes (Signed)
On inhalers at baseline. Sx started about 1 week ago.  Fevers and chills noted.  Cough, wheeze, didn't feel well in general.  Voice change.  More sputum, discolored.  Coughing to the point of vomiting.  Using SABA BID now, prev QD.  Has been off advair and spiriva.    Meds, vitals, and allergies reviewed.   ROS: Per HPI unless specifically indicated in ROS section   GEN: nad, alert and oriented HEENT: mucous membranes moist, tm w/o erythema, nasal exam w/o erythema, clear discharge noted,  OP with cobblestoning NECK: supple w/o LA CV: rrr.   PULM: no wheeze but cough noted.  No focal dec in BS.  EXT: no edema

## 2017-02-28 DIAGNOSIS — R059 Cough, unspecified: Secondary | ICD-10-CM | POA: Insufficient documentation

## 2017-02-28 DIAGNOSIS — R05 Cough: Secondary | ICD-10-CM | POA: Insufficient documentation

## 2017-02-28 NOTE — Assessment & Plan Note (Signed)
Presumed bronchitis.  Nontoxic.  Okay for outpatient follow-up.  Advised to use SABA prn.   Add on spiriva.  Use it daily.  If it is too expensive then let us know.   Start doxycycline.  Update [place] as needed.

## 2017-04-13 ENCOUNTER — Ambulatory Visit: Payer: Commercial Managed Care - HMO | Admitting: Family

## 2017-04-13 ENCOUNTER — Encounter (HOSPITAL_COMMUNITY): Payer: Commercial Managed Care - HMO

## 2017-05-08 ENCOUNTER — Ambulatory Visit (HOSPITAL_COMMUNITY)
Admission: RE | Admit: 2017-05-08 | Discharge: 2017-05-08 | Disposition: A | Discharge status: Home / Self Care | Payer: Medicare HMO | Source: Ambulatory Visit | Attending: Family | Admitting: Family

## 2017-05-08 ENCOUNTER — Encounter: Payer: Self-pay | Admitting: Family

## 2017-05-08 ENCOUNTER — Ambulatory Visit (INDEPENDENT_AMBULATORY_CARE_PROVIDER_SITE_OTHER): Payer: Medicare HMO | Admitting: Family

## 2017-05-08 VITALS — BP 128/72 | HR 54 | Temp 97.0°F | Resp 16 | Ht 66.0 in | Wt 118.5 lb

## 2017-05-08 DIAGNOSIS — I70219 Atherosclerosis of native arteries of extremities with intermittent claudication, unspecified extremity: Secondary | ICD-10-CM

## 2017-05-08 DIAGNOSIS — Z9889 Other specified postprocedural states: Secondary | ICD-10-CM | POA: Diagnosis not present

## 2017-05-08 DIAGNOSIS — I7409 Other arterial embolism and thrombosis of abdominal aorta: Secondary | ICD-10-CM | POA: Diagnosis not present

## 2017-05-08 DIAGNOSIS — Z87891 Personal history of nicotine dependence: Secondary | ICD-10-CM

## 2017-05-08 NOTE — Patient Instructions (Signed)

## 2017-05-08 NOTE — Progress Notes (Signed)
[facility] [place]   CC: Follow up peripheral artery occlusive disease  History of Present Illness FRANSHESKA WILLINGHAM is a 77 y.o. female who is status post AAA and aortic bifemoral bypass graft April 2013 by Dr. Scot Dock for significant aortoiliac occlusive disease which was addressed at the time of her aneurysm repair with aortobifemoral bypass grafting.  She returns today for PAD follow up, she denies any claudication symptoms with walking any distance. Her legs do hurt with walking on concrete floor only. She denies non-healing wounds. She has night leg cramps, alleviated by po mustard.   She denies any stroke or TIA history. She does flexibility and strengthening exercises indoors in the cold months and is active outdoors in the warm months.   She did have a 4 vessel CABG in 2007 and an MI, she stopped smoking then.  She had right breast cancer, had partial mastectomy and radiation tx. She had stage IB right breast cancers/p partial mastectomyon 06/02/2014. 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 Tumor was ER positive (>90%), PR positive (1-10%), and Her2/neu negative by FISH. Two sentinel lymph nodes were negative. Pathologic stage was T1bN0M0.  She had the flu recently and was treated by her PCP, her cough remains.   Pt Diabetic: No Pt smoker: former smoker, quit in 2007  Pt meds include: Statin :Yes Betablocker: Yes ASA: Yes Other anticoagulants/antiplatelets: no    Past Medical History:  Diagnosis Date  . AAA (abdominal aortic aneurysm) ([place])   . Blood transfusion   . Breast CA ([place]) 2016   right breast with lumpectomy and rad tx  . Breast cancer ([place]) 2016   right breast with lumpectomy and rad tx  . Breast screening, unspecified   . Cancer [facility]) 2015   Bladder  . 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 Cervicalgia   . Chronic airway obstruction, not elsewhere classified   . Coronary  atherosclerosis of unspecified type of vessel, native or graft 2007  . Epigastric pain   . Myocardial infarction ([place]) 2007  . Neoplasm of uncertain behavior of skin   . Osteoarthrosis, unspecified whether generalized or localized, unspecified site   . Osteoporosis, unspecified   . Other and unspecified hyperlipidemia   . Other chest pain   . Peripheral vascular disease ([place])   . PONV (postoperative nausea and vomiting)   . Shortness of breath   . Thoracic aneurysm, ruptured ([place])   . Thyroid nodule    seen on CT Scan 07/19/2011  . Unspecified essential hypertension   . Unspecified hypothyroidism   . Unspecified tinnitus   . UTI (lower urinary tract infection)   . Wears dentures    full upper and lower    Social History Social History   Tobacco Use  . Smoking status: Former Smoker    Years: 1.00    Types: Cigarettes    Last attempt to quit: 11/24/2005    Years since quitting: 11.4  . Smokeless tobacco: Never Used  . Tobacco comment: 40 pack years   Substance Use Topics  . Alcohol use: No    Alcohol/week: 0.0 oz  . Drug use: No    Family History Family History  Problem Relation Age of Onset  . Alzheimer's disease Father   . Parkinsonism Father   . Heart defect Mother        enlarged heart  . Heart attack Mother   . Hypertension Sister   . Diabetes Sister   .  Heart disease Sister   . Hyperlipidemia Sister   . Breast cancer Sister 16  . Hypothyroidism Sister   . Diabetes Sister   . Aneurysm Paternal Grandfather   . Breast cancer Daughter 25    Past Surgical History:  Procedure Laterality Date  . ABDOMINAL AORTAGRAM N/A 05/15/2011   Procedure: ABDOMINAL Maxcine Ham;  Surgeon: Angelia Mould, MD;  Location: [facility] CATH LAB;  Service: Cardiovascular;  Laterality: N/A;  . ABDOMINAL HYSTERECTOMY    . acute inferior mi  11/2005  . AORTA - BILATERAL FEMORAL ARTERY BYPASS GRAFT  07/25/2011   Procedure: AORTA BIFEMORAL BYPASS GRAFT;  Surgeon: Angelia Mould, MD;   Location: [place];  Service: Vascular;  Laterality: Bilateral;  . BREAST BIOPSY Right 2016   positive  . BREAST LUMPECTOMY Right Feb. 2016  . CATARACT EXTRACTION W/PHACO Left 09/18/2016   Procedure: CATARACT EXTRACTION PHACO AND INTRAOCULAR LENS PLACEMENT ([place]) Left;  Surgeon: Leandrew Koyanagi, MD;  Location: [place];  Service: Ophthalmology;  Laterality: Left;  . CATARACT EXTRACTION W/PHACO Right 11/29/2016   Procedure: CATARACT EXTRACTION PHACO AND INTRAOCULAR LENS PLACEMENT ([place])  Right;  Surgeon: Leandrew Koyanagi, MD;  Location: [place];  Service: Ophthalmology;  Laterality: Right;  . CORONARY ARTERY BYPASS GRAFT  11/2005  . goiter removal  1979  . PORT WINE STAIN REMOVAL W/ LASER  2004   bladder cancer  . PR VEIN BYPASS GRAFT,AORTO-FEM-POP  July 25, 2011    Allergies  Allergen Reactions  . Iohexol      Code: HIVES, Desc: CONTRAST REACTION OF HIVES (LARGE WHELTS DEVELOPED OVER ENTIRE BODY/MMS   . Prednisone Other (See Comments)    hallucinations    Current Outpatient Medications  Medication Sig Dispense Refill  . aspirin 81 MG tablet Take 81 mg by mouth daily.    [name] atenolol (TENORMIN) 25 MG tablet Take 1 tablet (25 mg total) by mouth daily. 90 tablet 3  . atorvastatin (LIPITOR) 80 MG tablet Take 1 tablet (80 mg total) by mouth at bedtime. 90 tablet 3  . doxycycline (VIBRA-TABS) 100 MG tablet Take 1 tablet (100 mg total) 2 (two) times daily by mouth. 20 tablet 0  . letrozole (FEMARA) 2.5 MG tablet Take 1 tablet (2.5 mg total) by mouth daily. 90 tablet 3  . levothyroxine (SYNTHROID, LEVOTHROID) 50 MCG tablet Take 0.5 tablets (25 mcg total) by mouth daily. 45 tablet 3  . losartan (COZAAR) 50 MG tablet Take 1 tablet (50 mg total) by mouth daily. 90 tablet 3  . tiotropium (SPIRIVA HANDIHALER) 18 MCG inhalation capsule Place 1 capsule (18 mcg total) daily into inhaler and inhale. 30 capsule 12  . VENTOLIN HFA 108 (90 Base) MCG/ACT inhaler INHALE 2 PUFFS BY  MOUTH EVERY 6 HOURS AS NEEDED FOR WHEEZING OR SHORTNESS OF BREATH 18 each 2   No current facility-administered medications for this visit.     ROS: See HPI for pertinent positives and negatives.   Physical Examination  Vitals:   05/08/17 1545  BP: 128/72  Pulse: (!) 54  Resp: 16  Temp: (!) 97 F (36.1 C)  TempSrc: Oral  SpO2: 93%  Weight: 118 lb 8 oz (53.8 kg)  Height: 5' 6"  (1.676 m)   Body mass index is 19.13 kg/m.  General: A&O x 3, WDWN, thin but fit appearing female Gait: normal HENT: No gross abnormalities  Eyes: PERRLA Pulmonary: Respirations are non labored, limited air movement in all posterior fields, right posterior fields with rales to apices, left posterior  fields clear.  Cardiac: regular rhythm, bradycardic (on a beta blocker), no detected murmur     Carotid Bruits Left Right   Negative Negative   Abdominal aortic pulse is faintly palpable Radial pulses: are 2+ and =   VASCULAR EXAM: Extremitieswithout ischemic changes  without Gangrene; without open wounds.     LE Pulses LEFT RIGHT   FEMORAL 2+ palpable 2+ palpable    POPLITEAL not palpable  2+ palpable   POSTERIOR TIBIAL not palpable  not palpable    DORSALIS PEDIS  ANTERIOR TIBIAL 2+ palpable  2+ palpable    Abdomen: soft, NT, no palpable masses. Skin: no rashes, no ulcers. Musculoskeletal: no muscle wasting or atrophy. Neurologic: A&O X 3; appropriate affect, sensation is normal; MOTOR FUNCTION: moving all extremities equally, motor strength 5/5 throughout. Speech is fluent/loud. CN 2-12 intact except is slightly hard of hearing.     Psychiatric: Thought content is normal, mood appropriate for clinical situation.      ASSESSMENT: ARIZBETH CAWTHORN is a 77 y.o. female who is status post aortic bifemoral bypass graft in April 2013 for AAA and significant aortoiliac occlusive disease. She has no claudication symptoms with walking, no signs of ischemia in her feet/legs, no back or abdominal pain.  She exercises daily.     DATA  ABI (Date: 05/08/2017):  R:   ABI: 1.15 (was 1.24 on 04-07-16),   PT: bi  DP: bi  TBI:  0.66 (was 0.74)  L:   ABI: 1.13 (was 1.11),   PT: mono  DP: bi  TBI: 0.73 (was 0.66)  Bilateral ABI remain normal with bi and monophasic waveforms.    04/08/14 lower arterial Duplex demonstrated patent distal limbs of aorto-bi-femoral bypass graft, no hemodynamically significant plaque or thrombus visualized at limited imaged segments.    PLAN:  Continue active lifestyle and exercises.  Based on the patient's vascular studies and examination, pt will return to clinic in 1 year with ABI's. I advised her to notify us should she develop slow healing wounds or pain/weakness in her lower extremities with walking.  I discussed in depth with the patient the nature of atherosclerosis, and emphasized the importance of maximal medical management including strict control of blood pressure, blood glucose, and lipid levels, obtaining regular exercise, and continued cessation of smoking.  The patient is aware that without maximal medical management the underlying atherosclerotic disease process will progress, limiting the benefit of any interventions.  The patient was given information about PAD including signs, symptoms, treatment, what symptoms should prompt the patient to seek immediate medical care, and risk reduction measures to take.  Clemon Chambers, RN, MSN, FNP-C [facility] Phone: [phone]  Clinic MD: Scot Dock  05/08/17 4:00 PM

## 2017-05-14 ENCOUNTER — Other Ambulatory Visit: Payer: Self-pay | Admitting: Family Medicine

## 2017-05-19 DIAGNOSIS — J209 Acute bronchitis, unspecified: Secondary | ICD-10-CM | POA: Diagnosis not present

## 2017-05-19 DIAGNOSIS — J019 Acute sinusitis, unspecified: Secondary | ICD-10-CM | POA: Diagnosis not present

## 2017-05-19 DIAGNOSIS — B9689 Other specified bacterial agents as the cause of diseases classified elsewhere: Secondary | ICD-10-CM | POA: Diagnosis not present

## 2017-05-29 ENCOUNTER — Ambulatory Visit
Admission: RE | Admit: 2017-05-29 | Discharge: 2017-05-29 | Disposition: A | Discharge status: Home / Self Care | Payer: Medicare HMO | Source: Ambulatory Visit | Attending: Urgent Care | Admitting: Urgent Care

## 2017-05-29 DIAGNOSIS — R922 Inconclusive mammogram: Secondary | ICD-10-CM | POA: Diagnosis not present

## 2017-05-29 DIAGNOSIS — C50111 Malignant neoplasm of central portion of right female breast: Secondary | ICD-10-CM | POA: Diagnosis not present

## 2017-05-29 DIAGNOSIS — Z170 Estrogen receptor positive status [ER+]: Secondary | ICD-10-CM | POA: Insufficient documentation

## 2017-06-27 ENCOUNTER — Encounter: Payer: Self-pay | Admitting: Family Medicine

## 2017-07-01 ENCOUNTER — Other Ambulatory Visit: Payer: Self-pay | Admitting: Family Medicine

## 2017-07-14 ENCOUNTER — Other Ambulatory Visit: Payer: Self-pay | Admitting: Family Medicine

## 2017-07-16 DIAGNOSIS — Z961 Presence of intraocular lens: Secondary | ICD-10-CM | POA: Diagnosis not present

## 2017-08-07 ENCOUNTER — Inpatient Hospital Stay: Payer: Medicare HMO | Admitting: Hematology and Oncology

## 2017-08-07 ENCOUNTER — Other Ambulatory Visit: Payer: Self-pay

## 2017-08-07 ENCOUNTER — Encounter: Payer: Self-pay | Admitting: Hematology and Oncology

## 2017-08-07 ENCOUNTER — Inpatient Hospital Stay: Payer: Medicare HMO | Attending: Hematology and Oncology

## 2017-08-07 VITALS — BP 113/68 | HR 57 | Temp 97.6°F | Resp 18 | Wt 115.3 lb

## 2017-08-07 DIAGNOSIS — C50111 Malignant neoplasm of central portion of right female breast: Secondary | ICD-10-CM

## 2017-08-07 DIAGNOSIS — Z9011 Acquired absence of right breast and nipple: Secondary | ICD-10-CM

## 2017-08-07 DIAGNOSIS — Z923 Personal history of irradiation: Secondary | ICD-10-CM | POA: Insufficient documentation

## 2017-08-07 DIAGNOSIS — Z7189 Other specified counseling: Secondary | ICD-10-CM

## 2017-08-07 DIAGNOSIS — Z79811 Long term (current) use of aromatase inhibitors: Secondary | ICD-10-CM

## 2017-08-07 DIAGNOSIS — Z170 Estrogen receptor positive status [ER+]: Secondary | ICD-10-CM

## 2017-08-07 DIAGNOSIS — Z853 Personal history of malignant neoplasm of breast: Secondary | ICD-10-CM

## 2017-08-07 DIAGNOSIS — M810 Age-related osteoporosis without current pathological fracture: Secondary | ICD-10-CM

## 2017-08-07 DIAGNOSIS — Z8551 Personal history of malignant neoplasm of bladder: Secondary | ICD-10-CM | POA: Insufficient documentation

## 2017-08-07 LAB — COMPREHENSIVE METABOLIC PANEL
ALT: 14 U/L (ref 14–54)
AST: 20 U/L (ref 15–41)
Albumin: 4.1 g/dL (ref 3.5–5.0)
Alkaline Phosphatase: 78 U/L (ref 38–126)
Anion gap: 8 (ref 5–15)
BUN: 11 mg/dL (ref 6–20)
CO2: 26 mmol/L (ref 22–32)
Calcium: 9.4 mg/dL (ref 8.9–10.3)
Chloride: 102 mmol/L (ref 101–111)
Creatinine, Ser: 0.83 mg/dL (ref 0.44–1.00)
GFR calc Af Amer: >60 mL/min (ref >60)
GFR calc non Af Amer: >60 mL/min (ref >60)
Glucose, Bld: 97 mg/dL (ref 65–99)
Potassium: 3.9 mmol/L (ref 3.5–5.1)
Sodium: 136 mmol/L (ref 135–145)
Total Bilirubin: 0.5 mg/dL (ref 0.3–1.2)
Total Protein: 7.2 g/dL (ref 6.5–8.1)

## 2017-08-07 LAB — CBC WITH DIFFERENTIAL/PLATELET
Basophils Absolute: 0 10*3/uL (ref 0–0.1)
Basophils Relative: 1 %
Eosinophils Absolute: 0.2 10*3/uL (ref 0–0.7)
Eosinophils Relative: 2 %
HCT: 37.2 % (ref 35.0–47.0)
Hemoglobin: 12.7 g/dL (ref 12.0–16.0)
Lymphocytes Relative: 19 %
Lymphs Abs: 1.6 10*3/uL (ref 1.0–3.6)
MCH: 30.1 pg (ref 26.0–34.0)
MCHC: 34.1 g/dL (ref 32.0–36.0)
MCV: 88.2 fL (ref 80.0–100.0)
Monocytes Absolute: 0.6 10*3/uL (ref 0.2–0.9)
Monocytes Relative: 7 %
Neutro Abs: 5.8 10*3/uL (ref 1.4–6.5)
Neutrophils Relative %: 71 %
Platelets: 221 10*3/uL (ref 150–440)
RBC: 4.21 MIL/uL (ref 3.80–5.20)
RDW: 14.9 % — ABNORMAL HIGH (ref 11.5–14.5)
WBC: 8.2 10*3/uL (ref 3.6–11.0)

## 2017-08-07 NOTE — Progress Notes (Signed)
Patient offers no complaints.

## 2017-08-07 NOTE — Progress Notes (Signed)
[facility] day:  08/07/17  Chief Complaint: Amy Stone is a 77 y.o. female with stage IB right breast cancer who is seen for 6 month assessment.  HPI:  The patient was last seen in the medical oncology clinic on 02/06/2017.   At that time, she noted intermittent right breast pain.  Exam revealed fibrocystic changes in the RIGHT breast at the 12 o'clock position.  Labs were unremarkable.  She continued Femara.  She declined repeat bone density study.  Bilateral diagnostic mammogram on 05/29/2017 revealed no evidence of malignancy within either breast. There were stable postsurgical changes within the right breast.  During the interim, patient is doing well.  She has has right breast pain "now and then".  She denies any acute concerns. Patient is eating less; her weight has decreased 3 pounds. She is eating 2 meals/day (sometimes 3/day).  She denies any breast concerns. Patient has experienced no B symptoms. She denies recurrent infections.   Patient denies pain in the clinic today.    Past Medical History:  Diagnosis Date  . AAA (abdominal aortic aneurysm) ([place])   . Blood transfusion   . Breast CA ([place]) 2016   right breast with lumpectomy and rad tx  . Breast screening, unspecified   . Cancer [facility]) 2015   Bladder  . 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 Cervicalgia   . Chronic airway obstruction, not elsewhere classified   . Coronary atherosclerosis of unspecified type of vessel, native or graft 2007  . Epigastric pain   . Myocardial infarction ([place]) 2007  . Neoplasm of uncertain behavior of skin   . Osteoarthrosis, unspecified whether generalized or localized, unspecified site   . Osteoporosis, unspecified   . Other and unspecified hyperlipidemia   . Other chest pain   . Peripheral vascular disease ([place])   . PONV (postoperative nausea and vomiting)   . Shortness of breath   . Thoracic aneurysm, ruptured ([place])   . Thyroid  nodule    seen on CT Scan 07/19/2011  . Unspecified essential hypertension   . Unspecified hypothyroidism   . Unspecified tinnitus   . UTI (lower urinary tract infection)   . Wears dentures    full upper and lower    Past Surgical History:  Procedure Laterality Date  . ABDOMINAL AORTAGRAM N/A 05/15/2011   Procedure: ABDOMINAL Maxcine Ham;  Surgeon: Angelia Mould, MD;  Location: [facility] CATH LAB;  Service: Cardiovascular;  Laterality: N/A;  . ABDOMINAL HYSTERECTOMY    . acute inferior mi  11/2005  . AORTA - BILATERAL FEMORAL ARTERY BYPASS GRAFT  07/25/2011   Procedure: AORTA BIFEMORAL BYPASS GRAFT;  Surgeon: Angelia Mould, MD;  Location: [place];  Service: Vascular;  Laterality: Bilateral;  . BREAST BIOPSY Right 2016   positive  . BREAST LUMPECTOMY Right Feb. 2016   F/U with radiation  . CATARACT EXTRACTION W/PHACO Left 09/18/2016   Procedure: CATARACT EXTRACTION PHACO AND INTRAOCULAR LENS PLACEMENT ([place]) Left;  Surgeon: Leandrew Koyanagi, MD;  Location: [place];  Service: Ophthalmology;  Laterality: Left;  . CATARACT EXTRACTION W/PHACO Right 11/29/2016   Procedure: CATARACT EXTRACTION PHACO AND INTRAOCULAR LENS PLACEMENT ([place])  Right;  Surgeon: Leandrew Koyanagi, MD;  Location: [place];  Service: Ophthalmology;  Laterality: Right;  . CORONARY ARTERY BYPASS GRAFT  11/2005  . goiter removal  1979  . PORT WINE STAIN REMOVAL W/ LASER  2004   bladder cancer  . PR VEIN  BYPASS GRAFT,AORTO-FEM-POP  July 25, 2011    Family History  Problem Relation Age of Onset  . Alzheimer's disease Father   . Parkinsonism Father   . Heart defect Mother        enlarged heart  . Heart attack Mother   . Hypertension Sister   . Diabetes Sister   . Heart disease Sister   . Hyperlipidemia Sister   . Breast cancer Sister 19  . Hypothyroidism Sister   . Diabetes Sister   . Aneurysm Paternal Grandfather   . Breast cancer Daughter 36    Social History:  reports that she  quit smoking about 11 years ago. Her smoking use included cigarettes. She quit after 1.00 year of use. She has never used smokeless tobacco. She reports that she does not drink alcohol or use drugs.  She quit smoking on 11/24/2005.  The patient is alone today.  Allergies:  Allergies  Allergen Reactions  . Iohexol      Code: HIVES, Desc: CONTRAST REACTION OF HIVES (LARGE WHELTS DEVELOPED OVER ENTIRE BODY/MMS   . Prednisone Other (See Comments)    hallucinations    Current Medications: Current Outpatient Medications  Medication Sig Dispense Refill  . aspirin 81 MG tablet Take 81 mg by mouth daily.    [name] atenolol (TENORMIN) 25 MG tablet TAKE ONE TABLET BY MOUTH ONCE DAILY 90 tablet 0  . atorvastatin (LIPITOR) 80 MG tablet TAKE ONE TABLET BY MOUTH AT BEDTIME 90 tablet 0  . letrozole (FEMARA) 2.5 MG tablet Take 1 tablet (2.5 mg total) by mouth daily. 90 tablet 3  . levothyroxine (SYNTHROID, LEVOTHROID) 50 MCG tablet TAKE ONE-HALF TABLET BY MOUTH ONCE DAILY 45 tablet 0  . losartan (COZAAR) 50 MG tablet TAKE ONE TABLET BY MOUTH ONCE DAILY 90 tablet 0  . VENTOLIN HFA 108 (90 Base) MCG/ACT inhaler INHALE 2 PUFFS BY MOUTH EVERY 6 HOURS AS NEEDED FOR WHEEZING OR SHORTNESS OF BREATH 18 each 2  . doxycycline (VIBRA-TABS) 100 MG tablet Take 1 tablet (100 mg total) 2 (two) times daily by mouth. (Patient not taking: Reported on 08/07/2017) 20 tablet 0  . tiotropium (SPIRIVA HANDIHALER) 18 MCG inhalation capsule Place 1 capsule (18 mcg total) daily into inhaler and inhale. (Patient not taking: Reported on 08/07/2017) 30 capsule 12   No current facility-administered medications for this visit.     Review of Systems:  GENERAL:  Feels "pretty good".  No fevers, sweats.  Weight loss of 3 pounds. PERFORMANCE STATUS (ECOG): 1 HEENT:  No visual changes, runny nose, sore throat, mouth sores or tenderness. Lungs: No shortness of breath or cough.  No hemoptysis. Cardiac:  No chest pain, palpitations, orthopnea,  or PND. Breasts:  Intermittent right breast pain. GI:  No nausea, vomiting, diarrhea, constipation, melena or hematochezia. GU:  No urgency, frequency, dysuria, or hematuria. Musculoskeletal:  No back pain.  No joint pain.  No muscle tenderness. Extremities:  No pain or swelling. Skin:  No rashes or skin changes. Neuro:  No headache, numbness or weakness, balance or coordination issues. Endocrine:  No diabetes, thyroid issues, hot flashes or night sweats. Psych:  No mood changes, depression or anxiety. Pain:  No focal pain. Review of systems:  All other systems reviewed and found to be negative.   Physical Exam: Blood pressure 113/68, pulse (!) 57, temperature 97.6 F (36.4 C), temperature source Oral, resp. rate 18, weight 115 lb 4.8 oz (52.3 kg). GENERAL:  Thin woman sitting comfortably in the exam room in no  acute distress. MENTAL STATUS:  Alert and oriented to person, place and time. HEAD:  Short gray hair.  Normocephalic, atraumatic, face symmetric, no Cushingoid features. EYES:  Glasses.  Brown/hazel eyes.  Pupils equal round and reactive to light and accomodation.  No conjunctivitis or scleral icterus. ENT:  Oropharynx clear without lesion.  Tongue normal.  Dentures. Mucous membranes moist.  RESPIRATORY:  Clear to auscultation without rales, wheezes or rhonchi. CARDIOVASCULAR:  Regular rate and rhythm without murmur, rub or gallop. BREAST:  Right breast with well healed incision.  Scattered mildy tender fibrocystic changes.  No masses, skin changes or nipple discharge.  Left breast without masses, skin changes or nipple discharge. Fibrocystic changes superiorly. ABDOMEN:  Soft, non-tender, with active bowel sounds, and no hepatosplenomegaly.  No masses. SKIN:  No rashes, ulcers or lesions. EXTREMITIES: No edema, no skin discoloration or tenderness.  No palpable cords. LYMPH NODES: No palpable cervical, supraclavicular, axillary or inguinal adenopathy  NEUROLOGICAL:  Unremarkable. PSYCH:  Appropriate.   Orders Only on 08/07/2017  Component Date Value Ref Range Status  . Sodium 08/07/2017 136  135 - 145 mmol/L Final  . Potassium 08/07/2017 3.9  3.5 - 5.1 mmol/L Final  . Chloride 08/07/2017 102  101 - 111 mmol/L Final  . CO2 08/07/2017 26  22 - 32 mmol/L Final  . Glucose, Bld 08/07/2017 97  65 - 99 mg/dL Final  . BUN 08/07/2017 11  6 - 20 mg/dL Final  . Creatinine, Ser 08/07/2017 0.83  0.44 - 1.00 mg/dL Final  . Calcium 08/07/2017 9.4  8.9 - 10.3 mg/dL Final  . Total Protein 08/07/2017 7.2  6.5 - 8.1 g/dL Final  . Albumin 08/07/2017 4.1  3.5 - 5.0 g/dL Final  . AST 08/07/2017 20  15 - 41 U/L Final  . ALT 08/07/2017 14  14 - 54 U/L Final  . Alkaline Phosphatase 08/07/2017 78  38 - 126 U/L Final  . Total Bilirubin 08/07/2017 0.5  0.3 - 1.2 mg/dL Final  . GFR calc non Af Amer 08/07/2017 >60  >60 mL/min Final  . GFR calc Af Amer 08/07/2017 >60  >60 mL/min Final   Comment: (NOTE) The eGFR has been calculated using the CKD EPI equation. This calculation has not been validated in all clinical situations. eGFR's persistently <60 mL/min signify possible Chronic Kidney Disease.   Georgiann Hahn gap 08/07/2017 8  5 - 15 Final   Performed at [facility], [address] [place], [place] [address]  . WBC 08/07/2017 8.2  3.6 - 11.0 K/uL Final  . RBC 08/07/2017 4.21  3.80 - 5.20 MIL/uL Final  . Hemoglobin 08/07/2017 12.7  12.0 - 16.0 g/dL Final  . HCT 08/07/2017 37.2  35.0 - 47.0 % Final  . MCV 08/07/2017 88.2  80.0 - 100.0 fL Final  . MCH 08/07/2017 30.1  26.0 - 34.0 pg Final  . MCHC 08/07/2017 34.1  32.0 - 36.0 g/dL Final  . RDW 08/07/2017 14.9* 11.5 - 14.5 % Final  . Platelets 08/07/2017 221  150 - 440 K/uL Final  . Neutrophils Relative % 08/07/2017 71  % Final  . Neutro Abs 08/07/2017 5.8  1.4 - 6.5 K/uL Final  . Lymphocytes Relative 08/07/2017 19  % Final  . Lymphs Abs 08/07/2017 1.6  1.0 - 3.6 K/uL Final  . Monocytes Relative 08/07/2017 7  %  Final  . Monocytes Absolute 08/07/2017 0.6  0.2 - 0.9 K/uL Final  . Eosinophils Relative 08/07/2017 2  % Final  . Eosinophils  Absolute 08/07/2017 0.2  0 - 0.7 K/uL Final  . Basophils Relative 08/07/2017 1  % Final  . Basophils Absolute 08/07/2017 0.0  0 - 0.1 K/uL Final   Performed at [facility], [address] [place], [place] [address]    Assessment:  Amy Stone is a 77 y.o. female with stage IB right breast cancer s/p partial mastectomy on 06/02/2014.  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  Tumor was ER positive (> 90%), PR positive (1-10%), and Her2/neu negative by FISH.  Two sentinel lymph nodes were negative.  Pathologic stage was T1bN0M0.  She received 4256 cGy to the right breast from 07/01/2014 - 07/22/2014 and 1440 cGy to the scar from 07/23/2014 - 08/05/2014.    She began Femara after completing radiation.   Bilateral mammogram on 05/29/2017 revealed no evidence of malignancy within either breast. There were stable postsurgical changes within the right breast.  CA27.29 has been followed: 8.5 on 03/24/2016, 6.9 on 08/07/2016, 8.4 on 02/06/2017, and 11.9 on 08/07/2017.  Bone density study on 10/08/2014 revealed osteoporosis with a T score of -3.2 in the right femur and -2.4 in L1-L4.  She was briefly on weekly Fosamax.  She is not interested in any further treatment.  She has a history of non-invasive bladder cancer.  She undergoes yearly cystoscopy (next appointment 11/2015).  Symptomatically, she is doing well. Patient has no breast concerns.  Exam reveals stable fibrocystic changes.  Labs are unremarkable.   Plan: 1.  Labs today:  CBC with diff, CMP, CA27.29. 2.  Review interval mammogram- no evidence of malignancy. 3.  Discuss past history of bladder cancer. Patient sees Dr. Yves Dill (urology).  4.  Continue Femara as previously prescribed.  5.  Discuss consideration of repeat bone density study.  Patient continues to decline.  Discussed Prolia or consideration of another oral bisphosphonate. Patient adamantly refuses citing that she "doesn't see the need" because she "doesn't have many more years left on this Earth".   Encouraged to continue calcium 1250m and vitamin D 800 IU daily.  6.  RTC in 6 months for MD assessment and labs (CBC with diff, CMP, CA27.29).   BHonor Loh NP  08/07/2017, 12:33 PM   I saw and evaluated the patient, participating in the key portions of the service and reviewing pertinent diagnostic studies and records.  I reviewed the nurse practitioner's note and agree with the findings and the plan.  The assessment and plan were discussed with the patient.  Several questions were asked by the patient and answered.   MNolon Stalls MD 08/07/2017,12:33 PM

## 2017-08-08 LAB — CA 27.29 (SERIAL MONITOR): CA 27.29: 11.9 U/mL (ref 0.0–38.6)

## 2017-08-10 ENCOUNTER — Telehealth: Payer: Self-pay | Admitting: Family Medicine

## 2017-08-10 DIAGNOSIS — E782 Mixed hyperlipidemia: Secondary | ICD-10-CM

## 2017-08-10 DIAGNOSIS — E038 Other specified hypothyroidism: Secondary | ICD-10-CM

## 2017-08-10 DIAGNOSIS — Z7189 Other specified counseling: Secondary | ICD-10-CM

## 2017-08-10 DIAGNOSIS — E559 Vitamin D deficiency, unspecified: Secondary | ICD-10-CM

## 2017-08-10 NOTE — Telephone Encounter (Signed)
-----   Message from Eustace Pen, LPN sent at 07/06/252  4:22 PM EDT ----- Regarding: Labs 5/1 Lab orders needed. Thank you.  Insurance:  [employer]

## 2017-08-12 DIAGNOSIS — Z7189 Other specified counseling: Secondary | ICD-10-CM | POA: Insufficient documentation

## 2017-08-15 ENCOUNTER — Ambulatory Visit (INDEPENDENT_AMBULATORY_CARE_PROVIDER_SITE_OTHER): Payer: Medicare HMO

## 2017-08-15 ENCOUNTER — Ambulatory Visit: Payer: Medicare HMO

## 2017-08-15 VITALS — BP 120/70 | HR 48 | Temp 97.8°F | Ht 65.0 in | Wt 113.2 lb

## 2017-08-15 DIAGNOSIS — E782 Mixed hyperlipidemia: Secondary | ICD-10-CM

## 2017-08-15 DIAGNOSIS — E038 Other specified hypothyroidism: Secondary | ICD-10-CM

## 2017-08-15 DIAGNOSIS — E559 Vitamin D deficiency, unspecified: Secondary | ICD-10-CM | POA: Diagnosis not present

## 2017-08-15 DIAGNOSIS — Z0000 Encounter for general adult medical examination without abnormal findings: Secondary | ICD-10-CM

## 2017-08-15 LAB — COMPREHENSIVE METABOLIC PANEL
ALK PHOS: 72 U/L (ref 39–117)
ALT: 10 U/L (ref 0–35)
AST: 14 U/L (ref 0–37)
Albumin: 4.2 g/dL (ref 3.5–5.2)
BUN: 17 mg/dL (ref 6–23)
CHLORIDE: 106 meq/L (ref 96–112)
CO2: 29 mEq/L (ref 19–32)
Calcium: 9.7 mg/dL (ref 8.4–10.5)
Creatinine, Ser: 0.87 mg/dL (ref 0.40–1.20)
GFR: 66.98 mL/min (ref >60.00)
GLUCOSE: 92 mg/dL (ref 70–99)
POTASSIUM: 4.5 meq/L (ref 3.5–5.1)
SODIUM: 141 meq/L (ref 135–145)
TOTAL PROTEIN: 7.1 g/dL (ref 6.0–8.3)
Total Bilirubin: 0.5 mg/dL (ref 0.2–1.2)

## 2017-08-15 LAB — T4, FREE: Free T4: 0.98 ng/dL (ref 0.60–1.60)

## 2017-08-15 LAB — LIPID PANEL
Cholesterol: 119 mg/dL (ref 0–200)
HDL: 52.4 mg/dL (ref >39.00)
LDL CALC: 48 mg/dL (ref 0–99)
NONHDL: 66.56
Total CHOL/HDL Ratio: 2
Triglycerides: 95 mg/dL (ref 0.0–149.0)
VLDL: 19 mg/dL (ref 0.0–40.0)

## 2017-08-15 LAB — T3, FREE: T3, Free: 3.2 pg/mL (ref 2.3–4.2)

## 2017-08-15 LAB — VITAMIN D 25 HYDROXY (VIT D DEFICIENCY, FRACTURES): VITD: 31.41 ng/mL (ref 30.00–100.00)

## 2017-08-15 LAB — TSH: TSH: 0.74 u[IU]/mL (ref 0.35–4.50)

## 2017-08-15 NOTE — Patient Instructions (Addendum)
Amy Stone , Thank you for taking time to come for your Medicare Wellness Visit. I appreciate your ongoing commitment to your health goals. Please review the following plan we discussed and let me know if I can assist you in the future.   These are the goals we discussed: Goals    . Patient Stated     Starting 08/15/2017,  I will continue to take medications as prescribed.        This is a list of the screening recommended for you and due dates:  Health Maintenance  Topic Date Due  . Colon Cancer Screening  07/02/2020*  . Flu Shot  11/15/2017  . Tetanus Vaccine  05/27/2018  . DEXA scan (bone density measurement)  Completed  . Pneumonia vaccines  Completed  *Topic was postponed. The date shown is not the original due date.   Preventive Care for Adults  A healthy lifestyle and preventive care can promote health and wellness. Preventive health guidelines for adults include the following key practices.  . A routine yearly physical is a good way to check with your health care provider about your health and preventive screening. It is a chance to share any concerns and updates on your health and to receive a thorough exam.  . Visit your dentist for a routine exam and preventive care every 6 months. Brush your teeth twice a day and floss once a day. Good oral hygiene prevents tooth decay and gum disease.  . The frequency of eye exams is based on your age, health, family medical history, use  of contact lenses, and other factors. Follow your health care provider's recommendations for frequency of eye exams.  . Eat a healthy diet. Foods like vegetables, fruits, whole grains, low-fat dairy products, and lean protein foods contain the nutrients you need without too many calories. Decrease your intake of foods high in solid fats, added sugars, and salt. Eat the right amount of calories for you. Get information about a proper diet from your health care provider, if necessary.  . Regular physical  exercise is one of the most important things you can do for your health. Most adults should get at least 150 minutes of moderate-intensity exercise (any activity that increases your heart rate and causes you to sweat) each week. In addition, most adults need muscle-strengthening exercises on 2 or more days a week.  Silver Sneakers may be a benefit available to you. To determine eligibility, you may visit the website: www.silversneakers.com or contact program at 640-387-0261 Mon-Fri between 8AM-8PM.   . Maintain a healthy weight. The body mass index (BMI) is a screening tool to identify possible weight problems. It provides an estimate of body fat based on height and weight. Your health care provider can find your BMI and can help you achieve or maintain a healthy weight.   For adults 20 years and older: ? A BMI below 18.5 is considered underweight. ? A BMI of 18.5 to 24.9 is normal. ? A BMI of 25 to 29.9 is considered overweight. ? A BMI of 30 and above is considered obese.   . Maintain normal blood lipids and cholesterol levels by exercising and minimizing your intake of saturated fat. Eat a balanced diet with plenty of fruit and vegetables. Blood tests for lipids and cholesterol should begin at age 76 and be repeated every 5 years. If your lipid or cholesterol levels are high, you are over 50, or you are at high risk for heart disease, you may need  your cholesterol levels checked more frequently. Ongoing high lipid and cholesterol levels should be treated with medicines if diet and exercise are not working.  . If you smoke, find out from your health care provider how to quit. If you do not use tobacco, please do not start.  . If you choose to drink alcohol, please do not consume more than 2 drinks per day. One drink is considered to be 12 ounces (355 mL) of beer, 5 ounces (148 mL) of wine, or 1.5 ounces (44 mL) of liquor.  . If you are 41-26 years old, ask your health care provider if you  should take aspirin to prevent strokes.  . Use sunscreen. Apply sunscreen liberally and repeatedly throughout the day. You should seek shade when your shadow is shorter than you. Protect yourself by wearing long sleeves, pants, a wide-brimmed hat, and sunglasses year round, whenever you are outdoors.  . Once a month, do a whole body skin exam, using a mirror to look at the skin on your back. Tell your health care provider of new moles, moles that have irregular borders, moles that are larger than a pencil eraser, or moles that have changed in shape or color.

## 2017-08-15 NOTE — Progress Notes (Signed)
Subjective:   Amy Stone is a 77 y.o. female who presents for Medicare Annual (Subsequent) preventive examination.  Review of Systems: N/A Cardiac Risk Factors include: advanced age (>64men, >63 women);dyslipidemia;hypertension     Objective:     Vitals: BP 120/70 (BP Location: Left Arm, Patient Position: Sitting, Cuff Size: Normal)   Pulse (!) 48   Temp 97.8 F (36.6 C) (Oral)   Ht 5\' 5"  (1.651 m) Comment: no shoes  Wt 113 lb 4 oz (51.4 kg)   SpO2 95%   BMI 18.85 kg/m   Body mass index is 18.85 kg/m.  Advanced Directives 08/15/2017 08/07/2017 02/06/2017 11/29/2016 11/08/2016 09/18/2016 08/07/2016  Does Patient Have a Medical Advance Directive? No No No No No No No  Would patient like information on creating a medical advance directive? No - Patient declined - No - Patient declined - No - Patient declined No - Patient declined -    Tobacco Social History   Tobacco Use  Smoking Status Former Smoker  . Years: 1.00  . Types: Cigarettes  . Last attempt to quit: 11/24/2005  . Years since quitting: 11.7  Smokeless Tobacco Never Used  Tobacco Comment   40 pack years      Counseling given: No Comment: 40 pack years    Clinical Intake:  Pre-visit preparation completed: Yes  Pain : No/denies pain Pain Score: 0-No pain     Nutritional Status: BMI of 19-24  Normal Nutritional Risks: None Diabetes: No  How often do you need to have someone help you when you read instructions, pamphlets, or other written materials from your doctor or pharmacy?: 1 - Never What is the last grade level you completed in school?: 8th grade  Interpreter Needed?: No  Comments: pt lives with spouse Information entered by :: LPinson, LPN  Past Medical History:  Diagnosis Date  . AAA (abdominal aortic aneurysm) ([place])   . Blood transfusion   . Breast CA ([place]) 2016   right breast with lumpectomy and rad tx  . Breast screening, unspecified   . Cancer [facility]) 2015   Bladder  . 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 Cervicalgia   . Chronic airway obstruction, not elsewhere classified   . Coronary atherosclerosis of unspecified type of vessel, native or graft 2007  . Epigastric pain   . Myocardial infarction ([place]) 2007  . Neoplasm of uncertain behavior of skin   . Osteoarthrosis, unspecified whether generalized or localized, unspecified site   . Osteoporosis, unspecified   . Other and unspecified hyperlipidemia   . Other chest pain   . Peripheral vascular disease ([place])   . PONV (postoperative nausea and vomiting)   . Shortness of breath   . Thoracic aneurysm, ruptured ([place])   . Thyroid nodule    seen on CT Scan 07/19/2011  . Unspecified essential hypertension   . Unspecified hypothyroidism   . Unspecified tinnitus   . UTI (lower urinary tract infection)   . Wears dentures    full upper and lower   Past Surgical History:  Procedure Laterality Date  . ABDOMINAL AORTAGRAM N/A 05/15/2011   Procedure: ABDOMINAL Maxcine Ham;  Surgeon: Angelia Mould, MD;  Location: [facility] CATH LAB;  Service: Cardiovascular;  Laterality: N/A;  . ABDOMINAL HYSTERECTOMY    . acute inferior mi  11/2005  . AORTA - BILATERAL FEMORAL ARTERY BYPASS GRAFT  07/25/2011   Procedure: AORTA BIFEMORAL BYPASS GRAFT;  Surgeon: Angelia Mould, MD;  Location: [place];  Service:  Vascular;  Laterality: Bilateral;  . BREAST BIOPSY Right 2016   positive  . BREAST LUMPECTOMY Right Feb. 2016   F/U with radiation  . CATARACT EXTRACTION W/PHACO Left 09/18/2016   Procedure: CATARACT EXTRACTION PHACO AND INTRAOCULAR LENS PLACEMENT ([place]) Left;  Surgeon: Leandrew Koyanagi, MD;  Location: [place];  Service: Ophthalmology;  Laterality: Left;  . CATARACT EXTRACTION W/PHACO Right 11/29/2016   Procedure: CATARACT EXTRACTION PHACO AND INTRAOCULAR LENS PLACEMENT ([place])  Right;  Surgeon: Leandrew Koyanagi, MD;  Location: [place];  Service: Ophthalmology;  Laterality: Right;  . CORONARY ARTERY  BYPASS GRAFT  11/2005  . goiter removal  1979  . PORT WINE STAIN REMOVAL W/ LASER  2004   bladder cancer  . PR VEIN BYPASS GRAFT,AORTO-FEM-POP  July 25, 2011   Family History  Problem Relation Age of Onset  . Alzheimer's disease Father   . Parkinsonism Father   . Heart defect Mother        enlarged heart  . Heart attack Mother   . Hypertension Sister   . Diabetes Sister   . Heart disease Sister   . Hyperlipidemia Sister   . Breast cancer Sister 70  . Hypothyroidism Sister   . Diabetes Sister   . Aneurysm Paternal Grandfather   . Breast cancer Daughter 71   Social History   Socioeconomic History  . Marital status: Married    Spouse name: Not on file  . Number of children: Not on file  . Years of education: Not on file  . Highest education level: Not on file  Occupational History  . Not on file  Social Needs  . Financial resource strain: Not on file  . Food insecurity:    Worry: Not on file    Inability: Not on file  . Transportation needs:    Medical: Not on file    Non-medical: Not on file  Tobacco Use  . Smoking status: Former Smoker    Years: 1.00    Types: Cigarettes    Last attempt to quit: 11/24/2005    Years since quitting: 11.7  . Smokeless tobacco: Never Used  . Tobacco comment: 40 pack years   Substance and Sexual Activity  . Alcohol use: Yes    Alcohol/week: 0.6 oz    Types: 1 Glasses of wine per week    Comment: wine for cramps  . Drug use: No  . Sexual activity: Yes    Birth control/protection: Post-menopausal  Lifestyle  . Physical activity:    Days per week: Not on file    Minutes per session: Not on file  . Stress: Not on file  Relationships  . Social connections:    Talks on phone: Not on file    Gets together: Not on file    Attends religious service: Not on file    Active member of club or organization: Not on file    Attends meetings of clubs or organizations: Not on file    Relationship status: Not on file  Other Topics Concern    . Not on file  Social History Narrative   Regular exercise- walking    Diet: heart healthy diet.    Outpatient Encounter Medications as of 08/15/2017  Medication Sig  . aspirin 81 MG tablet Take 81 mg by mouth daily.  [name] atenolol (TENORMIN) 25 MG tablet TAKE ONE TABLET BY MOUTH ONCE DAILY  . atorvastatin (LIPITOR) 80 MG tablet TAKE ONE TABLET BY MOUTH AT BEDTIME  . letrozole [facility]) 2.5  MG tablet Take 1 tablet (2.5 mg total) by mouth daily.  [name] levothyroxine (SYNTHROID, LEVOTHROID) 50 MCG tablet TAKE ONE-HALF TABLET BY MOUTH ONCE DAILY  . losartan (COZAAR) 50 MG tablet TAKE ONE TABLET BY MOUTH ONCE DAILY  . VENTOLIN HFA 108 (90 Base) MCG/ACT inhaler INHALE 2 PUFFS BY MOUTH EVERY 6 HOURS AS NEEDED FOR WHEEZING OR SHORTNESS OF BREATH  . tiotropium (SPIRIVA HANDIHALER) 18 MCG inhalation capsule Place 1 capsule (18 mcg total) daily into inhaler and inhale. (Patient not taking: Reported on 08/07/2017)  . [DISCONTINUED] doxycycline (VIBRA-TABS) 100 MG tablet Take 1 tablet (100 mg total) 2 (two) times daily by mouth. (Patient not taking: Reported on 08/07/2017)   No facility-administered encounter medications on file as of 08/15/2017.     Activities of Daily Living In your present state of health, do you have any difficulty performing the following activities: 08/15/2017 11/29/2016  Hearing? Tempie Donning  Vision? N N  Difficulty concentrating or making decisions? N N  Walking or climbing stairs? N N  Dressing or bathing? N N  Doing errands, shopping? Y -  Conservation officer, nature and eating ? N -  Using the Toilet? N -  In the past six months, have you accidently leaked urine? Y -  Do you have problems with loss of bowel control? N -  Managing your Medications? N -  Managing your Finances? N -  Housekeeping or managing your Housekeeping? N -  Some recent data might be hidden    Patient Care Team: Jinny Sanders, MD as PCP - General (Family Medicine) [name], MD (Cardiology) Angelia Mould,  MD as Attending Physician (Vascular Surgery) Lequita Asal, MD as Referring Physician (Hematology and Oncology)    Assessment:   This is a routine wellness examination for Amy Stone.  Hearing Screening Comments: Bilateral hearing aids Vision Screening Comments: Last vision exam in Jan 2019 @ [facility]  Exercise Activities and Dietary recommendations Current Exercise Habits: The patient does not participate in regular exercise at present, Exercise limited by: None identified  Goals    . Patient Stated     Starting 08/15/2017,  I will continue to take medications as prescribed.        Fall Risk Fall Risk  08/15/2017 05/09/2016 05/04/2015 04/28/2014 04/03/2013  Falls in the past year? No Yes No No No  Comment - pt had two accidental falls during winter storm while cleaning ice off porch - - -  Number falls in past yr: - 2 or more - - -  Injury with Fall? - No - - -   Depression Screen PHQ 2/9 Scores 08/15/2017 05/09/2016 05/04/2015 04/28/2014  PHQ - 2 Score 0 0 0 0  PHQ- 9 Score 0 - - -     Cognitive Function MMSE - Mini Mental State Exam 08/15/2017 05/09/2016  Orientation to time 5 5  Orientation to Place 5 5  Registration 3 3  Attention/ Calculation 0 0  Recall 3 3  Language- name 2 objects 0 0  Language- repeat 1 1  Language- follow 3 step command 3 3  Language- read & follow direction 0 0  Write a sentence 0 0  Copy design 0 0  Total score 20 20     PLEASE NOTE: A Mini-Cog screen was completed. Maximum score is 20. A value of 0 denotes this part of [place] MMSE was not completed or the patient failed this part of the Mini-Cog screening.   Mini-Cog Screening Orientation to  Time - Max 5 pts Orientation to Place - Max 5 pts Registration - Max 3 pts Recall - Max 3 pts Language Repeat - Max 1 pts Language Follow 3 Step Command - Max 3 pts     Immunization History  Administered Date(s) Administered  . Influenza Split 01/19/2011, 12/22/2011  . Influenza Whole  04/15/2007, 02/05/2008, 01/15/2009, 01/12/2010  . Influenza, High Dose Seasonal PF 01/20/2016, 02/02/2017  . Influenza,inj,Quad PF,6+ Mos 01/21/2013, 01/22/2014, 02/05/2015  . Pneumococcal Conjugate-13 01/22/2014  . Pneumococcal Polysaccharide-23 05/27/2008  . Td 05/27/2008  . Zoster 01/23/2013    Screening Tests Health Maintenance  Topic Date Due  . COLONOSCOPY  07/02/2020 (Originally 07/04/2015)  . INFLUENZA VACCINE  11/15/2017  . TETANUS/TDAP  05/27/2018  . DEXA SCAN  Completed  . PNA vac Low Risk Adult  Completed      Plan:     I have personally reviewed, addressed, and noted the following in the patient's chart:  A. Medical and social history B. Use of alcohol, tobacco or illicit drugs  C. Current medications and supplements D. Functional ability and status E.  Nutritional status F.  Physical activity G. Advance directives H. List of other physicians I.  Hospitalizations, surgeries, and ER visits in previous 12 months J.  [place] to include hearing, vision, cognitive, depression L. Referrals and appointments - none  In addition, I have reviewed and discussed with patient certain preventive protocols, quality metrics, and best practice recommendations. A written personalized care plan for preventive services as well as general preventive health recommendations were provided to patient.  See attached scanned questionnaire for additional information.   Signed,   Lindell Noe, MHA, BS, LPN Health Coach

## 2017-08-15 NOTE — Progress Notes (Signed)
PCP notes:   Health maintenance:  No gaps identified.  Abnormal screenings:   None  Patient concerns:   None  Nurse concerns:  None  Next PCP appt:   08/31/17 @ 0915

## 2017-08-15 NOTE — Progress Notes (Signed)
I reviewed health advisor's note, was available for consultation, and agree with documentation and plan.   Signed,  Giulio Bertino T. Taiwan Millon, MD  

## 2017-08-17 ENCOUNTER — Encounter: Payer: Medicare HMO | Admitting: Family Medicine

## 2017-08-24 ENCOUNTER — Other Ambulatory Visit: Payer: Self-pay | Admitting: Family Medicine

## 2017-08-31 ENCOUNTER — Ambulatory Visit (INDEPENDENT_AMBULATORY_CARE_PROVIDER_SITE_OTHER): Payer: Medicare HMO | Admitting: Family Medicine

## 2017-08-31 ENCOUNTER — Encounter: Payer: Self-pay | Admitting: Family Medicine

## 2017-08-31 VITALS — BP 130/60 | HR 70 | Temp 98.2°F | Ht 65.0 in | Wt 113.8 lb

## 2017-08-31 DIAGNOSIS — E782 Mixed hyperlipidemia: Secondary | ICD-10-CM

## 2017-08-31 DIAGNOSIS — Z0000 Encounter for general adult medical examination without abnormal findings: Secondary | ICD-10-CM

## 2017-08-31 DIAGNOSIS — I70219 Atherosclerosis of native arteries of extremities with intermittent claudication, unspecified extremity: Secondary | ICD-10-CM

## 2017-08-31 DIAGNOSIS — I10 Essential (primary) hypertension: Secondary | ICD-10-CM | POA: Diagnosis not present

## 2017-08-31 DIAGNOSIS — Z853 Personal history of malignant neoplasm of breast: Secondary | ICD-10-CM | POA: Diagnosis not present

## 2017-08-31 DIAGNOSIS — J449 Chronic obstructive pulmonary disease, unspecified: Secondary | ICD-10-CM | POA: Diagnosis not present

## 2017-08-31 DIAGNOSIS — E038 Other specified hypothyroidism: Secondary | ICD-10-CM

## 2017-08-31 DIAGNOSIS — I771 Stricture of artery: Secondary | ICD-10-CM

## 2017-08-31 DIAGNOSIS — I714 Abdominal aortic aneurysm, without rupture, unspecified: Secondary | ICD-10-CM

## 2017-08-31 DIAGNOSIS — Z8551 Personal history of malignant neoplasm of bladder: Secondary | ICD-10-CM | POA: Diagnosis not present

## 2017-08-31 NOTE — Progress Notes (Signed)
Subjective:    Patient ID: Amy Stone, female    DOB: 1940/10/27, 77 y.o.   MRN: [id number]  HPI  The patient presents for  complete physical and review of chronic health problems.   The patient saw Candis Musa, LPN for medicare wellness. Note reviewed in detail and important notes copied below.  Health maintenance:  No gaps identified.  Abnormal screenings:   None   08/31/17 Today:   Hypertension:   Good control on atenolol and lotensin. BP Readings from Last 3 Encounters:  08/31/17 130/60  08/15/17 120/70  08/07/17 113/68  Using medication without problems or lightheadedness:  Chest pain with exertion: Edema: Short of breath: Average home BPs: Other issues:  Elevated Cholesterol:High risk, on high dose statin lipitor 80 mg daily, good control. Lab Results  Component Value Date   CHOL 119 08/15/2017   HDL 52.40 08/15/2017   LDLCALC 48 08/15/2017   TRIG 95.0 08/15/2017   CHOLHDL 2 08/15/2017  Using medications without problems: Muscle aches:  Diet compliance: good Exercise: walking  Off and on, playing with grandbaby. Other complaints:  Hypothyroid  Stable on levothyroxine. Lab Results  Component Value Date   TSH 0.74 08/15/2017   Bladder cancer: followed by Dr. Rogers Blocker.  COPD, moderate: well controlled per pt.. Could not afford spiriva or adavir.. Using albuterol as needed off and on.  Breast cancer right breast stage 1b followed by ONC. Dr. Felicita Gage S/P surgical resection/ partial mastectomy and radiation..  Status post aortic bifemoral bypass graft in April 2013 for AAA and significant aortoiliac occlusive disease. She has no claudication symptoms with walking, nosigns of ischemia in her feet/legs, no back or abdominal pain.  Followed by vascular.  Social History /Family History/Past Medical History reviewed in detail and updated in EMR if needed. Blood pressure 130/60, pulse 70, temperature 98.2 F (36.8 C), temperature source Oral,  height 5\' 5"  (1.651 m), weight 113 lb 12 oz (51.6 kg).   Review of Systems  Constitutional: Negative for fatigue and fever.  HENT: Negative for congestion.   Eyes: Negative for pain.  Respiratory: Negative for cough and shortness of breath.   Cardiovascular: Negative for chest pain, palpitations and leg swelling.  Gastrointestinal: Negative for abdominal pain.  Genitourinary: Negative for dysuria and vaginal bleeding.  Musculoskeletal: Negative for back pain.  Neurological: Negative for syncope, light-headedness and headaches.  Psychiatric/Behavioral: Negative for dysphoric mood.       Objective:   Physical Exam  Constitutional: Vital signs are normal. She appears well-developed and well-nourished. She is cooperative.  Non-toxic appearance. She does not appear ill. No distress.  HENT:  Head: Normocephalic.  Right Ear: Hearing, tympanic membrane, external ear and ear canal normal.  Left Ear: Hearing, tympanic membrane, external ear and ear canal normal.  Nose: Nose normal.  Eyes: Pupils are equal, round, and reactive to light. Conjunctivae, EOM and lids are normal. Lids are everted and swept, no foreign bodies found.  Neck: Trachea normal and normal range of motion. Neck supple. Carotid bruit is not present. No thyroid mass and no thyromegaly present.  Cardiovascular: Normal rate, regular rhythm, S1 normal, S2 normal, normal heart sounds and intact distal pulses. Exam reveals no gallop.  No murmur heard. Pulmonary/Chest: Effort normal and breath sounds normal. No respiratory distress. She has no wheezes. She has no rhonchi. She has no rales.  Abdominal: Soft. Normal appearance and bowel sounds are normal. She exhibits no distension, no fluid wave, no abdominal bruit and no mass. There is no  hepatosplenomegaly. There is no tenderness. There is no rebound, no guarding and no CVA tenderness. No hernia.  Lymphadenopathy:    She has no cervical adenopathy.    She has no axillary  adenopathy.  Neurological: She is alert. She has normal strength. No cranial nerve deficit or sensory deficit.  Skin: Skin is warm, dry and intact. No rash noted.  Psychiatric: Her speech is normal and behavior is normal. Judgment normal. Her mood appears not anxious. Cognition and memory are normal. She does not exhibit a depressed mood.          Assessment & Plan:  The patient's preventative maintenance and recommended screening tests for an annual wellness exam were reviewed in full today. Brought up to date unless services declined.  Counselled on the importance of diet, exercise, and its role in overall health and mortality. The patient's FH and SH was reviewed, including their home life, tobacco status, and drug and alcohol status.   Former smoker Quit 11 years ago, 44 Years but 1/2 pack per day: 25 pack year history.  Not interested in lung cancer screening program. Vaccines:Uptodate. Mammogram: Right ductal breast cancer,February of 2016 status post lumpectomy and sentinel lymph node evaluation (June 02, 2014) followoed by onc. Colon: 2009 Dr. Kristopher Glee, polyp, due for repeat in 2019 DEXA: 2016 Osteoporosis -3.2 hip on Femara currently. Stopped fosamax in past given cost, repeat due but she refuses eval or treatment at this time. " I a,m tired of medication" Discussed in detail with onc at last OV.  DVE/PAP: not indicated, TAH for non-cancer reasons.   Fall risk: reviewed home safety and fall prevention.

## 2017-08-31 NOTE — Assessment & Plan Note (Signed)
Follwoed by  urology Dr. Rogers Blocker.

## 2017-08-31 NOTE — Assessment & Plan Note (Signed)
Followed by vascuar. Table.

## 2017-08-31 NOTE — Assessment & Plan Note (Signed)
Well controlled. Continue current medication.  

## 2017-08-31 NOTE — Assessment & Plan Note (Signed)
Followed by vascular MD.

## 2017-08-31 NOTE — Assessment & Plan Note (Signed)
Followed by Oncology.

## 2017-08-31 NOTE — Assessment & Plan Note (Signed)
Stable control for pt using only albuterol as needed.. Not requiring daily. She is not currently interested in  Controller.

## 2017-08-31 NOTE — Assessment & Plan Note (Signed)
Followed by vascular 

## 2017-09-12 ENCOUNTER — Other Ambulatory Visit: Payer: Self-pay | Admitting: Family Medicine

## 2017-11-05 ENCOUNTER — Other Ambulatory Visit: Payer: Self-pay | Admitting: Family Medicine

## 2017-11-14 ENCOUNTER — Encounter: Payer: Self-pay | Admitting: Radiation Oncology

## 2017-11-14 ENCOUNTER — Other Ambulatory Visit: Payer: Self-pay

## 2017-11-14 ENCOUNTER — Ambulatory Visit
Admission: RE | Admit: 2017-11-14 | Discharge: 2017-11-14 | Disposition: A | Discharge status: Home / Self Care | Payer: Medicare HMO | Source: Ambulatory Visit | Attending: Radiation Oncology | Admitting: Radiation Oncology

## 2017-11-14 VITALS — BP 132/76 | HR 50 | Resp 18 | Wt 116.6 lb

## 2017-11-14 DIAGNOSIS — Z170 Estrogen receptor positive status [ER+]: Secondary | ICD-10-CM | POA: Diagnosis not present

## 2017-11-14 DIAGNOSIS — Z923 Personal history of irradiation: Secondary | ICD-10-CM | POA: Diagnosis not present

## 2017-11-14 DIAGNOSIS — Z79811 Long term (current) use of aromatase inhibitors: Secondary | ICD-10-CM | POA: Insufficient documentation

## 2017-11-14 DIAGNOSIS — C50121 Malignant neoplasm of central portion of right male breast: Secondary | ICD-10-CM

## 2017-11-14 DIAGNOSIS — C50111 Malignant neoplasm of central portion of right female breast: Secondary | ICD-10-CM | POA: Diagnosis not present

## 2017-11-14 NOTE — Progress Notes (Signed)
Radiation Oncology Follow up Note  Name: Amy Stone   Date:   11/14/2017 MRN:  [id number] DOB: Dec 05, 1940    This 76 y.o. female presents to the clinic today for 3 year follow-up status post whole breast radiation to her right breast for stage I invasive mammary carcinoma.  REFERRING PROVIDER: Jinny Sanders, MD  HPI: patient is a 77 year old female now seen out 3 years having completed whole breast radiation to her right breast for stage I invasive mammary carcinoma ER/PR positive. Seen today in routine follow-up she is doing well. She specifically denies breast tenderness cough or bone pain.Marland Kitchenammograms back in February which I have reviewed were BI-RADS 2 benign.she's currently on Femara tolerate that well without side effect.  COMPLICATIONS OF TREATMENT: none  FOLLOW UP COMPLIANCE: keeps appointments   PHYSICAL EXAM:  BP 132/76   Pulse (!) 50   Resp 18   Wt 116 lb 10 oz (52.9 kg)   BMI 19.41 kg/m  Lungs are clear to A&P cardiac examination essentially unremarkable with regular rate and rhythm. No dominant mass or nodularity is noted in either breast in 2 positions examined. Incision is well-healed. No axillary or supraclavicular adenopathy is appreciated. Cosmetic result is excellent. Well-developed well-nourished patient in NAD. HEENT reveals PERLA, EOMI, discs not visualized.  Oral cavity is clear. No oral mucosal lesions are identified. Neck is clear without evidence of cervical or supraclavicular adenopathy. Lungs are clear to A&P. Cardiac examination is essentially unremarkable with regular rate and rhythm without murmur rub or thrill. Abdomen is benign with no organomegaly or masses noted. Motor sensory and DTR levels are equal and symmetric in the upper and lower extremities. Cranial nerves II through XII are grossly intact. Proprioception is intact. No peripheral adenopathy or edema is identified. No motor or sensory levels are noted. Crude visual fields are within normal  range.  RADIOLOGY RESULTS: mammograms reviewed and compatible with the above-stated findings  PLAN: present time patient continues to do well with no evidence of disease. I'm please were overall progress. I've asked to see her back in 1 year for follow-up. She is a rescheduled for follow-up mammograms. She continues on Femara without side effect. Patient knows to call with any concerns.  I would like to take this opportunity to thank you for allowing me to participate in the care of your patient.Noreene Filbert, MD

## 2017-12-25 DIAGNOSIS — Z8551 Personal history of malignant neoplasm of bladder: Secondary | ICD-10-CM | POA: Diagnosis not present

## 2017-12-25 DIAGNOSIS — N393 Stress incontinence (female) (male): Secondary | ICD-10-CM | POA: Diagnosis not present

## 2018-01-02 DIAGNOSIS — S79911A Unspecified injury of right hip, initial encounter: Secondary | ICD-10-CM | POA: Diagnosis not present

## 2018-01-08 DIAGNOSIS — M7061 Trochanteric bursitis, right hip: Secondary | ICD-10-CM | POA: Diagnosis not present

## 2018-01-08 DIAGNOSIS — M25551 Pain in right hip: Secondary | ICD-10-CM | POA: Diagnosis not present

## 2018-02-12 ENCOUNTER — Other Ambulatory Visit: Payer: Self-pay

## 2018-02-12 ENCOUNTER — Inpatient Hospital Stay: Payer: Medicare HMO | Admitting: Hematology and Oncology

## 2018-02-12 ENCOUNTER — Inpatient Hospital Stay: Payer: Medicare HMO | Attending: Hematology and Oncology

## 2018-02-12 ENCOUNTER — Encounter: Payer: Self-pay | Admitting: Hematology and Oncology

## 2018-02-12 VITALS — BP 160/68 | HR 59 | Temp 97.8°F | Resp 18 | Ht 65.0 in | Wt 117.0 lb

## 2018-02-12 DIAGNOSIS — Z170 Estrogen receptor positive status [ER+]: Secondary | ICD-10-CM | POA: Diagnosis not present

## 2018-02-12 DIAGNOSIS — Z87891 Personal history of nicotine dependence: Secondary | ICD-10-CM | POA: Insufficient documentation

## 2018-02-12 DIAGNOSIS — M810 Age-related osteoporosis without current pathological fracture: Secondary | ICD-10-CM

## 2018-02-12 DIAGNOSIS — I10 Essential (primary) hypertension: Secondary | ICD-10-CM | POA: Insufficient documentation

## 2018-02-12 DIAGNOSIS — C50911 Malignant neoplasm of unspecified site of right female breast: Secondary | ICD-10-CM

## 2018-02-12 DIAGNOSIS — Z8551 Personal history of malignant neoplasm of bladder: Secondary | ICD-10-CM | POA: Diagnosis not present

## 2018-02-12 DIAGNOSIS — Z7982 Long term (current) use of aspirin: Secondary | ICD-10-CM | POA: Insufficient documentation

## 2018-02-12 DIAGNOSIS — Z79899 Other long term (current) drug therapy: Secondary | ICD-10-CM

## 2018-02-12 DIAGNOSIS — C50111 Malignant neoplasm of central portion of right female breast: Secondary | ICD-10-CM

## 2018-02-12 LAB — CBC WITH DIFFERENTIAL/PLATELET
Abs Immature Granulocytes: 0.03 10*3/uL (ref 0.00–0.07)
Basophils Absolute: 0.1 10*3/uL (ref 0.0–0.1)
Basophils Relative: 1 %
Eosinophils Absolute: 0.2 10*3/uL (ref 0.0–0.5)
Eosinophils Relative: 2 %
HCT: 36.6 % (ref 36.0–46.0)
Hemoglobin: 11.7 g/dL — ABNORMAL LOW (ref 12.0–15.0)
Immature Granulocytes: 0 %
Lymphocytes Relative: 18 %
Lymphs Abs: 1.7 10*3/uL (ref 0.7–4.0)
MCH: 28.5 pg (ref 26.0–34.0)
MCHC: 32 g/dL (ref 30.0–36.0)
MCV: 89.1 fL (ref 80.0–100.0)
Monocytes Absolute: 0.7 10*3/uL (ref 0.1–1.0)
Monocytes Relative: 7 %
Neutro Abs: 6.7 10*3/uL (ref 1.7–7.7)
Neutrophils Relative %: 72 %
Platelets: 234 10*3/uL (ref 150–400)
RBC: 4.11 MIL/uL (ref 3.87–5.11)
RDW: 13.6 % (ref 11.5–15.5)
WBC: 9.3 10*3/uL (ref 4.0–10.5)
nRBC: 0 % (ref 0.0–0.2)

## 2018-02-12 LAB — COMPREHENSIVE METABOLIC PANEL
ALT: 13 U/L (ref 0–44)
AST: 20 U/L (ref 15–41)
Albumin: 4.2 g/dL (ref 3.5–5.0)
Alkaline Phosphatase: 83 U/L (ref 38–126)
Anion gap: 5 (ref 5–15)
BUN: 11 mg/dL (ref 8–23)
CO2: 28 mmol/L (ref 22–32)
Calcium: 9.5 mg/dL (ref 8.9–10.3)
Chloride: 102 mmol/L (ref 98–111)
Creatinine, Ser: 0.82 mg/dL (ref 0.44–1.00)
GFR calc Af Amer: >60 mL/min (ref >60)
GFR calc non Af Amer: >60 mL/min (ref >60)
Glucose, Bld: 102 mg/dL — ABNORMAL HIGH (ref 70–99)
Potassium: 4.3 mmol/L (ref 3.5–5.1)
Sodium: 135 mmol/L (ref 135–145)
Total Bilirubin: 0.7 mg/dL (ref 0.3–1.2)
Total Protein: 7.5 g/dL (ref 6.5–8.1)

## 2018-02-12 NOTE — Progress Notes (Signed)
[facility] day:  02/12/18  Chief Complaint: Amy Stone is a 77 y.o. female with stage IB right breast cancer who is seen for 6 month assessment.  HPI:  The patient was last seen in the medical oncology clinic on 08/07/2017.   At that time, she was doing well.  She had no breast concerns.  Exam revealed stable fibrocystic changes.  Labs were unremarkable.   During the interim, patient is doing "about the same". She states, "I don't change month to month. I am always the same". Patient denies that she has experienced any B symptoms. She denies any interval infections.  Patient does not verbalize any concerns with regards to her breasts today. Patient performs monthly self breast examinations as recommended. She continues to take her Femara as prescribed.    Patient advises that she maintains an adequate appetite. She is eating well. Weight today is 117 lb (53.1 kg), which compared to her last visit to the clinic, represents a 2 pound increase.   Patient denies pain in the clinic today.   Past Medical History:  Diagnosis Date  . AAA (abdominal aortic aneurysm) ([place])   . Blood transfusion   . Breast CA ([place]) 2016   right breast with lumpectomy and rad tx  . Breast screening, unspecified   . Cancer [facility]) 2015   Bladder  . 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 Cervicalgia   . Chronic airway obstruction, not elsewhere classified   . Coronary atherosclerosis of unspecified type of vessel, native or graft 2007  . Epigastric pain   . Myocardial infarction ([place]) 2007  . Neoplasm of uncertain behavior of skin   . Osteoarthrosis, unspecified whether generalized or localized, unspecified site   . Osteoporosis, unspecified   . Other and unspecified hyperlipidemia   . Other chest pain   . Peripheral vascular disease ([place])   . PONV (postoperative nausea and vomiting)   . Shortness of breath   . Thoracic aneurysm, ruptured ([place])   .  Thyroid nodule    seen on CT Scan 07/19/2011  . Unspecified essential hypertension   . Unspecified hypothyroidism   . Unspecified tinnitus   . UTI (lower urinary tract infection)   . Wears dentures    full upper and lower    Past Surgical History:  Procedure Laterality Date  . ABDOMINAL AORTAGRAM N/A 05/15/2011   Procedure: ABDOMINAL Maxcine Ham;  Surgeon: Angelia Mould, MD;  Location: [facility] C[facility] LAB;  Service: Cardiovascular;  Laterality: N/A;  . ABDOMINAL HYSTERECTOMY    . acute inferior mi  11/2005  . AORTA - BILATERAL FEMORAL ARTERY BYPASS GRAFT  07/25/2011   Procedure: AORTA BIFEMORAL BYPASS GRAFT;  Surgeon: Angelia Mould, MD;  Location: [place];  Service: Vascular;  Laterality: Bilateral;  . BREAST BIOPSY Right 2016   positive  . BREAST LUMPECTOMY Right Feb. 2016   F/U with radiation  . CATARACT EXTRACTION W/PHACO Left 09/18/2016   Procedure: CATARACT EXTRACTION PHACO AND INTRAOCULAR LENS PLACEMENT ([place]) Left;  Surgeon: Leandrew Koyanagi, MD;  Location: [place];  Service: Ophthalmology;  Laterality: Left;  . CATARACT EXTRACTION W/PHACO Right 11/29/2016   Procedure: CATARACT EXTRACTION PHACO AND INTRAOCULAR LENS PLACEMENT ([place])  Right;  Surgeon: Leandrew Koyanagi, MD;  Location: [place];  Service: Ophthalmology;  Laterality: Right;  . CORONARY ARTERY BYPASS GRAFT  11/2005  . goiter removal  1979  . PORT WINE STAIN REMOVAL W/ LASER  2004  bladder cancer  . PR VEIN BYPASS GRAFT,AORTO-FEM-POP  July 25, 2011    Family History  Problem Relation Age of Onset  . Alzheimer's disease Father   . Parkinsonism Father   . Heart defect Mother        enlarged heart  . Heart attack Mother   . Hypertension Sister   . Diabetes Sister   . Heart disease Sister   . Hyperlipidemia Sister   . Breast cancer Sister 21  . Hypothyroidism Sister   . Diabetes Sister   . Aneurysm Paternal Grandfather   . Breast cancer Daughter 11    Social History:  reports  that she quit smoking about 12 years ago. Her smoking use included cigarettes. She quit after 1.00 year of use. She has never used smokeless tobacco. She reports that she drinks about 1.0 standard drinks of alcohol per week. She reports that she does not use drugs.  She quit smoking on 11/24/2005.  The patient is alone today.  Allergies:  Allergies  Allergen Reactions  . Iohexol      Code: HIVES, Desc: CONTRAST REACTION OF HIVES (LARGE WHELTS DEVELOPED OVER ENTIRE BODY/MMS   . Prednisone Other (See Comments)    hallucinations    Current Medications: Current Outpatient Medications  Medication Sig Dispense Refill  . aspirin 81 MG tablet Take 81 mg by mouth daily.    [name] atenolol (TENORMIN) 25 MG tablet TAKE 1 TABLET BY MOUTH ONCE DAILY 90 tablet 2  . atorvastatin (LIPITOR) 80 MG tablet TAKE 1 TABLET BY MOUTH AT BEDTIME 90 tablet 3  . letrozole (FEMARA) 2.5 MG tablet Take 1 tablet (2.5 mg total) by mouth daily. 90 tablet 3  . levothyroxine (SYNTHROID, LEVOTHROID) 50 MCG tablet TAKE 1/2 (ONE-HALF) TABLET BY MOUTH ONCE DAILY 45 tablet 3  . losartan (COZAAR) 50 MG tablet TAKE 1 TABLET BY MOUTH ONCE DAILY 90 tablet 1  . VENTOLIN HFA 108 (90 Base) MCG/ACT inhaler INHALE 2 PUFFS BY MOUTH EVERY 6 HOURS AS NEEDED FOR WHEEZING OR SHORTNESS OF BREATH (Patient not taking: Reported on 02/12/2018) 18 each 11   No current facility-administered medications for this visit.     Review of Systems  Constitutional: Negative.  Negative for diaphoresis, fever, malaise/fatigue and weight loss (up 2 pounds).       "I feel pretty good.  I am doing about the same.  I don't change from month to month."  HENT: Negative.  Negative for congestion, ear discharge, ear pain, nosebleeds, sinus pain, sore throat and tinnitus.   Eyes: Negative.  Negative for double vision, photophobia, pain, discharge and redness.  Respiratory: Negative.  Negative for cough, hemoptysis, sputum production and shortness of breath.    Cardiovascular: Negative.  Negative for chest pain, palpitations, orthopnea, leg swelling and PND.  Gastrointestinal: Positive for constipation. Negative for abdominal pain, blood in stool, diarrhea, melena, nausea and vomiting.  Genitourinary: Negative.  Negative for dysuria, frequency, hematuria and urgency.  Musculoskeletal: Negative.  Negative for back pain, falls, joint pain and myalgias.  Skin: Negative.  Negative for itching and rash.  Neurological: Negative.  Negative for dizziness, tremors, sensory change, speech change, focal weakness, weakness and headaches.  Endo/Heme/Allergies: Negative.  Does not bruise/bleed easily.  Psychiatric/Behavioral: Negative.  Negative for depression and memory loss. The patient is not nervous/anxious and does not have insomnia.   All other systems reviewed and are negative.  Performance status (ECOG): 1 - Symptomatic but completely ambulatory  Vital Signs BP (!) 160/68 (BP Location: Left  Arm, Patient Position: Sitting)   Pulse (!) 59   Temp 97.8 F (36.6 C) (Oral)   Resp 18   Ht _0  (1.651 m)   Wt 117 lb (53.1 kg)   SpO2 95%   BMI 19.47 kg/m   Physical Exam  Constitutional: She is oriented to person, place, and time and well-developed, well-nourished, and in no distress.  Thin appearing elderly female sitting comfortably in the exam room in no acute distress.  HENT:  Head: Normocephalic and atraumatic.  Mouth/Throat: Oropharynx is clear and moist and mucous membranes are normal.  Short gray hair.  Dentures.  Eyes: Pupils are equal, round, and reactive to light. Conjunctivae and EOM are normal. No scleral icterus.  Glasses.  Light hazel eyes.  Neck: Normal range of motion. Neck supple. No tracheal deviation present. No thyromegaly present.  Cardiovascular: Regular rhythm, normal heart sounds and intact distal pulses. Bradycardia present. Exam reveals no gallop and no friction rub.  No murmur heard. Pulmonary/Chest: Effort normal and  breath sounds normal. No respiratory distress. She has no wheezes. She has no rales. Right breast exhibits skin change (healed incision; scattered mildly tender fibrocystic changes) and tenderness. Right breast exhibits no mass and no nipple discharge. Left breast exhibits skin change (fibrocystic changes superiorly). Left breast exhibits no inverted nipple, no mass, no nipple discharge and no tenderness.  Abdominal: Soft. Bowel sounds are normal. She exhibits no distension. There is no tenderness.  Musculoskeletal: Normal range of motion. She exhibits no edema or tenderness.  Lymphadenopathy:    She has no cervical adenopathy.    She has no axillary adenopathy.       Right: No inguinal and no supraclavicular adenopathy present.       Left: No inguinal and no supraclavicular adenopathy present.  Neurological: She is alert and oriented to person, place, and time.  Skin: Skin is warm and dry. No rash noted. No erythema.  Psychiatric: Mood, affect and judgment normal.  Nursing note and vitals reviewed.   Orders Only on 02/12/2018  Component Date Value Ref Range Status  . Sodium 02/12/2018 135  135 - 145 mmol/L Final  . Potassium 02/12/2018 4.3  3.5 - 5.1 mmol/L Final  . Chloride 02/12/2018 102  98 - 111 mmol/L Final  . CO2 02/12/2018 28  22 - 32 mmol/L Final  . Glucose, Bld 02/12/2018 102* 70 - 99 mg/dL Final  . BUN 02/12/2018 11  8 - 23 mg/dL Final  . Creatinine, Ser 02/12/2018 0.82  0.44 - 1.00 mg/dL Final  . Calcium 02/12/2018 9.5  8.9 - 10.3 mg/dL Final  . Total Protein 02/12/2018 7.5  6.5 - 8.1 g/dL Final  . Albumin 02/12/2018 4.2  3.5 - 5.0 g/dL Final  . AST 02/12/2018 20  15 - 41 U/L Final  . ALT 02/12/2018 13  0 - 44 U/L Final  . Alkaline Phosphatase 02/12/2018 83  38 - 126 U/L Final  . Total Bilirubin 02/12/2018 0.7  0.3 - 1.2 mg/dL Final  . GFR calc non Af Amer 02/12/2018 >60  >60 mL/min Final  . GFR calc Af Amer 02/12/2018 >60  >60 mL/min Final   Comment: (NOTE) The eGFR has  been calculated using the CKD EPI equation. This calculation has not been validated in all clinical situations. eGFR's persistently <60 mL/min signify possible Chronic Kidney Disease.   Georgiann Hahn gap 02/12/2018 5  5 - 15 Final   Performed at [facility], [address] [place], [place] [address]  .  WBC 02/12/2018 9.3  4.0 - 10.5 K/uL Final  . RBC 02/12/2018 4.11  3.87 - 5.11 MIL/uL Final  . Hemoglobin 02/12/2018 11.7* 12.0 - 15.0 g/dL Final  . HCT 02/12/2018 36.6  36.0 - 46.0 % Final  . MCV 02/12/2018 89.1  80.0 - 100.0 fL Final  . MCH 02/12/2018 28.5  26.0 - 34.0 pg Final  . MCHC 02/12/2018 32.0  30.0 - 36.0 g/dL Final  . RDW 02/12/2018 13.6  11.5 - 15.5 % Final  . Platelets 02/12/2018 234  150 - 400 K/uL Final  . nRBC 02/12/2018 0.0  0.0 - 0.2 % Final  . Neutrophils Relative % 02/12/2018 72  % Final  . Neutro Abs 02/12/2018 6.7  1.7 - 7.7 K/uL Final  . Lymphocytes Relative 02/12/2018 18  % Final  . Lymphs Abs 02/12/2018 1.7  0.7 - 4.0 K/uL Final  . Monocytes Relative 02/12/2018 7  % Final  . Monocytes Absolute 02/12/2018 0.7  0.1 - 1.0 K/uL Final  . Eosinophils Relative 02/12/2018 2  % Final  . Eosinophils Absolute 02/12/2018 0.2  0.0 - 0.5 K/uL Final  . Basophils Relative 02/12/2018 1  % Final  . Basophils Absolute 02/12/2018 0.1  0.0 - 0.1 K/uL Final  . Immature Granulocytes 02/12/2018 0  % Final  . Abs Immature Granulocytes 02/12/2018 0.03  0.00 - 0.07 K/uL Final   Performed at [facility], [address] [place], [place] [address]    Assessment:  Amy Stone is a 77 y.o. female with stage IB right breast cancer s/p partial mastectomy on 06/02/2014.  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  Tumor was ER positive (> 90%), PR positive (1-10%), and Her2/neu negative by FISH.  Two sentinel lymph nodes were negative.  Pathologic stage was T1bN0M0.  She received 4256 cGy to the right breast from 07/01/2014 - 07/22/2014 and 1440 cGy to  the scar from 07/23/2014 - 08/05/2014.    She began Femara after completing radiation.   Bilateral mammogram on 05/29/2017 revealed no evidence of malignancy within either breast. There were stable postsurgical changes within the right breast.  CA27.29 has been followed: 8.5 on 03/24/2016, 6.9 on 08/07/2016, 8.4 on 02/06/2017, 11.9 on 08/07/2017, and 8.4 on 02/12/2018.  Bone density study on 10/08/2014 revealed osteoporosis with a T score of -3.2 in the right femur and -2.4 in L1-L4.  She was briefly on weekly Fosamax.  She is not interested in any further treatment.  She has a history of non-invasive bladder cancer.  She undergoes yearly cystoscopy (next appointment 11/2015).  Symptomatically, patient is doing well overall.  She denies any acute concerns.  No B symptoms or recent infections.  She denies any concerns with regards to her breast.  Exam is stable.  WBC 9300 (Pateros 6700).  Hemoglobin 11.7. CA27.29 normal at 8.4 U/mL.  Plan: 1.  Labs today:  CBC with diff, CMP, CA27.29. 2.  Stage IB right breast cancer  Doing well.  No symptoms.  No breast concerns.    Continues on daily endocrine therapy (letrozole) as prescribed.    Schedule mammogram on 05/29/2018.  3.  Osteoporosis  Continue calcium and vitamin D.  Patient refuses Prolia or a bisphosphonate. 4.  RTC in 6 months for MD assessment and labs (CBC with differential, CMP, CA27.29).   Honor Loh, NP  02/12/2018, 11:39 AM   I saw and evaluated the patient, participating in the key portions of the service and reviewing pertinent diagnostic studies  and records.  I reviewed the nurse practitioner's note and agree with the findings and the plan.  The assessment and plan were discussed with the patient.  Several questions were asked by the patient and answered.   Nolon Stalls, MD 02/12/2018,11:39 AM

## 2018-02-12 NOTE — Progress Notes (Signed)
No new changes noted today 

## 2018-02-13 LAB — CANCER ANTIGEN 27.29: CA 27.29: 8.4 U/mL (ref 0.0–38.6)

## 2018-02-22 ENCOUNTER — Other Ambulatory Visit: Payer: Self-pay | Admitting: Urgent Care

## 2018-04-19 ENCOUNTER — Other Ambulatory Visit: Payer: Self-pay | Admitting: Family Medicine

## 2018-05-13 ENCOUNTER — Other Ambulatory Visit: Payer: Self-pay

## 2018-05-13 DIAGNOSIS — I7409 Other arterial embolism and thrombosis of abdominal aorta: Secondary | ICD-10-CM

## 2018-05-15 ENCOUNTER — Ambulatory Visit: Payer: Medicare HMO | Admitting: Physician Assistant

## 2018-05-15 ENCOUNTER — Other Ambulatory Visit: Payer: Self-pay

## 2018-05-15 ENCOUNTER — Ambulatory Visit (HOSPITAL_COMMUNITY)
Admission: RE | Admit: 2018-05-15 | Discharge: 2018-05-15 | Disposition: A | Discharge status: Home / Self Care | Payer: Medicare HMO | Source: Ambulatory Visit | Attending: Family | Admitting: Family

## 2018-05-15 VITALS — BP 147/71 | HR 53 | Temp 97.9°F | Resp 16 | Ht 65.0 in | Wt 117.0 lb

## 2018-05-15 DIAGNOSIS — I7409 Other arterial embolism and thrombosis of abdominal aorta: Secondary | ICD-10-CM

## 2018-05-15 DIAGNOSIS — I714 Abdominal aortic aneurysm, without rupture, unspecified: Secondary | ICD-10-CM

## 2018-05-15 NOTE — Progress Notes (Signed)
History of Present Illness   Amy Stone is a 78 y.o. (Jan 20, 1941) female who presents for routine follow up to go over  ABI studies.  She is s/p aorta bifemoral bypass graft performed by Dr. Scot Dock on 07/25/2011 due to AAA as well as significant aortoiliac occlusive disease.  She denies any changes or presence of claudication, rest pain, or active tissue ischemia of bilateral lower extremities.  She is taking her aspirin and statin daily.  She is a former tobacco smoker who quit in 2007 after undergoing CABG surgery.  The patient's PMH, PSH, SH, and FamHx were reviewed and are unchanged from prior visit.  Current Outpatient Medications  Medication Sig Dispense Refill  . aspirin 81 MG tablet Take 81 mg by mouth daily.    [name] atenolol (TENORMIN) 25 MG tablet TAKE 1 TABLET BY MOUTH ONCE DAILY 90 tablet 2  . atorvastatin (LIPITOR) 80 MG tablet TAKE 1 TABLET BY MOUTH AT BEDTIME 90 tablet 3  . letrozole (FEMARA) 2.5 MG tablet TAKE 1 TABLET BY MOUTH ONCE DAILY 90 tablet 3  . levothyroxine (SYNTHROID, LEVOTHROID) 50 MCG tablet TAKE 1/2 (ONE-HALF) TABLET BY MOUTH ONCE DAILY 45 tablet 3  . losartan (COZAAR) 50 MG tablet TAKE 1 TABLET BY MOUTH ONCE DAILY 90 tablet 1  . VENTOLIN HFA 108 (90 Base) MCG/ACT inhaler INHALE 2 PUFFS BY MOUTH EVERY 6 HOURS AS NEEDED FOR WHEEZING OR SHORTNESS OF BREATH 18 each 11   No current facility-administered medications for this visit.     Allergies  Allergen Reactions  . Iohexol      Code: HIVES, Desc: CONTRAST REACTION OF HIVES (LARGE WHELTS DEVELOPED OVER ENTIRE BODY/MMS   . Prednisone Other (See Comments)    hallucinations    On ROS today: 10 system ROS is negative unless otherwise noted in HPI   Physical Examination   Vitals:   05/15/18 1038  BP: (!) 147/71  Pulse: (!) 53  Resp: 16  Temp: 97.9 F (36.6 C)  TempSrc: Oral  SpO2: 91%  Weight: 117 lb (53.1 kg)  Height: 5\' 5"  (1.651 m)   Body mass index is 19.47 kg/m.  General Alert, O x  3, WD, NAD  Pulmonary Sym exp, good B air movt,   Cardiac RRR, Nl S1, S2,  Vascular Vessel Right Left  Radial Palpable Palpable  Brachial Palpable Palpable  PT Not palpable Faintly palpable  DP Palpable Faintly palpable    Gastro- intestinal soft, non-distended, non-tender to palpation, no apparent incisional hernia  Musculo- skeletal M/S 5/5 throughout  , Extremities without ischemic changes  , No edema present, No visible varicosities , No Lipodermatosclerosis present  Neurologic Pain and light touch intact in extremities     Non-Invasive Vascular Imaging    ABI/TBIToday's ABIToday's TBIPrevious ABIPrevious TBI +-------+-----------+-----------+------------+------------+ Right  0.94       0.47       1.15        0.66         +-------+-----------+-----------+------------+------------+ Left   0.98       0.69       1.13        0.73           Medical Decision Making   Amy Stone is a 78 y.o. female who presents for routine follow up having had ABF bypass graft 07/2011 for AAA as well as aortoiliac occlusive disease.     Palpable pedal pulses; no claudication, rest pain or active tissue ischemia  ABIs unchanged over the past year  Recheck ABIs 1 year  Continue aspirin and statin daily  Follow up regularly with PCP for management of chronic medical conditions   Dagoberto Ligas PA-C [facility] Office: [phone]

## 2018-05-30 ENCOUNTER — Ambulatory Visit
Admission: RE | Admit: 2018-05-30 | Discharge: 2018-05-30 | Disposition: A | Discharge status: Home / Self Care | Payer: Medicare HMO | Source: Ambulatory Visit | Attending: Urgent Care | Admitting: Urgent Care

## 2018-05-30 DIAGNOSIS — R922 Inconclusive mammogram: Secondary | ICD-10-CM | POA: Diagnosis not present

## 2018-05-30 DIAGNOSIS — C50911 Malignant neoplasm of unspecified site of right female breast: Secondary | ICD-10-CM

## 2018-05-30 DIAGNOSIS — Z853 Personal history of malignant neoplasm of breast: Secondary | ICD-10-CM | POA: Insufficient documentation

## 2018-05-30 DIAGNOSIS — Z170 Estrogen receptor positive status [ER+]: Secondary | ICD-10-CM

## 2018-05-30 HISTORY — DX: Personal history of irradiation: Z92.3

## 2018-08-13 ENCOUNTER — Ambulatory Visit: Payer: Medicare HMO | Admitting: Hematology and Oncology

## 2018-08-13 ENCOUNTER — Other Ambulatory Visit: Payer: Medicare HMO

## 2018-08-27 ENCOUNTER — Ambulatory Visit: Payer: Medicare HMO

## 2018-08-29 ENCOUNTER — Telehealth: Payer: Self-pay | Admitting: Family Medicine

## 2018-08-29 ENCOUNTER — Ambulatory Visit: Payer: Self-pay | Admitting: *Deleted

## 2018-08-29 NOTE — Telephone Encounter (Signed)
Pt reports right sided chest pain, onset 1 month ago. States "Entire right side, my breast and all my ribs on that side." Staes "Mostly constant" Has used [employer] System and Advil which is effective "But comes back eventually." States worse when lying on back at night. Also reports cough "But I've had that for years, I use my breathing machine."  Also reports intermittent lightheadedness, onset yesterday. "I think my blood is low." H/O AAA, COPD. Pt states had mastectomy "Years ago" and maybe the pain is from that."  Call disconnected during triage; unable to continue triage assessment.  Attempted to CB x3, busy. [place] called number again, message reads to enter remote access.  CB#  [phone] Answer Assessment - Initial Assessment Questions 1. LOCATION: "Where does it hurt?"       Right side of chest "All over"  Breast, ribs 2. RADIATION: "Does the pain go anywhere else?" (e.g., into neck, jaw, arms, back)     no 3. ONSET: "When did the chest pain begin?" (Minutes, hours or days)      1 month ago 4. PATTERN "Does the pain come and go, or has it been constant since it started?"  "Does it get worse with exertion?"      Constant, worse with movement, lying on stomach, back 5. DURATION: "How long does it last" (e.g., seconds, minutes, hours)     "Mostly constant" 6. SEVERITY: "How bad is the pain?"  (e.g., Scale 1-10; mild, moderate, or severe)    - MILD (1-3): doesn't interfere with normal activities     - MODERATE (4-7): interferes with normal activities or awakens from sleep    - SEVERE (8-10): excruciating pain, unable to do any normal activities       9/10 7. CARDIAC RISK FACTORS: "Do you have any history of heart problems or risk factors for heart disease?" (e.g., prior heart attack, angina; high blood pressure, diabetes, being overweight, high cholesterol, smoking, or strong family history of heart disease)     AAA 8. PULMONARY RISK FACTORS: "Do you have any history of lung disease?"  (e.g., blood  clots in lung, asthma, emphysema, birth control pills)    COPD 9. CAUSE: "What do you think is causing the chest pain?"     Maybe my mastectomy.  10. OTHER SYMPTOMS: "Do you have any other symptoms?" (e.g., dizziness, nausea, vomiting, sweating, fever, difficulty breathing, cough)      Cough, "Had for years" Dizzy yesterday, "Floaty sometimes"  "I think my bloods low."  Protocols used: CHEST PAIN-A-AH

## 2018-08-29 NOTE — Telephone Encounter (Signed)
I can do a phone visit with her to see if I can convince her top be seen at urgent care for eval. Please call to schedule Friday.

## 2018-08-29 NOTE — Telephone Encounter (Signed)
I spoke with pt; for 1 month and continues with CP (pain level that was a 10 and went outside to sit and pain level went to 0) that comes and goes for 10 ' intervals; pain is sharpe; if moves from waist up like she is going to turn; pt has SOB an prod cough that is yellow phlegm all the time;slight dizziness.no S/T,no muscle pain,no diarrhea,no H/A. No loss of taste or smell. No travel and no known exposure to covid or flu. Pt does not have smart phone and will not go to UC. Pt said she would cb if things don't get better. I advised pt I would send note to Dr Diona Browner to see what she would suggest. Pt said if she uses extra pillow at night she sleeps all night with no pain; I advised pt to use an extra pillow and gave ED precautions which pt voiced understanding.Please advise.

## 2018-08-29 NOTE — Telephone Encounter (Signed)
Best number [phone]  Called pt to change appointment to phone visit.  No virtual capability Pt wanted to come to get a chest xray. For the last 1 month pt has hurt all in her chest and right breast and rib on right side  Rena was on phone with another pt  I transferred pt to [facility] triaged

## 2018-08-29 NOTE — Telephone Encounter (Signed)
Noted... will await triage outcome

## 2018-08-30 ENCOUNTER — Ambulatory Visit: Payer: Medicare HMO | Admitting: Family Medicine

## 2018-08-30 NOTE — Telephone Encounter (Signed)
Given productive cough.. we cannot see her in the office.. please cancel her in office appt. I will not risk our staff and other patients with an exposure to a symptomatic patient. We do not currently have a protocol for seeing respiratory pts.. urgent care does.  If she will let me chat with her on the phone, no video... I can assess whether we need to cover her with antibiotics for possible lung infection. If I am concerned enough we don't need a CXR.  If she refuses, please document that patient understands the risk of not being evaluated for SOB, CP and productive cough.

## 2018-08-30 NOTE — Telephone Encounter (Signed)
Spoke with Mrs. Amy Stone.  She refuses to do a phone visit.  She states if she can't come in the office then she states just forget it.  I have put her on the schedule for 3:20 pm for in office visit.  Let me know if I need to cancel this appointment.

## 2018-08-30 NOTE — Telephone Encounter (Signed)
Mrs. Vigil notified as instructed by telephone.  She still declines phone visit.  She states her symptoms are not bad now.  I did advise that if her symptoms worsen she would need to seek care at an Urgent Care or ER.  Patient states understanding.

## 2018-09-02 ENCOUNTER — Ambulatory Visit: Payer: Medicare HMO

## 2018-09-02 NOTE — Telephone Encounter (Signed)
When we talked with her on Friday she was having respiratory symptoms.  I would probably reschedule her to a later date.

## 2018-09-02 NOTE — Telephone Encounter (Signed)
Dr Diona Browner  Do you want pt to come in 5/19 for cpx she was triaged last week.  I will be leaving today @ 12 please send message to lynn

## 2018-09-02 NOTE — Telephone Encounter (Signed)
Spoke with pt she r/s to 6/18

## 2018-09-03 ENCOUNTER — Encounter: Payer: Medicare HMO | Admitting: Family Medicine

## 2018-09-13 ENCOUNTER — Other Ambulatory Visit: Payer: Self-pay | Admitting: Family Medicine

## 2018-09-24 NOTE — Progress Notes (Signed)
[facility]  [address], [address] [place], [place] [address] Phone: [phone]  Fax: [phone]   Clinic Day:  09/25/2018  Referring physician: Jinny Sanders, MD  Chief Complaint: Amy Stone is a 78 y.o. female with stage IB right breast cancer who is seen for 7 month assessment.  HPI: The patient was last seen in the medical oncology clinic on 02/12/2018. At that time, patient was doing well overall. No B symptoms or recent infections.  She denied any concerns with regards to her breast. Exam was stable. WBC 9300 ([place] 6700).  Hemoglobin 11.7. CA27.29 was normal at 8.4.  She was seen by Dagoberto Ligas, PA-C on 05/15/2018 for routine follow-up of ABF bypass graft. She continued on aspirin and statin daily. Annual follow-up was scheduled.   Bilateral diagnostic mammogram on 05/30/2018 revealed no mammographic evidence of malignancy in either breast. There were stable right breast posttreatment changes.  She called her PCP on 08/29/2018 complaining of right sided breast and rib pain, cough, and lightheadedness. Due to COVID-19 precautions, she was offered a telemedicine visit. She declined and did not follow-up at the Urgent Care.   During the interim, she reports significant pain at a knot in her sternum that radiates across her chest and breasts. It occurs occasionally and has been present for about a year, but worsened in the past 3 months. She reports a spot in her back that is very tender to the touch. She has pain in the base of her neck and shoulder. She performs monthly breast exams.  Occasionally she is nauseous and vomits, but it resolves after a few minutes. She denies any heart problems. She denies any chest pain, or pain that radiates to her arms or neck.   She denies any fevers, sweats, numbness, or tingling. She has shortness of breath when using her inhaler. She denies any blood in her stools or black stools. She is occasionally lightheaded  and dizzy, but denies any falls. Blood pressure was 160/58 in the clinic today. She is unsure when she had her last colonoscopy.   She reports she has stopped eating food because it smells like it is rotten and makes her sick. Symptoms began 2 months ago.  She reports it resolves if she drinks a tablespoon of vinegar mixed with a teaspoon of baking soda before eating. Weight is 104lbs, down 13lbs since 04/2018. She denied Ensure supplemental drinks in the clinic today because she "doesn't like taking medication."  She is adamant that she does not want to undergo surgery again.    Past Medical History:  Diagnosis Date   AAA (abdominal aortic aneurysm) ([place])    Blood transfusion    Breast CA ([place]) 2016   right breast with lumpectomy and rad tx   Breast screening, unspecified    Cancer ([place]) 2015   Bladder   Carcinoma in situ of left breast 05/18/2014   Cervicalgia    Chronic airway obstruction, not elsewhere classified    Coronary atherosclerosis of unspecified type of vessel, native or graft 2007   Epigastric pain    Myocardial infarction [facility]) 2007   Neoplasm of uncertain behavior of skin    Osteoarthrosis, unspecified whether generalized or localized, unspecified site    Osteoporosis, unspecified    Other and unspecified hyperlipidemia    Other chest pain    Peripheral vascular disease ([place])    Personal history of radiation therapy    PONV (postoperative nausea and vomiting)    Shortness  of breath    Thoracic aneurysm, ruptured [facility])    Thyroid nodule    seen on CT Scan 07/19/2011   Unspecified essential hypertension    Unspecified hypothyroidism    Unspecified tinnitus    UTI (lower urinary tract infection)    Wears dentures    full upper and lower    Past Surgical History:  Procedure Laterality Date   ABDOMINAL AORTAGRAM N/A 05/15/2011   Procedure: ABDOMINAL Maxcine Ham;  Surgeon: Angelia Mould, MD;  Location: [facility] CATH LAB;  Service:  Cardiovascular;  Laterality: N/A;   ABDOMINAL HYSTERECTOMY     acute inferior mi  11/2005   AORTA - BILATERAL FEMORAL ARTERY BYPASS GRAFT  07/25/2011   Procedure: AORTA BIFEMORAL BYPASS GRAFT;  Surgeon: Angelia Mould, MD;  Location: [place];  Service: Vascular;  Laterality: Bilateral;   BREAST BIOPSY Right 2016   positive   BREAST LUMPECTOMY Right Feb. 2016   F/U with radiation   CATARACT EXTRACTION W/PHACO Left 09/18/2016   Procedure: CATARACT EXTRACTION PHACO AND INTRAOCULAR LENS PLACEMENT ([place]) Left;  Surgeon: Leandrew Koyanagi, MD;  Location: [place];  Service: Ophthalmology;  Laterality: Left;   CATARACT EXTRACTION W/PHACO Right 11/29/2016   Procedure: CATARACT EXTRACTION PHACO AND INTRAOCULAR LENS PLACEMENT ([place])  Right;  Surgeon: Leandrew Koyanagi, MD;  Location: [place];  Service: Ophthalmology;  Laterality: Right;   CORONARY ARTERY BYPASS GRAFT  11/2005   goiter removal  1979   PORT WINE STAIN REMOVAL W/ LASER  2004   bladder cancer   PR VEIN BYPASS GRAFT,AORTO-FEM-POP  July 25, 2011    Family History  Problem Relation Age of Onset   Alzheimer's disease Father    Parkinsonism Father    Heart defect Mother        enlarged heart   Heart attack Mother    Hypertension Sister    Diabetes Sister    Heart disease Sister    Hyperlipidemia Sister    Breast cancer Sister 65   Hypothyroidism Sister    Diabetes Sister    Aneurysm Paternal Grandfather    Breast cancer Daughter 75    Social History:  reports that she quit smoking about 12 years ago. Her smoking use included cigarettes. She quit after 1.00 year of use. She has never used smokeless tobacco. She reports current alcohol use of about 1.0 standard drinks of alcohol per week. She reports that she does not use drugs. She quit smoking on 11/24/2005.  The patient is alone today.  Allergies:  Allergies  Allergen Reactions   Iohexol      Code: HIVES, Desc: CONTRAST  REACTION OF HIVES (LARGE WHELTS DEVELOPED OVER ENTIRE BODY/MMS    Prednisone Other (See Comments)    hallucinations    Current Medications: Current Outpatient Medications  Medication Sig Dispense Refill   aspirin 81 MG tablet Take 81 mg by mouth daily.     atenolol (TENORMIN) 25 MG tablet Take 1 tablet by mouth once daily 90 tablet 0   atorvastatin (LIPITOR) 80 MG tablet TAKE 1 TABLET BY MOUTH AT BEDTIME 90 tablet 3   letrozole (FEMARA) 2.5 MG tablet TAKE 1 TABLET BY MOUTH ONCE DAILY 90 tablet 3   losartan (COZAAR) 50 MG tablet TAKE 1 TABLET BY MOUTH ONCE DAILY 90 tablet 1   VENTOLIN HFA 108 (90 Base) MCG/ACT inhaler INHALE 2 PUFFS BY MOUTH EVERY 6 HOURS AS NEEDED FOR WHEEZING OR  SHORTNESS  OF  BREATH 18 g 0   levothyroxine (SYNTHROID,  LEVOTHROID) 50 MCG tablet TAKE 1/2 (ONE-HALF) TABLET BY MOUTH ONCE DAILY (Patient not taking: Reported on 09/25/2018) 45 tablet 3   No current facility-administered medications for this visit.     Review of Systems  Constitutional: Positive for weight loss (13 lbs). Negative for chills, diaphoresis, fever and malaise/fatigue.  HENT: Positive for hearing loss (uses hearing aids). Negative for congestion, ear discharge, ear pain, nosebleeds, sinus pain, sore throat and tinnitus.   Eyes: Negative.  Negative for blurred vision, double vision, photophobia and pain.  Respiratory: Negative.  Negative for cough, hemoptysis, sputum production and shortness of breath.   Cardiovascular: Negative.  Negative for chest pain, palpitations, orthopnea, leg swelling and PND.  Gastrointestinal: Positive for constipation, nausea and vomiting. Negative for abdominal pain, blood in stool, diarrhea, heartburn and melena.       Loss of appetite. Loss of taste. Altered smell.  Genitourinary: Negative.  Negative for dysuria, frequency, hematuria and urgency.  Musculoskeletal: Positive for neck pain (sorenees at base of neck, shoulders). Negative for back pain, falls, joint  pain and myalgias.  Skin: Negative.  Negative for itching and rash.  Neurological: Negative.  Negative for dizziness, tremors, sensory change, speech change, focal weakness, weakness and headaches.  Endo/Heme/Allergies: Negative.  Does not bruise/bleed easily.  Psychiatric/Behavioral: Negative.  Negative for depression and memory loss. The patient is not nervous/anxious and does not have insomnia.   All other systems reviewed and are negative.  Performance status (ECOG): 1  BP (!) 161/50 (BP Location: Left Arm, Patient Position: Sitting)    Pulse 60    Temp (!) 97.4 F (36.3 C) (Tympanic)    Resp 18    Ht _0  (1.651 m)    Wt 104 lb 6.2 oz (47.3 kg)    SpO2 96%    BMI 17.37 kg/m    Physical Exam  Constitutional: She is oriented to person, place, and time. She appears well-developed and well-nourished. No distress.  Thin appearing elderly female sitting comfortably in the exam room in no acute distress.   HENT:  Head: Normocephalic and atraumatic.  Mouth/Throat: Oropharynx is clear and moist. No oropharyngeal exudate.  Short gray hair.  Dentures. Mask.  Eyes: Pupils are equal, round, and reactive to light. Conjunctivae and EOM are normal. No scleral icterus.  Glasses.  Light hazel eyes.   Neck: Normal range of motion. Neck supple.  Cardiovascular: Regular rhythm and normal heart sounds.  No murmur heard. Pulmonary/Chest: Effort normal and breath sounds normal. No respiratory distress. She has no wheezes. She exhibits tenderness and bony tenderness. Right breast exhibits no inverted nipple, no mass, no nipple discharge and no skin change (well healed incision; scattered mildly tender fibrocystic changes). Left breast exhibits no inverted nipple, no mass, no nipple discharge, no skin change (tender fibrocystic changes superiorly) and no tenderness.  Abdominal: Soft. Bowel sounds are normal. She exhibits no distension. There is no abdominal tenderness.  Musculoskeletal: Normal range of  motion.        General: No edema.     Left hip: She exhibits tenderness (left iliac).     Thoracic back: She exhibits tenderness and bony tenderness (to the right of her spine).  Lymphadenopathy:    She has no cervical adenopathy.  Neurological: She is alert and oriented to person, place, and time.  Skin: Skin is warm and dry. She is not diaphoretic. No erythema.  Psychiatric: She has a normal mood and affect. Judgment and thought content normal.  Nursing note and vitals reviewed.  Appointment on 09/25/2018  Component Date Value Ref Range Status   Sodium 09/25/2018 132* 135 - 145 mmol/L Final   Potassium 09/25/2018 4.0  3.5 - 5.1 mmol/L Final   Chloride 09/25/2018 97* 98 - 111 mmol/L Final   CO2 09/25/2018 26  22 - 32 mmol/L Final   Glucose, Bld 09/25/2018 119* 70 - 99 mg/dL Final   BUN 09/25/2018 13  8 - 23 mg/dL Final   Creatinine, Ser 09/25/2018 0.92  0.44 - 1.00 mg/dL Final   Calcium 09/25/2018 9.6  8.9 - 10.3 mg/dL Final   Total Protein 09/25/2018 8.0  6.5 - 8.1 g/dL Final   Albumin 09/25/2018 3.7  3.5 - 5.0 g/dL Final   AST 09/25/2018 17  15 - 41 U/L Final   ALT 09/25/2018 15  0 - 44 U/L Final   Alkaline Phosphatase 09/25/2018 79  38 - 126 U/L Final   Total Bilirubin 09/25/2018 0.7  0.3 - 1.2 mg/dL Final   GFR calc non Af Amer 09/25/2018 60* >60 mL/min Final   GFR calc Af Amer 09/25/2018 >60  >60 mL/min Final   Anion gap 09/25/2018 9  5 - 15 Final   Performed at [facility], [address], [place], [place] [address]   WBC 09/25/2018 15.1* 4.0 - 10.5 K/uL Final   RBC 09/25/2018 3.97  3.87 - 5.11 MIL/uL Final   Hemoglobin 09/25/2018 11.4* 12.0 - 15.0 g/dL Final   HCT 09/25/2018 34.9* 36.0 - 46.0 % Final   MCV 09/25/2018 87.9  80.0 - 100.0 fL Final   MCH 09/25/2018 28.7  26.0 - 34.0 pg Final   MCHC 09/25/2018 32.7  30.0 - 36.0 g/dL Final   RDW 09/25/2018 13.9  11.5 - 15.5 % Final   Platelets 09/25/2018 377  150 - 400 K/uL Final    nRBC 09/25/2018 0.0  0.0 - 0.2 % Final   Neutrophils Relative % 09/25/2018 75  % Final   Neutro Abs 09/25/2018 11.5* 1.7 - 7.7 K/uL Final   Lymphocytes Relative 09/25/2018 14  % Final   Lymphs Abs 09/25/2018 2.1  0.7 - 4.0 K/uL Final   Monocytes Relative 09/25/2018 8  % Final   Monocytes Absolute 09/25/2018 1.2* 0.1 - 1.0 K/uL Final   Eosinophils Relative 09/25/2018 1  % Final   Eosinophils Absolute 09/25/2018 0.2  0.0 - 0.5 K/uL Final   Basophils Relative 09/25/2018 1  % Final   Basophils Absolute 09/25/2018 0.1  0.0 - 0.1 K/uL Final   Immature Granulocytes 09/25/2018 1  % Final   Abs Immature Granulocytes 09/25/2018 0.07  0.00 - 0.07 K/uL Final   Performed at [facility], [address], [place], [place] [address]    Assessment:  Amy Stone is a 78 y.o. female with stage IB right breast cancer s/p partial mastectomy on 06/02/2014.  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  Tumor was ER positive (> 90%), PR positive (1-10%), and Her2/neu negative by FISH.  Two sentinel lymph nodes were negative.  Pathologic stage was [id number].  She received 4256 cGy to the right breast from 07/01/2014 - 07/22/2014 and 1440 cGy to the scar from 07/23/2014 - 08/05/2014.    She began Femara after completing radiation.   Bilateral diagnostic mammogram on 05/30/2018 revealed no mammographic evidence of malignancy in either breast. There were stable right breast posttreatment changes.  CA27.29 has been followed: 8.5 on 03/24/2016, 6.9 on 08/07/2016, 8.4 on 02/06/2017, 11.9 on 08/07/2017, 8.4  on 02/12/2018, and 28.4 on 09/25/2018.  Bone density study on 10/08/2014 revealed osteoporosis with a T score of -3.2 in the right femur and -2.4 in L1-L4.  She was briefly on weekly Fosamax.  She is not interested in any further treatment.  She has a history of non-invasive bladder cancer.  She undergoes yearly cystoscopy (next appointment  11/2015).  Symptomatically, she has lost 13 pounds since 04/2018.  She describes nausea secondary to food smelling rotten.  She has scattered bone pain.  Plan: 1.   Labs today:  CBC with diff, CMP, CA27.29. 2.   Stage IB right breast cancer Patient on letrozole. Unclear if constellation of symptoms related to breast cancer. Mammogram on 05/30/2018 revealed no evidence of disease. 3.   Bone pain  Pain in multiple bones on palpation associated with weight loss.   Pain in sternum, posterior right ribs, left iliac crest.   Alkaline phosphatase, however, is not elevated.  CXR (PA and lateral) today.  Schedule bone scan. 4.   Weight loss  Imaging as above.  Add TSH. 5.   Osteoporosis Continue calcium and vitamin D. Patient declines Prolia or a bisphosphonate. 6.   RTC after imaging for MD assessment, review of imaging studies, and discussion regarding direction of therapy.  I discussed the assessment and treatment plan with the patient.  The patient was provided an opportunity to ask questions and all were answered.  The patient agreed with the plan and demonstrated an understanding of the instructions.  The patient was advised to call back if the symptoms worsen or if the condition fails to improve as anticipated.  I provided 30 minutes of face-to-face time during this this encounter and > 50% was spent counseling as documented under my assessment and plan.    Lequita Asal, MD, PhD    09/25/2018, 2:34 PM  I, Molly Dorshimer, am acting as Education administrator for [employer]. Mike Gip, MD, PhD.  I, Lexee Brashears C. Mike Gip, MD, have reviewed the above documentation for accuracy and completeness, and I agree with the above.

## 2018-09-25 ENCOUNTER — Inpatient Hospital Stay: Payer: Medicare HMO | Attending: Hematology and Oncology | Admitting: Hematology and Oncology

## 2018-09-25 ENCOUNTER — Inpatient Hospital Stay: Payer: Medicare HMO | Attending: Hematology and Oncology

## 2018-09-25 ENCOUNTER — Other Ambulatory Visit: Payer: Self-pay

## 2018-09-25 ENCOUNTER — Encounter: Payer: Self-pay | Admitting: Hematology and Oncology

## 2018-09-25 VITALS — BP 161/50 | HR 60 | Temp 97.4°F | Resp 18 | Ht 65.0 in | Wt 104.4 lb

## 2018-09-25 DIAGNOSIS — E039 Hypothyroidism, unspecified: Secondary | ICD-10-CM | POA: Insufficient documentation

## 2018-09-25 DIAGNOSIS — Z170 Estrogen receptor positive status [ER+]: Secondary | ICD-10-CM

## 2018-09-25 DIAGNOSIS — Z803 Family history of malignant neoplasm of breast: Secondary | ICD-10-CM | POA: Insufficient documentation

## 2018-09-25 DIAGNOSIS — R634 Abnormal weight loss: Secondary | ICD-10-CM

## 2018-09-25 DIAGNOSIS — M810 Age-related osteoporosis without current pathological fracture: Secondary | ICD-10-CM | POA: Insufficient documentation

## 2018-09-25 DIAGNOSIS — J449 Chronic obstructive pulmonary disease, unspecified: Secondary | ICD-10-CM | POA: Diagnosis not present

## 2018-09-25 DIAGNOSIS — I252 Old myocardial infarction: Secondary | ICD-10-CM | POA: Diagnosis not present

## 2018-09-25 DIAGNOSIS — Z7982 Long term (current) use of aspirin: Secondary | ICD-10-CM

## 2018-09-25 DIAGNOSIS — Z79811 Long term (current) use of aromatase inhibitors: Secondary | ICD-10-CM | POA: Insufficient documentation

## 2018-09-25 DIAGNOSIS — Z87891 Personal history of nicotine dependence: Secondary | ICD-10-CM | POA: Insufficient documentation

## 2018-09-25 DIAGNOSIS — I10 Essential (primary) hypertension: Secondary | ICD-10-CM | POA: Diagnosis not present

## 2018-09-25 DIAGNOSIS — R0781 Pleurodynia: Secondary | ICD-10-CM

## 2018-09-25 DIAGNOSIS — C50911 Malignant neoplasm of unspecified site of right female breast: Secondary | ICD-10-CM

## 2018-09-25 DIAGNOSIS — Z79899 Other long term (current) drug therapy: Secondary | ICD-10-CM | POA: Diagnosis not present

## 2018-09-25 DIAGNOSIS — E785 Hyperlipidemia, unspecified: Secondary | ICD-10-CM | POA: Insufficient documentation

## 2018-09-25 DIAGNOSIS — M898X9 Other specified disorders of bone, unspecified site: Secondary | ICD-10-CM

## 2018-09-25 LAB — CBC WITH DIFFERENTIAL/PLATELET
Abs Immature Granulocytes: 0.07 10*3/uL (ref 0.00–0.07)
Basophils Absolute: 0.1 10*3/uL (ref 0.0–0.1)
Basophils Relative: 1 %
Eosinophils Absolute: 0.2 10*3/uL (ref 0.0–0.5)
Eosinophils Relative: 1 %
HCT: 34.9 % — ABNORMAL LOW (ref 36.0–46.0)
Hemoglobin: 11.4 g/dL — ABNORMAL LOW (ref 12.0–15.0)
Immature Granulocytes: 1 %
Lymphocytes Relative: 14 %
Lymphs Abs: 2.1 10*3/uL (ref 0.7–4.0)
MCH: 28.7 pg (ref 26.0–34.0)
MCHC: 32.7 g/dL (ref 30.0–36.0)
MCV: 87.9 fL (ref 80.0–100.0)
Monocytes Absolute: 1.2 10*3/uL — ABNORMAL HIGH (ref 0.1–1.0)
Monocytes Relative: 8 %
Neutro Abs: 11.5 10*3/uL — ABNORMAL HIGH (ref 1.7–7.7)
Neutrophils Relative %: 75 %
Platelets: 377 10*3/uL (ref 150–400)
RBC: 3.97 MIL/uL (ref 3.87–5.11)
RDW: 13.9 % (ref 11.5–15.5)
WBC: 15.1 10*3/uL — ABNORMAL HIGH (ref 4.0–10.5)
nRBC: 0 % (ref 0.0–0.2)

## 2018-09-25 LAB — COMPREHENSIVE METABOLIC PANEL
ALT: 15 U/L (ref 0–44)
AST: 17 U/L (ref 15–41)
Albumin: 3.7 g/dL (ref 3.5–5.0)
Alkaline Phosphatase: 79 U/L (ref 38–126)
Anion gap: 9 (ref 5–15)
BUN: 13 mg/dL (ref 8–23)
CO2: 26 mmol/L (ref 22–32)
Calcium: 9.6 mg/dL (ref 8.9–10.3)
Chloride: 97 mmol/L — ABNORMAL LOW (ref 98–111)
Creatinine, Ser: 0.92 mg/dL (ref 0.44–1.00)
GFR calc Af Amer: >60 mL/min (ref >60)
GFR calc non Af Amer: 60 mL/min — ABNORMAL LOW (ref >60)
Glucose, Bld: 119 mg/dL — ABNORMAL HIGH (ref 70–99)
Potassium: 4 mmol/L (ref 3.5–5.1)
Sodium: 132 mmol/L — ABNORMAL LOW (ref 135–145)
Total Bilirubin: 0.7 mg/dL (ref 0.3–1.2)
Total Protein: 8 g/dL (ref 6.5–8.1)

## 2018-09-25 LAB — TSH: TSH: 0.321 u[IU]/mL — ABNORMAL LOW (ref 0.350–4.500)

## 2018-09-25 NOTE — Progress Notes (Signed)
Patient c/o pain noted from waist up / Pain level 5 , no SOB or dizziness.I did offer the patient some ensure and boost. The patient refused

## 2018-09-26 LAB — CANCER ANTIGEN 27.29: CA 27.29: 28.4 U/mL (ref 0.0–38.6)

## 2018-10-03 ENCOUNTER — Encounter: Payer: Self-pay | Admitting: Family Medicine

## 2018-10-03 ENCOUNTER — Ambulatory Visit (INDEPENDENT_AMBULATORY_CARE_PROVIDER_SITE_OTHER): Payer: Medicare HMO | Admitting: Family Medicine

## 2018-10-03 ENCOUNTER — Ambulatory Visit (INDEPENDENT_AMBULATORY_CARE_PROVIDER_SITE_OTHER)
Admission: RE | Admit: 2018-10-03 | Discharge: 2018-10-03 | Disposition: A | Discharge status: Home / Self Care | Payer: Medicare HMO | Source: Ambulatory Visit | Attending: Family Medicine | Admitting: Family Medicine

## 2018-10-03 ENCOUNTER — Other Ambulatory Visit: Payer: Self-pay

## 2018-10-03 VITALS — BP 96/50 | HR 60 | Temp 97.7°F | Ht 64.5 in | Wt 105.2 lb

## 2018-10-03 DIAGNOSIS — R053 Chronic cough: Secondary | ICD-10-CM

## 2018-10-03 DIAGNOSIS — R05 Cough: Secondary | ICD-10-CM

## 2018-10-03 DIAGNOSIS — R64 Cachexia: Secondary | ICD-10-CM | POA: Diagnosis not present

## 2018-10-03 DIAGNOSIS — R1013 Epigastric pain: Secondary | ICD-10-CM | POA: Diagnosis not present

## 2018-10-03 DIAGNOSIS — I10 Essential (primary) hypertension: Secondary | ICD-10-CM

## 2018-10-03 DIAGNOSIS — J449 Chronic obstructive pulmonary disease, unspecified: Secondary | ICD-10-CM | POA: Diagnosis not present

## 2018-10-03 DIAGNOSIS — E038 Other specified hypothyroidism: Secondary | ICD-10-CM | POA: Diagnosis not present

## 2018-10-03 DIAGNOSIS — E782 Mixed hyperlipidemia: Secondary | ICD-10-CM | POA: Diagnosis not present

## 2018-10-03 DIAGNOSIS — Z0000 Encounter for general adult medical examination without abnormal findings: Secondary | ICD-10-CM

## 2018-10-03 LAB — LIPID PANEL
Cholesterol: 97 mg/dL (ref 0–200)
HDL: 31.7 mg/dL — ABNORMAL LOW (ref >39.00)
LDL Cholesterol: 44 mg/dL (ref 0–99)
NonHDL: 64.88
Total CHOL/HDL Ratio: 3
Triglycerides: 103 mg/dL (ref 0.0–149.0)
VLDL: 20.6 mg/dL (ref 0.0–40.0)

## 2018-10-03 LAB — LIPASE: Lipase: 11 U/L (ref 11.0–59.0)

## 2018-10-03 MED ORDER — ATENOLOL 25 MG PO TABS: 25.0000 mg | ORAL_TABLET | Freq: Every day | ORAL | 0 refills | Status: AC

## 2018-10-03 MED ORDER — LEVOTHYROXINE SODIUM 50 MCG PO TABS: ORAL_TABLET | ORAL | 3 refills | Status: AC

## 2018-10-03 MED ORDER — ATORVASTATIN CALCIUM 80 MG PO TABS: 80.0000 mg | ORAL_TABLET | Freq: Every day | ORAL | 3 refills | Status: AC

## 2018-10-03 MED ORDER — LOSARTAN POTASSIUM 50 MG PO TABS: 25.0000 mg | ORAL_TABLET | Freq: Every day | ORAL | 1 refills | Status: AC

## 2018-10-03 NOTE — Progress Notes (Signed)
Chief Complaint  Patient presents with  . Medicare Wellness    History of Present Illness: HPI   The patient presents for annual medicare wellness, complete physical and review of chronic health problems. He/She also has the following acute concerns today: persistent cough, abnormal weight loss  I have personally reviewed the Medicare Annual Wellness questionnaire and have noted 1. The patient's medical and social history 2. Their use of alcohol, tobacco or illicit drugs 3. Their current medications and supplements 4. The patient's functional ability including ADL's, fall risks, home safety risks and hearing or visual             impairment. 5. Diet and physical activities 6. Evidence for depression or mood disorders 7.         Updated provider list Cognitive evaluation was performed and recorded on pt medicare questionnaire form. The patients weight, height, BMI and visual acuity have been recorded in the chart  I have made referrals, counseling and provided education to the patient based review of the above and I have provided the pt with a written personalized care plan for preventive services.   Documentation of this information was scanned into the electronic record under the media tab.   Advance directives and end of life planning reviewed in detail with patient and documented in EMR. Patient given handout on advance care directives if needed. HCPOA and living will updated if needed.   Hearing Screening   125Hz  250Hz  500Hz  1000Hz  2000Hz  3000Hz  4000Hz  6000Hz  8000Hz   Right ear:           Left ear:           Comments: Wears Bilateral Hearing Aides  Vision Screening Comments: Wears Glasses-Eye Exam 04/2018  Fall Risk  10/03/2018 11/14/2017 08/15/2017 05/09/2016 05/04/2015  Falls in the past year? 0 No No Yes No  Comment - - - pt had two accidental falls during winter storm while cleaning ice off porch -  Number falls in past yr: - - - 2 or more -  Injury with Fall? - - - No -    Depression screen [facility] 2/9 10/03/2018 11/14/2017 08/15/2017 05/09/2016 05/04/2015  Decreased Interest 0 0 0 0 0  Down, Depressed, Hopeless 0 0 0 0 0  PHQ - 2 Score 0 0 0 0 0  Altered sleeping - - 0 - -  Tired, decreased energy - - 0 - -  Change in appetite - - 0 - -  Feeling bad or failure about yourself  - - 0 - -  Trouble concentrating - - 0 - -  Moving slowly or fidgety/restless - - 0 - -  Suicidal thoughts - - 0 - -  PHQ-9 Score - - 0 - -  Difficult doing work/chores - - Not difficult at all - -     She has not been feeling well since March.. pain in sternum and bilateral breasts, off and on.  Nml mammogram in 05/2018.  Pain upper abdomen. Occurring 5-10 min ( worse after any food, has emesis) Has gallbladder.he is not eating as much. She has lost 10 lbs in last 6 months.. possibly 30 in last year. No chest pain with exertion, stable SOB.  She also has a constant cough for the last year, not worse lately. White mucus productive.  Ache in upper chest at times.. no exertionally related.   She is former smoker  Has not left house since pandemic.  Hypertension:  Good control on atenolol and lotensin. BP Readings  from Last 3 Encounters:  10/03/18 (!) 96/50  09/25/18 (!) 161/50  05/15/18 (!) 147/71  Using medication without problems or lightheadedness:   Yes, today and off and on Chest pain with exertion:none Edema:none Short of breath: stable Average home BPs: Other issues:  Labs unremarkable  Elevated Cholesterol:High risk, on high dose statin lipitor 80 mg daily, good control. Using medications without problems: Muscle aches:  Diet compliance: moderate Exercise:none Other complaints:  Hypothyroid:  Stable  On levothyroxine  COPD, moderate: well controlled per pt.. Could not afford spiriva or adavir.. Using albuterol as needed off and on.  Breast cancer right breast stage 1b followed by ONC. Dr. Felicita Gage S/P surgical resection/ partial mastectomy and  radiation..  Status post aortic bifemoral bypass graft in April 2059for AAA andsignificant aortoiliac occlusive disease. She has no claudication symptomswith walking, nosigns of ischemia in her feet/legs, no back or abdominal pain. Followed by vascular.   COVID 19 screen No recent travel or known exposure to COVID19 The patient denies respiratory symptoms of COVID 19 at this time.  The importance of social distancing was discussed today.   Review of Systems  Constitutional: Negative for chills and fever.  HENT: Negative for congestion and ear pain.   Eyes: Negative for pain and redness.  Respiratory: Positive for cough, sputum production and shortness of breath.   Cardiovascular: Negative for chest pain, palpitations and leg swelling.  Gastrointestinal: Negative for abdominal pain, blood in stool, constipation, diarrhea, nausea and vomiting.  Genitourinary: Negative for dysuria.  Musculoskeletal: Negative for falls and myalgias.  Skin: Negative for rash.  Neurological: Negative for dizziness.  Psychiatric/Behavioral: Negative for depression. The patient is not nervous/anxious.       Past Medical History:  Diagnosis Date  . AAA (abdominal aortic aneurysm) ([place])   . Blood transfusion   . Breast CA ([place]) 2016   right breast with lumpectomy and rad tx  . Breast screening, unspecified   . Cancer [facility]) 2015   Bladder  . 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 Cervicalgia   . Chronic airway obstruction, not elsewhere classified   . Coronary atherosclerosis of unspecified type of vessel, native or graft 2007  . Epigastric pain   . Myocardial infarction ([place]) 2007  . Neoplasm of uncertain behavior of skin   . Osteoarthrosis, unspecified whether generalized or localized, unspecified site   . Osteoporosis, unspecified   . Other and unspecified hyperlipidemia   . Other chest pain   . Peripheral vascular disease ([place])   . Personal history of radiation therapy   . PONV  (postoperative nausea and vomiting)   . Shortness of breath   . Thoracic aneurysm, ruptured ([place])   . Thyroid nodule    seen on CT Scan 07/19/2011  . Unspecified essential hypertension   . Unspecified hypothyroidism   . Unspecified tinnitus   . UTI (lower urinary tract infection)   . Wears dentures    full upper and lower    reports that she quit smoking about 12 years ago. Her smoking use included cigarettes. She quit after 1.00 year of use. She has never used smokeless tobacco. She reports current alcohol use of about 1.0 standard drinks of alcohol per week. She reports that she does not use drugs.   Current Outpatient Medications:  .  aspirin 81 MG tablet, Take 81 mg by mouth daily., Disp: , Rfl:  .  atenolol (TENORMIN) 25 MG tablet, Take 1 tablet by mouth once daily, Disp: 90 tablet, Rfl:  0 .  atorvastatin (LIPITOR) 80 MG tablet, TAKE 1 TABLET BY MOUTH AT BEDTIME, Disp: 90 tablet, Rfl: 3 .  letrozole (FEMARA) 2.5 MG tablet, TAKE 1 TABLET BY MOUTH ONCE DAILY, Disp: 90 tablet, Rfl: 3 .  levothyroxine (SYNTHROID, LEVOTHROID) 50 MCG tablet, TAKE 1/2 (ONE-HALF) TABLET BY MOUTH ONCE DAILY, Disp: 45 tablet, Rfl: 3 .  losartan (COZAAR) 50 MG tablet, TAKE 1 TABLET BY MOUTH ONCE DAILY, Disp: 90 tablet, Rfl: 1 .  VENTOLIN HFA 108 (90 Base) MCG/ACT inhaler, INHALE 2 PUFFS BY MOUTH EVERY 6 HOURS AS NEEDED FOR WHEEZING OR  SHORTNESS  OF  BREATH, Disp: 18 g, Rfl: 0   Observations/Objective: Blood pressure (!) 96/50, pulse 60, temperature 97.7 F (36.5 C), temperature source Oral, height 5' 4.5" (1.638 m), weight 105 lb 4 oz (47.7 kg), SpO2 92 %.  She has lost 30 lBS in last year!  Wt Readings from Last 3 Encounters:  10/03/18 105 lb 4 oz (47.7 kg)  09/25/18 104 lb 6.2 oz (47.3 kg)  05/15/18 117 lb (53.1 kg)    Physical Exam Constitutional:      General: She is not in acute distress.    Appearance: Normal appearance. She is well-developed. She is cachectic. She is ill-appearing. She is not  toxic-appearing.  HENT:     Head: Normocephalic.     Right Ear: Hearing, tympanic membrane, ear canal and external ear normal.     Left Ear: Hearing, tympanic membrane, ear canal and external ear normal.     Nose: Nose normal.  Eyes:     General: Lids are normal. Lids are everted, no foreign bodies appreciated.     Conjunctiva/sclera: Conjunctivae normal.     Pupils: Pupils are equal, round, and reactive to light.  Neck:     Musculoskeletal: Normal range of motion and neck supple.     Thyroid: No thyroid mass or thyromegaly.     Vascular: No carotid bruit.     Trachea: Trachea normal.  Cardiovascular:     Rate and Rhythm: Normal rate and regular rhythm.     Heart sounds: Normal heart sounds, S1 normal and S2 normal. No murmur. No gallop.   Pulmonary:     Effort: Pulmonary effort is normal. No respiratory distress.     Breath sounds: Rhonchi present. No wheezing or rales.  Abdominal:     General: Bowel sounds are normal. There is no distension or abdominal bruit.     Palpations: Abdomen is soft. There is no fluid wave or mass.     Tenderness: There is no abdominal tenderness. There is no guarding or rebound.     Hernia: No hernia is present.  Lymphadenopathy:     Cervical: No cervical adenopathy.  Skin:    General: Skin is warm and dry.     Findings: No rash.  Neurological:     Mental Status: She is alert.     Cranial Nerves: No cranial nerve deficit.     Sensory: No sensory deficit.  Psychiatric:        Mood and Affect: Mood is not anxious or depressed.        Speech: Speech normal.        Behavior: Behavior normal. Behavior is cooperative.        Judgment: Judgment normal.      Assessment and Plan   The patient's preventative maintenance and recommended screening tests for an annual wellness exam were reviewed in full today. Brought up to date unless services  declined.  Counselled on the importance of diet, exercise, and its role in overall health and mortality. The  patient's FH and SH was reviewed, including their home life, tobacco status, and drug and alcohol status.   Former smoker Quit11 years ago, 15 Years but 1/2 pack per day: 25 pack year history. Not interested in lung cancer screening program. Vaccines:Uptodate. Mammogram: Right ductal breast cancer,February of 2016.. 05/2018 negative. status post lumpectomy and sentinel lymph node evaluation (June 02, 2014) followoed by onc. Colon: 2009 Dr. Kristopher Glee, polyp,  Not interested in repeating at this time DEXA: 2016 Osteoporosis -3.2 hip on Femara currently. Stopped fosamax in past given cost, repeat due but she refuses eval or treatment at this time. " I a,m tired of medication" Discussed in detail with onc at last OV.  DVE/PAP: not indicated, TAH for non-cancer reasons.  Fall risk: reviewed home safety and fall prevention.   Eliezer Lofts, MD

## 2018-10-03 NOTE — Patient Instructions (Addendum)
Decreased losartan to 1/2 tablet daily... given lower blood pressures.  We will call with CXR results.    We will call you with pancreas test results and consider [place] of gallbaldder .  Start prilosec 20 mg daily.

## 2018-10-04 ENCOUNTER — Telehealth: Payer: Self-pay

## 2018-10-04 ENCOUNTER — Telehealth: Payer: Self-pay | Admitting: Family Medicine

## 2018-10-04 ENCOUNTER — Encounter
Admission: RE | Admit: 2018-10-04 | Discharge: 2018-10-04 | Disposition: A | Discharge status: Home / Self Care | Payer: Medicare HMO | Source: Ambulatory Visit | Attending: Hematology and Oncology | Admitting: Hematology and Oncology

## 2018-10-04 DIAGNOSIS — M898X9 Other specified disorders of bone, unspecified site: Secondary | ICD-10-CM | POA: Insufficient documentation

## 2018-10-04 DIAGNOSIS — R911 Solitary pulmonary nodule: Secondary | ICD-10-CM

## 2018-10-04 DIAGNOSIS — Z853 Personal history of malignant neoplasm of breast: Secondary | ICD-10-CM | POA: Diagnosis not present

## 2018-10-04 MED ORDER — TECHNETIUM TC 99M MEDRONATE IV KIT
21.6400 | PACK | Freq: Once | INTRAVENOUS | Status: AC | PRN
  Administered 2018-10-04: 21.64 via INTRAVENOUS

## 2018-10-04 NOTE — Telephone Encounter (Signed)
Olivia Mackie at [facility] called report on CXR; report taken to Dr Diona Browner and is in epic also.FYI to Dr Diona Browner.

## 2018-10-04 NOTE — Telephone Encounter (Signed)
-----   Message from Weinand Kitten, [place] sent at 10/04/2018  4:55 PM EDT ----- Mrs. Eulas Post notified as instructed by telephone.  She is agreeable to chest CT.

## 2018-10-04 NOTE — Telephone Encounter (Signed)
-----   Message from Orosz Kitten, [place] sent at 10/04/2018  4:55 PM EDT ----- Mrs. Eulas Post notified as instructed by telephone.  She is agreeable to chest CT.

## 2018-10-08 NOTE — Progress Notes (Signed)
[facility]  [address], [address] [place], [place] [address] Phone: [phone]  Fax: [phone]   Clinic Day:  10/08/2018  Referring physician: Jinny Sanders, MD  Chief Complaint: Amy Stone is a 79 y.o. female with stage IB right breast cancer  who is seen for 2 week assessment.  HPI: The patient was last seen in the medical oncology clinic on 09/25/2018. At that time, she had lost 13 pounds since 04/2018.  She described nausea secondary to food smelling rotten.  She had scattered bone pain.  CA27.29 was 28.4.  TSH was 0.321 (0.35-4.5).  CXR and bone scan were ordered.  Bone scan on 10/04/2018 was negative.  She has not undergone the CXR.  Chest CT was ordered by Dr Diona Browner (imaging on 10/10/2018).    During the interim, the patient feels "fairly well." She notes chest pain, coughing, and feeling cold. When she coughs there is a lot of phlegm. She denies any nausea, vomiting, and diarrhea. She denies any new symptoms.  Her weight is down 2 lbs. She is eating more; food no longer smells rotten. She is not interested in any kind of operations, or talking with a nutritionist.    She notes "sometimes I don't care if my whole body just disappears, because of this COVID-19." She is does not like being inside during COVID-19. She stays active in the garden, and with her grandchildren.    Past Medical History:  Diagnosis Date  . AAA (abdominal aortic aneurysm) ([place])   . Blood transfusion   . Breast CA ([place]) 2016   right breast with lumpectomy and rad tx  . Breast screening, unspecified   . Cancer [facility]) 2015   Bladder  . 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 Cervicalgia   . Chronic airway obstruction, not elsewhere classified   . Coronary atherosclerosis of unspecified type of vessel, native or graft 2007  . Epigastric pain   . Myocardial infarction ([place]) 2007  . Neoplasm of uncertain behavior of skin   . Osteoarthrosis, unspecified  whether generalized or localized, unspecified site   . Osteoporosis, unspecified   . Other and unspecified hyperlipidemia   . Other chest pain   . Peripheral vascular disease ([place])   . Personal history of radiation therapy   . PONV (postoperative nausea and vomiting)   . Shortness of breath   . Thoracic aneurysm, ruptured ([place])   . Thyroid nodule    seen on CT Scan 07/19/2011  . Unspecified essential hypertension   . Unspecified hypothyroidism   . Unspecified tinnitus   . UTI (lower urinary tract infection)   . Wears dentures    full upper and lower    Past Surgical History:  Procedure Laterality Date  . ABDOMINAL AORTAGRAM N/A 05/15/2011   Procedure: ABDOMINAL Maxcine Ham;  Surgeon: Angelia Mould, MD;  Location: [facility] CATH LAB;  Service: Cardiovascular;  Laterality: N/A;  . ABDOMINAL HYSTERECTOMY    . acute inferior mi  11/2005  . AORTA - BILATERAL FEMORAL ARTERY BYPASS GRAFT  07/25/2011   Procedure: AORTA BIFEMORAL BYPASS GRAFT;  Surgeon: Angelia Mould, MD;  Location: [place];  Service: Vascular;  Laterality: Bilateral;  . BREAST BIOPSY Right 2016   positive  . BREAST LUMPECTOMY Right Feb. 2016   F/U with radiation  . CATARACT EXTRACTION W/PHACO Left 09/18/2016   Procedure: CATARACT EXTRACTION PHACO AND INTRAOCULAR LENS PLACEMENT ([place]) Left;  Surgeon: Leandrew Koyanagi, MD;  Location: [place];  Service: Ophthalmology;  Laterality: Left;  . CATARACT EXTRACTION W/PHACO Right 11/29/2016   Procedure: CATARACT EXTRACTION PHACO AND INTRAOCULAR LENS PLACEMENT ([place])  Right;  Surgeon: Leandrew Koyanagi, MD;  Location: [place];  Service: Ophthalmology;  Laterality: Right;  . CORONARY ARTERY BYPASS GRAFT  11/2005  . goiter removal  1979  . PORT WINE STAIN REMOVAL W/ LASER  2004   bladder cancer  . PR VEIN BYPASS GRAFT,AORTO-FEM-POP  July 25, 2011    Family History  Problem Relation Age of Onset  . Alzheimer's disease Father   . Parkinsonism Father   .  Heart defect Mother        enlarged heart  . Heart attack Mother   . Hypertension Sister   . Diabetes Sister   . Heart disease Sister   . Hyperlipidemia Sister   . Breast cancer Sister 56  . Hypothyroidism Sister   . Diabetes Sister   . Aneurysm Paternal Grandfather   . Breast cancer Daughter 48    Social History:  reports that she quit smoking about 12 years ago. Her smoking use included cigarettes. She quit after 1.00 year of use. She has never used smokeless tobacco. She reports current alcohol use of about 1.0 standard drinks of alcohol per week. She reports that she does not use drugs. She quit smoking on 11/24/2005.  The patient is alone today.  Allergies:  Allergies  Allergen Reactions  . Iohexol      Code: HIVES, Desc: CONTRAST REACTION OF HIVES (LARGE WHELTS DEVELOPED OVER ENTIRE BODY/MMS   . Prednisone Other (See Comments)    hallucinations    Current Medications: Current Outpatient Medications  Medication Sig Dispense Refill  . aspirin 81 MG tablet Take 81 mg by mouth daily.    [name] atenolol (TENORMIN) 25 MG tablet Take 1 tablet (25 mg total) by mouth daily. 90 tablet 0  . atorvastatin (LIPITOR) 80 MG tablet Take 1 tablet (80 mg total) by mouth at bedtime. 90 tablet 3  . letrozole (FEMARA) 2.5 MG tablet TAKE 1 TABLET BY MOUTH ONCE DAILY 90 tablet 3  . levothyroxine (SYNTHROID) 50 MCG tablet TAKE 1/2 (ONE-HALF) TABLET BY MOUTH ONCE DAILY 45 tablet 3  . losartan (COZAAR) 50 MG tablet Take 0.5 tablets (25 mg total) by mouth daily. 45 tablet 1  . VENTOLIN HFA 108 (90 Base) MCG/ACT inhaler INHALE 2 PUFFS BY MOUTH EVERY 6 HOURS AS NEEDED FOR WHEEZING OR  SHORTNESS  OF  BREATH 18 g 0   No current facility-administered medications for this visit.     Review of Systems  Constitutional: Positive for weight loss (2 lbs). Negative for chills, diaphoresis, fever and malaise/fatigue.       "Fairly well."  HENT: Positive for hearing loss (uses hearing aids). Negative for  congestion, ear discharge, ear pain, nosebleeds, sinus pain, sore throat and tinnitus.   Eyes: Negative.  Negative for blurred vision, double vision, photophobia and pain.  Respiratory: Positive for cough (phlegm). Negative for hemoptysis, sputum production and shortness of breath.   Cardiovascular: Positive for chest pain (unchanged). Negative for palpitations, orthopnea, leg swelling and PND.  Gastrointestinal: Negative for abdominal pain, blood in stool, constipation, diarrhea, heartburn, melena, nausea and vomiting.       Loss of appetite.  Genitourinary: Negative.  Negative for dysuria, frequency, hematuria and urgency.  Musculoskeletal: Negative for back pain, falls, joint pain, myalgias and neck pain (resolved).  Skin: Negative.  Negative for itching and rash.  Neurological: Negative.  Negative for  dizziness, tremors, sensory change, speech change, focal weakness, weakness and headaches.  Endo/Heme/Allergies: Negative.  Does not bruise/bleed easily.  Psychiatric/Behavioral: Negative.  Negative for depression and memory loss. The patient is not nervous/anxious and does not have insomnia.   All other systems reviewed and are negative.  Performance status (ECOG): 1  BP (!) 161/50 (BP Location: Left Arm, Patient Position: Sitting)   Pulse 60   Temp (!) 97.4 F (36.3 C) (Tympanic)   Resp 18   Ht _0  (1.651 m)   Wt 104 lb 6.2 oz (47.3 kg)   SpO2 96%   BMI 17.37 kg/m    Physical Exam  Constitutional: She is oriented to person, place, and time. She appears well-developed and well-nourished. No distress.  Thin appearing elderly female sitting comfortably in the exam room in no acute distress.   HENT:  Head: Normocephalic and atraumatic.  Short gray hair.  Dentures. Mask.  Eyes: Conjunctivae and EOM are normal. No scleral icterus.  Glasses.  Light hazel eyes.   Cardiovascular: Normal rate and normal heart sounds.  Pulmonary/Chest: Effort normal and breath sounds normal. No  respiratory distress. She has no wheezes. She has no rales.  Musculoskeletal:        General: No edema.  Neurological: She is alert and oriented to person, place, and time.  Skin: Skin is warm and dry. She is not diaphoretic. No erythema.  Psychiatric: She has a normal mood and affect. Judgment and thought content normal.  Nursing note and vitals reviewed.   No visits with results within 3 Day(s) from this visit.  Latest known visit with results is:  Office Visit on 10/03/2018  Component Date Value Ref Range Status  . Lipase 10/03/2018 11.0  11.0 - 59.0 U/L Final  . Cholesterol 10/03/2018 97  0 - 200 mg/dL Final   ATP III Classification       Desirable:  < 200 mg/dL               Borderline High:  200 - 239 mg/dL          High:  > = 240 mg/dL  . Triglycerides 10/03/2018 103.0  0.0 - 149.0 mg/dL Final   Normal:  <150 mg/dLBorderline High:  150 - 199 mg/dL  . HDL 10/03/2018 31.70* >39.00 mg/dL Final  . VLDL 10/03/2018 20.6  0.0 - 40.0 mg/dL Final  . LDL Cholesterol 10/03/2018 44  0 - 99 mg/dL Final  . Total CHOL/HDL Ratio 10/03/2018 3   Final                  Men          Women1/2 Average Risk     3.4          3.3Average Risk          5.0          4.42X Average Risk          9.6          7.13X Average Risk          15.0          11.0                      . NonHDL 10/03/2018 64.88   Final   NOTE:  Non-HDL goal should be 30 mg/dL higher than patient's LDL goal (i.e. LDL goal of < 70 mg/dL, would have non-HDL goal of < 100 mg/dL)  Assessment:  Amy Stone is a 78 y.o. female  with stage IB right breast cancers/p partial mastectomyon 06/02/2014. 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 Tumor was ER positive (>90%), PR positive (1-10%), and Her2/neu negative by FISH. Two sentinel lymph nodes were negative. Pathologic stage was T1bN0M0.  She received 4492 cGyto the right breast from 07/01/2014 - 07/22/2014 and 1440 cGyto the scar from 07/23/2014 -  08/05/2014.  She began Femaraafter completing radiation. Bilateral diagnostic mammogram on 05/30/2018 revealed no mammographic evidence of malignancy in either breast. There were stable right breast posttreatment changes.  CA27.29has been followed: 8.5 on 03/24/2016, 6.9 on 08/07/2016, 8.4 on 02/06/2017, 11.9 on 08/07/2017, 8.4 on 02/12/2018, and 28.4 on 09/25/2018.  Bone density studyon 10/08/2014 revealed osteoporosiswith a T score of -3.2 in the right femur and -2.4 in L1-L4. She was briefly on weeklyFosamax. She is not interested in any further treatment.  She has a history of non-invasive bladder cancer. She undergoes yearly cystoscopy (next appointment 11/2015).  She has a 25 pack year smoking history.  Symptomatically, she has a cough productive of phlegm.  She has lost 2 pounds.  Plan: 1.  Labs today: CBC with diff, CMP, CA27.29. 2.   Stage IB right breast cancer No breast concerns. Mammogram on 05/30/2018 revealed no evidence of disease. Patient remains on Femara. 3.   Bone pain             Patient has pain in multiple bones on palpation associated with weight loss.  Bone scan personally reviewed.  Images reveal no evidence of metastatic disease.     Patient scheduled for chest CT.                      4.   Weight loss  Work-up underway.  Bone scan reveals no evidence of metastatic disease. 5.   Osteoporosis Continue calcium and vitamin D. Patient declines Prolia or a bisphosphonate. 6.   MD to call patient re: chest CT scheduled for tomorrow. 7.   RTC based on imaging studies.  I discussed the assessment and treatment plan with the patient.  The patient was provided an opportunity to ask questions and all were answered.  The patient agreed with the plan and demonstrated an understanding of the instructions.  The patient was advised to call back if the symptoms worsen or if the condition fails to improve as anticipated.   I discussed the assessment and  treatment plan with the patient.  The patient was provided an opportunity to ask questions and all were answered.  The patient agreed with the plan and demonstrated an understanding of the instructions.  The patient was advised to call back if the symptoms worsen or if the condition fails to improve as anticipated.   Lequita Asal, MD, PhD    10/08/2018, 5:52 PM  I, Selena Batten, am acting as scribe for [employer]. Mike Gip, MD, PhD.  I,  C. Mike Gip, MD, have reviewed the above documentation for accuracy and completeness, and I agree with the above.

## 2018-10-09 ENCOUNTER — Other Ambulatory Visit: Payer: Self-pay

## 2018-10-09 ENCOUNTER — Encounter: Payer: Self-pay | Admitting: Hematology and Oncology

## 2018-10-09 ENCOUNTER — Inpatient Hospital Stay (HOSPITAL_BASED_OUTPATIENT_CLINIC_OR_DEPARTMENT_OTHER): Payer: Medicare HMO | Admitting: Hematology and Oncology

## 2018-10-09 VITALS — BP 136/72 | HR 57 | Temp 97.5°F | Resp 18 | Ht 64.5 in | Wt 103.7 lb

## 2018-10-09 DIAGNOSIS — I10 Essential (primary) hypertension: Secondary | ICD-10-CM | POA: Diagnosis not present

## 2018-10-09 DIAGNOSIS — E039 Hypothyroidism, unspecified: Secondary | ICD-10-CM

## 2018-10-09 DIAGNOSIS — E785 Hyperlipidemia, unspecified: Secondary | ICD-10-CM | POA: Diagnosis not present

## 2018-10-09 DIAGNOSIS — Z7982 Long term (current) use of aspirin: Secondary | ICD-10-CM

## 2018-10-09 DIAGNOSIS — I252 Old myocardial infarction: Secondary | ICD-10-CM

## 2018-10-09 DIAGNOSIS — C50911 Malignant neoplasm of unspecified site of right female breast: Secondary | ICD-10-CM

## 2018-10-09 DIAGNOSIS — Z170 Estrogen receptor positive status [ER+]: Secondary | ICD-10-CM | POA: Diagnosis not present

## 2018-10-09 DIAGNOSIS — M810 Age-related osteoporosis without current pathological fracture: Secondary | ICD-10-CM

## 2018-10-09 DIAGNOSIS — Z79811 Long term (current) use of aromatase inhibitors: Secondary | ICD-10-CM

## 2018-10-09 DIAGNOSIS — Z79899 Other long term (current) drug therapy: Secondary | ICD-10-CM

## 2018-10-09 DIAGNOSIS — M898X9 Other specified disorders of bone, unspecified site: Secondary | ICD-10-CM | POA: Insufficient documentation

## 2018-10-09 DIAGNOSIS — Z803 Family history of malignant neoplasm of breast: Secondary | ICD-10-CM

## 2018-10-09 DIAGNOSIS — J449 Chronic obstructive pulmonary disease, unspecified: Secondary | ICD-10-CM | POA: Diagnosis not present

## 2018-10-09 DIAGNOSIS — R634 Abnormal weight loss: Secondary | ICD-10-CM | POA: Insufficient documentation

## 2018-10-09 DIAGNOSIS — Z87891 Personal history of nicotine dependence: Secondary | ICD-10-CM

## 2018-10-10 ENCOUNTER — Ambulatory Visit
Admission: RE | Admit: 2018-10-10 | Discharge: 2018-10-10 | Disposition: A | Discharge status: Home / Self Care | Payer: Medicare HMO | Source: Ambulatory Visit | Attending: Family Medicine | Admitting: Family Medicine

## 2018-10-10 ENCOUNTER — Other Ambulatory Visit: Payer: Self-pay

## 2018-10-10 DIAGNOSIS — R911 Solitary pulmonary nodule: Secondary | ICD-10-CM | POA: Insufficient documentation

## 2018-10-10 DIAGNOSIS — J9809 Other diseases of bronchus, not elsewhere classified: Secondary | ICD-10-CM | POA: Diagnosis not present

## 2018-10-11 ENCOUNTER — Telehealth: Payer: Self-pay | Admitting: *Deleted

## 2018-10-11 ENCOUNTER — Other Ambulatory Visit: Payer: Self-pay | Admitting: Family Medicine

## 2018-10-11 DIAGNOSIS — C3490 Malignant neoplasm of unspecified part of unspecified bronchus or lung: Secondary | ICD-10-CM

## 2018-10-11 DIAGNOSIS — R9389 Abnormal findings on diagnostic imaging of other specified body structures: Secondary | ICD-10-CM

## 2018-10-11 NOTE — Telephone Encounter (Signed)
Spoke to [place] with [facility] and was advised that the results of the CT scan is in Epic for doctor to review.

## 2018-10-11 NOTE — Telephone Encounter (Signed)
Handled, pulm consult

## 2018-10-14 ENCOUNTER — Telehealth: Payer: Self-pay | Admitting: Internal Medicine

## 2018-10-14 NOTE — Telephone Encounter (Signed)
Per kristin and I-okay to proceed with appt.  It is previously noted, that pt does not wish to leave her home. If she would like phone or virtual visit, that will be okay as well.

## 2018-10-14 NOTE — Progress Notes (Signed)
[facility] Consultation      Assessment and Plan:  History of breast cancer, now with mediastinal lymphadenopathy, lung nodule, suspicious for recurrent cancer versus primary lung cancer. Dysphasia likely related to mediastinal mass. Chest pain/chest tightness likely related to mediastinal mass. Cough. - Discussed with patient various diagnostic options, most likely would prefer EBUS bronchoscopy for biopsy of mediastinal lymphadenopathy. - Patient is fairly adamant that she does not want further biopsies or treatments for her possible lung cancer.  We discussed that her pain and swallowing issues are likely due to this subcarinal lymphadenopathy, and she may benefit from palliative radiation or chemo.  However at this time she declines even palliative treatments as she maintains that she does not spend a lot of time in the hospital.  "Give me some pain medicine and let me go". - I will have the patient follow back up with oncology to be referred possibly to hospice and or palliative care to help manage her pain and swallowing issues.   Date: 10/15/2018  MRN# [id number] Amy Stone 10-28-1940    Amy Stone is a 78 y.o. old female seen in consultation for chief complaint of:    Chief Complaint  Patient presents with  . Consult    Dr.Bedsole referred Here for abnormal Chest CT c/o cough, chest pain, upper abd.pain since March.    HPI:  Amy Stone is a 78 y.o. old female  With a history of stage Ib right breast cancer status post partial mastectomy in February 2016 followed by radiation, she also has a history of noninvasive bladder cancer followed by yearly cystoscopy.  She had been having some right-sided breast and rib pain in May of this year, accompanied by cough and lightheadedness but declined further evaluation at that time.  She was seen by her oncologist Dr. Mike Gip, earlier this month and noted to have some substernal chest pain which is been  present for about the past year but worse over the past few months.  Subsequently she had a CT of the chest which showed enlarged subcarinal lymph node/mass.  She tells me that she has been having a cough since March, she has been on cough medicine OTC, which helped only while she was taking it and it was costing too much. She stopped it last week because it caused nausea.  She has been having central chest pain, and it hurts if she presses on the center of her chest and wearing a bra hurts. She has a lot of yellow phlegm but no blood.  Her weight has decreased about 25 to 30 pounds over the past few months.   She notes that sometimes water and food get stuck in her chest, and sometimes it comes back up once or twice per month.   **CT chest 10/10/2018>> images personally reviewed, there is severe diffuse emphysema throughout both lungs, though appears slightly worse in the apices, it is predominantly homogenous.  There is severe hyperinflation.  There is a large subcarinal lymphadenopathy, as well as posterior to the carina uncertain if it involves the esophagus.  The subcarinal portion peers to be mostly accessible, directly medial to the right mainstem bronchus.  The posterior mediastinal mass appears to be accessible posterior and medial to the left mainstem bronchus.  There is left hilar lymphadenopathy, posterior to the left upper lobe bronchus.  There is a left lower lobe mass anteriorly.  PMHX:   Past Medical History:  Diagnosis Date  . AAA (abdominal aortic  aneurysm) ([place])   . Blood transfusion   . Breast CA ([place]) 2016   right breast with lumpectomy and rad tx  . Breast screening, unspecified   . Cancer [facility]) 2015   Bladder  . 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 Cervicalgia   . Chronic airway obstruction, not elsewhere classified   . Coronary atherosclerosis of unspecified type of vessel, native or graft 2007  . Epigastric pain   . Myocardial infarction ([place]) 2007  .  Neoplasm of uncertain behavior of skin   . Osteoarthrosis, unspecified whether generalized or localized, unspecified site   . Osteoporosis, unspecified   . Other and unspecified hyperlipidemia   . Other chest pain   . Peripheral vascular disease ([place])   . Personal history of radiation therapy   . PONV (postoperative nausea and vomiting)   . Shortness of breath   . Thoracic aneurysm, ruptured ([place])   . Thyroid nodule    seen on CT Scan 07/19/2011  . Unspecified essential hypertension   . Unspecified hypothyroidism   . Unspecified tinnitus   . UTI (lower urinary tract infection)   . Wears dentures    full upper and lower   Surgical Hx:  Past Surgical History:  Procedure Laterality Date  . ABDOMINAL AORTAGRAM N/A 05/15/2011   Procedure: ABDOMINAL Maxcine Ham;  Surgeon: Angelia Mould, MD;  Location: [facility] CATH LAB;  Service: Cardiovascular;  Laterality: N/A;  . ABDOMINAL HYSTERECTOMY    . acute inferior mi  11/2005  . AORTA - BILATERAL FEMORAL ARTERY BYPASS GRAFT  07/25/2011   Procedure: AORTA BIFEMORAL BYPASS GRAFT;  Surgeon: Angelia Mould, MD;  Location: [place];  Service: Vascular;  Laterality: Bilateral;  . BREAST BIOPSY Right 2016   positive  . BREAST LUMPECTOMY Right Feb. 2016   F/U with radiation  . CATARACT EXTRACTION W/PHACO Left 09/18/2016   Procedure: CATARACT EXTRACTION PHACO AND INTRAOCULAR LENS PLACEMENT ([place]) Left;  Surgeon: Leandrew Koyanagi, MD;  Location: [facility];  Service: Ophthalmology;  Laterality: Left;  . CATARACT EXTRACTION W/PHACO Right 11/29/2016   Procedure: CATARACT EXTRACTION PHACO AND INTRAOCULAR LENS PLACEMENT ([place])  Right;  Surgeon: Leandrew Koyanagi, MD;  Location: [place];  Service: Ophthalmology;  Laterality: Right;  . CORONARY ARTERY BYPASS GRAFT  11/2005  . goiter removal  1979  . PORT WINE STAIN REMOVAL W/ LASER  2004   bladder cancer  . PR VEIN BYPASS GRAFT,AORTO-FEM-POP  July 25, 2011   Family Hx:  Family  History  Problem Relation Age of Onset  . Alzheimer's disease Father   . Parkinsonism Father   . Heart defect Mother        enlarged heart  . Heart attack Mother   . Hypertension Sister   . Diabetes Sister   . Heart disease Sister   . Hyperlipidemia Sister   . Breast cancer Sister 18  . Hypothyroidism Sister   . Diabetes Sister   . Aneurysm Paternal Grandfather   . Breast cancer Daughter 77   Social Hx:   Social History   Tobacco Use  . Smoking status: Former Smoker    Years: 1.00    Types: Cigarettes    Quit date: 11/24/2005    Years since quitting: 12.8  . Smokeless tobacco: Never Used  . Tobacco comment: 40 pack years   Substance Use Topics  . Alcohol use: Yes    Alcohol/week: 1.0 standard drinks    Types: 1 Glasses of wine per week  Comment: wine for cramps  . Drug use: No   Medication:    Current Outpatient Medications:  .  aspirin 81 MG tablet, Take 81 mg by mouth daily., Disp: , Rfl:  .  atenolol (TENORMIN) 25 MG tablet, Take 1 tablet (25 mg total) by mouth daily., Disp: 90 tablet, Rfl: 0 .  atorvastatin (LIPITOR) 80 MG tablet, Take 1 tablet (80 mg total) by mouth at bedtime., Disp: 90 tablet, Rfl: 3 .  letrozole (FEMARA) 2.5 MG tablet, TAKE 1 TABLET BY MOUTH ONCE DAILY, Disp: 90 tablet, Rfl: 3 .  levothyroxine (SYNTHROID) 50 MCG tablet, TAKE 1/2 (ONE-HALF) TABLET BY MOUTH ONCE DAILY, Disp: 45 tablet, Rfl: 3 .  losartan (COZAAR) 50 MG tablet, Take 0.5 tablets (25 mg total) by mouth daily., Disp: 45 tablet, Rfl: 1 .  VENTOLIN HFA 108 (90 Base) MCG/ACT inhaler, INHALE 2 PUFFS BY MOUTH EVERY 6 HOURS AS NEEDED FOR WHEEZING OR  SHORTNESS  OF  BREATH, Disp: 18 g, Rfl: 0   Allergies:  Iohexol and Prednisone  Review of Systems: Gen:  Denies  fever, sweats, chills HEENT: Denies blurred vision, double vision. bleeds, sore throat Cvc:  No dizziness, chest pain. Resp:   Denies cough or sputum production, shortness of breath Gi: Denies swallowing difficulty, stomach  pain. Gu:  Denies bladder incontinence, burning urine Ext:   No Joint pain, stiffness. Skin: No skin rash,  hives  Endoc:  No polyuria, polydipsia. Psych: No depression, insomnia. Other:  All other systems were reviewed with the patient and were negative other that what is mentioned in the HPI.   Physical Examination:   VS: BP 120/66 (BP Location: Left Arm, Cuff Size: Normal)   Pulse (!) 56   Temp (!) 97.4 F (36.3 C) (Skin)   Ht 5\' 4"  (1.626 m)   Wt 104 lb (47.2 kg)   SpO2 92%   BMI 17.85 kg/m   General Appearance: No distress  Neuro:without focal findings,  speech normal,  HEENT: PERRLA, EOM intact.   Pulmonary: normal breath sounds, No wheezing.  CardiovascularNormal S1,S2.  No m/r/g.   Abdomen: Benign, Soft, non-tender. Renal:  No costovertebral tenderness  GU:  No performed at this time. Endoc: No evident thyromegaly, no signs of acromegaly. Skin:   warm, no rashes, no ecchymosis  Extremities: normal, no cyanosis, clubbing.  Other findings:    LABORATORY PANEL:   CBC No results for input(s): WBC, HGB, HCT, PLT in the last 168 hours. ------------------------------------------------------------------------------------------------------------------  Chemistries  No results for input(s): NA, K, CL, CO2, GLUCOSE, BUN, CREATININE, CALCIUM, MG, AST, ALT, ALKPHOS, BILITOT in the last 168 hours.  Invalid input(s): GFRCGP ------------------------------------------------------------------------------------------------------------------  Cardiac Enzymes No results for input(s): TROPONINI in the last 168 hours. ------------------------------------------------------------  RADIOLOGY:  No results found.     Thank  you for the consultation and for allowing [facility] to assist in the care of your patient. Our recommendations are noted above.  Please contact us if we can be of further service.   Marda Stalker, M.D., F.C.C.P.  Board  Certified in Internal Medicine, Pulmonary Medicine, [place], and Sleep Medicine.  [facility] Office Number: [phone]   10/15/2018

## 2018-10-14 NOTE — Telephone Encounter (Signed)
Called patient for COVID-19 pre-screening for in office visit.  Have you recently traveled any where out of the local area in the last 2 weeks? No  Have you been in close contact with a person diagnosed with COVID-19 or someone awaiting results within the last 2 weeks? No  Do you currently have any of the following symptoms? If so, when did they start? Cough (Yes- March)    Diarrhea    Joint Pain Fever      Muscle Pain   Red eyes Shortness of breath (Yes)                   Abdominal pain                       Vomiting Loss of smell    Rash    Sore Throat Headache     Weakness   Bruising or bleeding  Chest pain- Last of April   Okay to proceed with visit. (date)  / Needs to reschedule visit. (date)

## 2018-10-14 NOTE — H&P (View-Only) (Signed)
[facility] Consultation      Assessment and Plan:  History of breast cancer, now with mediastinal lymphadenopathy, lung nodule, suspicious for recurrent cancer versus primary lung cancer. Dysphasia likely related to mediastinal mass. Chest pain/chest tightness likely related to mediastinal mass. Cough. - Discussed with patient various diagnostic options, most likely would prefer EBUS bronchoscopy for biopsy of mediastinal lymphadenopathy. - Patient is fairly adamant that she does not want further biopsies or treatments for her possible lung cancer.  We discussed that her pain and swallowing issues are likely due to this subcarinal lymphadenopathy, and she may benefit from palliative radiation or chemo.  However at this time she declines even palliative treatments as she maintains that she does not spend a lot of time in the hospital.  "Give me some pain medicine and let me go". - I will have the patient follow back up with oncology to be referred possibly to hospice and or palliative care to help manage her pain and swallowing issues.   Date: 10/15/2018  MRN# [id number] Amy Stone 29-Jan-1941    Amy Stone is a 79 y.o. old female seen in consultation for chief complaint of:    Chief Complaint  Patient presents with  . Consult    Dr.Bedsole referred Here for abnormal Chest CT c/o cough, chest pain, upper abd.pain since March.    HPI:  Amy Stone is a 78 y.o. old female  With a history of stage Ib right breast cancer status post partial mastectomy in February 2016 followed by radiation, she also has a history of noninvasive bladder cancer followed by yearly cystoscopy.  She had been having some right-sided breast and rib pain in May of this year, accompanied by cough and lightheadedness but declined further evaluation at that time.  She was seen by her oncologist Dr. Mike Gip, earlier this month and noted to have some substernal chest pain which is been  present for about the past year but worse over the past few months.  Subsequently she had a CT of the chest which showed enlarged subcarinal lymph node/mass.  She tells me that she has been having a cough since March, she has been on cough medicine OTC, which helped only while she was taking it and it was costing too much. She stopped it last week because it caused nausea.  She has been having central chest pain, and it hurts if she presses on the center of her chest and wearing a bra hurts. She has a lot of yellow phlegm but no blood.  Her weight has decreased about 25 to 30 pounds over the past few months.   She notes that sometimes water and food get stuck in her chest, and sometimes it comes back up once or twice per month.   **CT chest 10/10/2018>> images personally reviewed, there is severe diffuse emphysema throughout both lungs, though appears slightly worse in the apices, it is predominantly homogenous.  There is severe hyperinflation.  There is a large subcarinal lymphadenopathy, as well as posterior to the carina uncertain if it involves the esophagus.  The subcarinal portion peers to be mostly accessible, directly medial to the right mainstem bronchus.  The posterior mediastinal mass appears to be accessible posterior and medial to the left mainstem bronchus.  There is left hilar lymphadenopathy, posterior to the left upper lobe bronchus.  There is a left lower lobe mass anteriorly.  PMHX:   Past Medical History:  Diagnosis Date  . AAA (abdominal aortic  aneurysm) ([place])   . Blood transfusion   . Breast CA ([place]) 2016   right breast with lumpectomy and rad tx  . Breast screening, unspecified   . Cancer [facility]) 2015   Bladder  . 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 Cervicalgia   . Chronic airway obstruction, not elsewhere classified   . Coronary atherosclerosis of unspecified type of vessel, native or graft 2007  . Epigastric pain   . Myocardial infarction ([place]) 2007  .  Neoplasm of uncertain behavior of skin   . Osteoarthrosis, unspecified whether generalized or localized, unspecified site   . Osteoporosis, unspecified   . Other and unspecified hyperlipidemia   . Other chest pain   . Peripheral vascular disease ([place])   . Personal history of radiation therapy   . PONV (postoperative nausea and vomiting)   . Shortness of breath   . Thoracic aneurysm, ruptured ([place])   . Thyroid nodule    seen on CT Scan 07/19/2011  . Unspecified essential hypertension   . Unspecified hypothyroidism   . Unspecified tinnitus   . UTI (lower urinary tract infection)   . Wears dentures    full upper and lower   Surgical Hx:  Past Surgical History:  Procedure Laterality Date  . ABDOMINAL AORTAGRAM N/A 05/15/2011   Procedure: ABDOMINAL Maxcine Ham;  Surgeon: Angelia Mould, MD;  Location[facility] C[facility] LAB;  Service: Cardiovascular;  Laterality: N/A;  . ABDOMINAL HYSTERECTOMY    . acute inferior mi  11/2005  . AORTA - BILATERAL FEMORAL ARTERY BYPASS GRAFT  07/25/2011   Procedure: AORTA BIFEMORAL BYPASS GRAFT;  Surgeon: Angelia Mould, MD;  Location: [place];  Service: Vascular;  Laterality: Bilateral;  . BREAST BIOPSY Right 2016   positive  . BREAST LUMPECTOMY Right Feb. 2016   F/U with radiation  . CATARACT EXTRACTION W/PHACO Left 09/18/2016   Procedure: CATARACT EXTRACTION PHACO AND INTRAOCULAR LENS PLACEMENT ([place]) Left;  Surgeon: Leandrew Koyanagi, MD;  Location: [place];  Service: Ophthalmology;  Laterality: Left;  . CATARACT EXTRACTION W/PHACO Right 11/29/2016   Procedure: CATARACT EXTRACTION PHACO AND INTRAOCULAR LENS PLACEMENT ([place])  Right;  Surgeon: Leandrew Koyanagi, MD;  Location: [place];  Service: Ophthalmology;  Laterality: Right;  . CORONARY ARTERY BYPASS GRAFT  11/2005  . goiter removal  1979  . PORT WINE STAIN REMOVAL W/ LASER  2004   bladder cancer  . PR VEIN BYPASS GRAFT,AORTO-FEM-POP  July 25, 2011   Family Hx:  Family  History  Problem Relation Age of Onset  . Alzheimer's disease Father   . Parkinsonism Father   . Heart defect Mother        enlarged heart  . Heart attack Mother   . Hypertension Sister   . Diabetes Sister   . Heart disease Sister   . Hyperlipidemia Sister   . Breast cancer Sister 21  . Hypothyroidism Sister   . Diabetes Sister   . Aneurysm Paternal Grandfather   . Breast cancer Daughter 23   Social Hx:   Social History   Tobacco Use  . Smoking status: Former Smoker    Years: 1.00    Types: Cigarettes    Quit date: 11/24/2005    Years since quitting: 12.8  . Smokeless tobacco: Never Used  . Tobacco comment: 40 pack years   Substance Use Topics  . Alcohol use: Yes    Alcohol/week: 1.0 standard drinks    Types: 1 Glasses of wine per week  Comment: wine for cramps  . Drug use: No   Medication:    Current Outpatient Medications:  .  aspirin 81 MG tablet, Take 81 mg by mouth daily., Disp: , Rfl:  .  atenolol (TENORMIN) 25 MG tablet, Take 1 tablet (25 mg total) by mouth daily., Disp: 90 tablet, Rfl: 0 .  atorvastatin (LIPITOR) 80 MG tablet, Take 1 tablet (80 mg total) by mouth at bedtime., Disp: 90 tablet, Rfl: 3 .  letrozole (FEMARA) 2.5 MG tablet, TAKE 1 TABLET BY MOUTH ONCE DAILY, Disp: 90 tablet, Rfl: 3 .  levothyroxine (SYNTHROID) 50 MCG tablet, TAKE 1/2 (ONE-HALF) TABLET BY MOUTH ONCE DAILY, Disp: 45 tablet, Rfl: 3 .  losartan (COZAAR) 50 MG tablet, Take 0.5 tablets (25 mg total) by mouth daily., Disp: 45 tablet, Rfl: 1 .  VENTOLIN HFA 108 (90 Base) MCG/ACT inhaler, INHALE 2 PUFFS BY MOUTH EVERY 6 HOURS AS NEEDED FOR WHEEZING OR  SHORTNESS  OF  BREATH, Disp: 18 g, Rfl: 0   Allergies:  Iohexol and Prednisone  Review of Systems: Gen:  Denies  fever, sweats, chills HEENT: Denies blurred vision, double vision. bleeds, sore throat Cvc:  No dizziness, chest pain. Resp:   Denies cough or sputum production, shortness of breath Gi: Denies swallowing difficulty, stomach  pain. Gu:  Denies bladder incontinence, burning urine Ext:   No Joint pain, stiffness. Skin: No skin rash,  hives  Endoc:  No polyuria, polydipsia. Psych: No depression, insomnia. Other:  All other systems were reviewed with the patient and were negative other that what is mentioned in the HPI.   Physical Examination:   VS: BP 120/66 (BP Location: Left Arm, Cuff Size: Normal)   Pulse (!) 56   Temp (!) 97.4 F (36.3 C) (Skin)   Ht 5\' 4"  (1.626 m)   Wt 104 lb (47.2 kg)   SpO2 92%   BMI 17.85 kg/m   General Appearance: No distress  Neuro:without focal findings,  speech normal,  HEENT: PERRLA, EOM intact.   Pulmonary: normal breath sounds, No wheezing.  CardiovascularNormal S1,S2.  No m/r/g.   Abdomen: Benign, Soft, non-tender. Renal:  No costovertebral tenderness  GU:  No performed at this time. Endoc: No evident thyromegaly, no signs of acromegaly. Skin:   warm, no rashes, no ecchymosis  Extremities: normal, no cyanosis, clubbing.  Other findings:    LABORATORY PANEL:   CBC No results for input(s): WBC, HGB, HCT, PLT in the last 168 hours. ------------------------------------------------------------------------------------------------------------------  Chemistries  No results for input(s): NA, K, CL, CO2, GLUCOSE, BUN, CREATININE, CALCIUM, MG, AST, ALT, ALKPHOS, BILITOT in the last 168 hours.  Invalid input(s): GFRCGP ------------------------------------------------------------------------------------------------------------------  Cardiac Enzymes No results for input(s): TROPONINI in the last 168 hours. ------------------------------------------------------------  RADIOLOGY:  No results found.     Thank  you for the consultation and for allowing [facility] to assist in the care of your patient. Our recommendations are noted above.  Please contact us if we can be of further service.   Marda Stalker, M.D., F.C.C.P.  Board  Certified in Internal Medicine, Pulmonary Medicine, [place], and Sleep Medicine.  [facility] Office Number: [phone]   10/15/2018

## 2018-10-15 ENCOUNTER — Ambulatory Visit: Payer: Medicare HMO | Admitting: Internal Medicine

## 2018-10-15 ENCOUNTER — Encounter: Payer: Self-pay | Admitting: Internal Medicine

## 2018-10-15 VITALS — BP 120/66 | HR 56 | Temp 97.4°F | Ht 64.0 in | Wt 104.0 lb

## 2018-10-15 DIAGNOSIS — R590 Localized enlarged lymph nodes: Secondary | ICD-10-CM

## 2018-10-15 MED ORDER — BENZONATATE 100 MG PO CAPS: 200.0000 mg | ORAL_CAPSULE | Freq: Three times a day (TID) | ORAL | 2 refills | Status: AC

## 2018-10-15 NOTE — Patient Instructions (Addendum)
We will start you on cough medicine.  If you change your mind and you would like the biopsy please call us back.

## 2018-10-15 NOTE — Progress Notes (Signed)
Noted  

## 2018-10-16 ENCOUNTER — Telehealth: Payer: Self-pay | Admitting: Internal Medicine

## 2018-10-16 NOTE — Telephone Encounter (Addendum)
[facility] 10/30/2018 at 1300 with Dr. Ashby Dawes- confirmed with pt's sister Izora Gala that this date/time works for pt.(pt gave verbal to speak with Izora Gala) Dx:Mediastinal lymphadenopathy. WNP:[id number]. Will await confirmation from surgery booking.

## 2018-10-17 ENCOUNTER — Ambulatory Visit: Payer: Medicare HMO | Admitting: Internal Medicine

## 2018-10-17 NOTE — Telephone Encounter (Signed)
10/17/2018 at 4:01 pm EST spoke with Lubertha South at [facility].  Procedure codes [phone] and [id number] does not require PA or Referral from Primary Care. Call Ref  # [id number]. Rhonda J Cobb

## 2018-10-17 NOTE — Telephone Encounter (Signed)
Received confirmation from surgery booking.  Pre admit 7/10 at 1:30 in office with covid testing.  ATC pt- received busy dial.

## 2018-10-17 NOTE — Telephone Encounter (Signed)
Pt's spouse, Jeneen Rinks [facility]) is aware of below dates/time.  Jeneen Rinks voiced his understanding and had no further questions.  Nothing further is needed.

## 2018-10-22 ENCOUNTER — Ambulatory Visit: Payer: Medicare HMO | Admitting: Hematology and Oncology

## 2018-10-25 ENCOUNTER — Encounter
Admission: RE | Admit: 2018-10-25 | Discharge: 2018-10-25 | Disposition: A | Discharge status: Home / Self Care | Payer: Medicare HMO | Source: Ambulatory Visit | Attending: Internal Medicine | Admitting: Internal Medicine

## 2018-10-25 ENCOUNTER — Other Ambulatory Visit: Payer: Self-pay

## 2018-10-25 DIAGNOSIS — Z1159 Encounter for screening for other viral diseases: Secondary | ICD-10-CM | POA: Diagnosis not present

## 2018-10-25 DIAGNOSIS — Z01818 Encounter for other preprocedural examination: Secondary | ICD-10-CM | POA: Diagnosis not present

## 2018-10-25 DIAGNOSIS — I10 Essential (primary) hypertension: Secondary | ICD-10-CM | POA: Diagnosis not present

## 2018-10-25 DIAGNOSIS — R9431 Abnormal electrocardiogram [ECG] [EKG]: Secondary | ICD-10-CM | POA: Insufficient documentation

## 2018-10-25 NOTE — Patient Instructions (Signed)
Your procedure is scheduled on: Wednesday 10/30/18 Report to [place] [facility]. To find out your arrival time please call [phone] between 1PM - 3PM on Tuesday 10/29/18.  Remember: Instructions that are not followed completely may result in serious medical risk, up to and including death, or upon the discretion of your surgeon and anesthesiologist your surgery may need to be rescheduled.     _X__ 1. Do not eat food after midnight the night before your procedure.                 No gum chewing or hard candies. You may drink clear liquids up to 2 hours                 before you are scheduled to arrive for your surgery- DO not drink clear                 liquids within 2 hours of the start of your surgery.                 Clear Liquids include:  water, apple juice without pulp, clear carbohydrate                 drink such as Clearfast or Gatorade, Black Coffee or Tea (Do not add                 anything to coffee or tea). Diabetics water only  __X__2.  On the morning of surgery brush your teeth with toothpaste and water, you                 may rinse your mouth with mouthwash if you wish.  Do not swallow any              toothpaste of mouthwash.     _X__ 3.  No Alcohol for 24 hours before or after surgery.   _X__ 4.  Do Not Smoke or use e-cigarettes For 24 Hours Prior to Your Surgery.                 Do not use any chewable tobacco products for at least 6 hours prior to                 surgery.  ____  5.  Bring all medications with you on the day of surgery if instructed.   __X__  6.  Notify your doctor if there is any change in your medical condition      (cold, fever, infections).     Do not wear jewelry, make-up, hairpins, clips or nail polish. Do not wear lotions, powders, or perfumes.  Do not shave 48 hours prior to surgery. Men may shave face and neck. Do not bring valuables to the hospital.    [facility] is not responsible for  any belongings or valuables.  Contacts, dentures/partials or body piercings may not be worn into surgery. Bring a case for your contacts, glasses or hearing aids, a denture cup will be supplied. Leave your suitcase in the car. After surgery it may be brought to your room. For patients admitted to the hospital, discharge time is determined by your treatment team.   Patients discharged the day of surgery will not be allowed to drive home.   Please read over the following fact sheets that you were given:   MRSA Information  __X__ Take these medicines the morning of surgery with A SIP OF WATER:  1. atenolol (TENORMIN  2. benzonatate (TESSALON PERLES  3. letrozole ([place]  4.  5.  6.  ____ Fleet Enema (as directed)   __ __ Use CHG Soap/SAGE wipes as directed  __X__ Use inhalers on the day of surgery  ____ Stop metformin/Janumet/Farxiga 2 days prior to surgery    ____ Take 1/2 of usual insulin dose the night before surgery. No insulin the morning          of surgery.   ____ Stop Blood Thinners Coumadin/Plavix/Xarelto/Pleta/Pradaxa/Eliquis/Effient/Aspirin  on   Or contact your Surgeon, Cardiologist or Medical Doctor regarding  ability to stop your blood thinners  __X__ Stop Anti-inflammatories 7 days before surgery such as Advil, Ibuprofen, Motrin,  BC or Goodies Powder, Naprosyn, Naproxen, Aleve, Aspirin    __X__ Stop all herbal supplements, fish oil or vitamin E until after surgery.    ____ Bring C-Pap to the hospital.

## 2018-10-26 LAB — SARS CORONAVIRUS 2 (TAT 6-24 HRS): SARS Coronavirus 2: NEGATIVE

## 2018-10-29 ENCOUNTER — Encounter: Payer: Self-pay | Admitting: Anesthesiology

## 2018-10-30 ENCOUNTER — Ambulatory Visit: Payer: Medicare HMO | Admitting: Anesthesiology

## 2018-10-30 ENCOUNTER — Other Ambulatory Visit: Payer: Self-pay

## 2018-10-30 ENCOUNTER — Ambulatory Visit: Payer: Medicare HMO

## 2018-10-30 ENCOUNTER — Ambulatory Visit
Admission: RE | Admit: 2018-10-30 | Discharge: 2018-10-30 | Disposition: A | Discharge status: Home / Self Care | Payer: Medicare HMO | Attending: Internal Medicine | Admitting: Internal Medicine

## 2018-10-30 ENCOUNTER — Encounter
Admission: RE | Disposition: A | Discharge status: Home / Self Care | Payer: Self-pay | Source: Home / Self Care | Attending: Internal Medicine

## 2018-10-30 ENCOUNTER — Encounter: Payer: Self-pay | Admitting: *Deleted

## 2018-10-30 DIAGNOSIS — R05 Cough: Secondary | ICD-10-CM

## 2018-10-30 DIAGNOSIS — E039 Hypothyroidism, unspecified: Secondary | ICD-10-CM | POA: Insufficient documentation

## 2018-10-30 DIAGNOSIS — Z87891 Personal history of nicotine dependence: Secondary | ICD-10-CM | POA: Insufficient documentation

## 2018-10-30 DIAGNOSIS — I10 Essential (primary) hypertension: Secondary | ICD-10-CM | POA: Insufficient documentation

## 2018-10-30 DIAGNOSIS — Z79899 Other long term (current) drug therapy: Secondary | ICD-10-CM | POA: Diagnosis not present

## 2018-10-30 DIAGNOSIS — Z9011 Acquired absence of right breast and nipple: Secondary | ICD-10-CM | POA: Diagnosis not present

## 2018-10-30 DIAGNOSIS — Z79890 Hormone replacement therapy (postmenopausal): Secondary | ICD-10-CM | POA: Insufficient documentation

## 2018-10-30 DIAGNOSIS — C771 Secondary and unspecified malignant neoplasm of intrathoracic lymph nodes: Secondary | ICD-10-CM | POA: Insufficient documentation

## 2018-10-30 DIAGNOSIS — J449 Chronic obstructive pulmonary disease, unspecified: Secondary | ICD-10-CM | POA: Diagnosis not present

## 2018-10-30 DIAGNOSIS — E785 Hyperlipidemia, unspecified: Secondary | ICD-10-CM | POA: Diagnosis not present

## 2018-10-30 DIAGNOSIS — C3402 Malignant neoplasm of left main bronchus: Secondary | ICD-10-CM | POA: Insufficient documentation

## 2018-10-30 DIAGNOSIS — I252 Old myocardial infarction: Secondary | ICD-10-CM | POA: Insufficient documentation

## 2018-10-30 DIAGNOSIS — Z79811 Long term (current) use of aromatase inhibitors: Secondary | ICD-10-CM | POA: Insufficient documentation

## 2018-10-30 DIAGNOSIS — Z923 Personal history of irradiation: Secondary | ICD-10-CM | POA: Insufficient documentation

## 2018-10-30 DIAGNOSIS — Z95820 Peripheral vascular angioplasty status with implants and grafts: Secondary | ICD-10-CM | POA: Insufficient documentation

## 2018-10-30 DIAGNOSIS — I714 Abdominal aortic aneurysm, without rupture: Secondary | ICD-10-CM | POA: Insufficient documentation

## 2018-10-30 DIAGNOSIS — Z8551 Personal history of malignant neoplasm of bladder: Secondary | ICD-10-CM | POA: Insufficient documentation

## 2018-10-30 DIAGNOSIS — I739 Peripheral vascular disease, unspecified: Secondary | ICD-10-CM | POA: Insufficient documentation

## 2018-10-30 DIAGNOSIS — I2510 Atherosclerotic heart disease of native coronary artery without angina pectoris: Secondary | ICD-10-CM | POA: Diagnosis not present

## 2018-10-30 DIAGNOSIS — R911 Solitary pulmonary nodule: Secondary | ICD-10-CM | POA: Diagnosis not present

## 2018-10-30 DIAGNOSIS — Z9889 Other specified postprocedural states: Secondary | ICD-10-CM

## 2018-10-30 DIAGNOSIS — Z7982 Long term (current) use of aspirin: Secondary | ICD-10-CM | POA: Diagnosis not present

## 2018-10-30 DIAGNOSIS — R590 Localized enlarged lymph nodes: Secondary | ICD-10-CM | POA: Diagnosis not present

## 2018-10-30 DIAGNOSIS — Z853 Personal history of malignant neoplasm of breast: Secondary | ICD-10-CM | POA: Diagnosis not present

## 2018-10-30 DIAGNOSIS — Z951 Presence of aortocoronary bypass graft: Secondary | ICD-10-CM | POA: Insufficient documentation

## 2018-10-30 HISTORY — PX: ENDOBRONCHIAL ULTRASOUND: SHX5096

## 2018-10-30 SURGERY — ENDOBRONCHIAL ULTRASOUND (EBUS)
Anesthesia: General

## 2018-10-30 MED ORDER — LIDOCAINE HCL (PF) 2 % IJ SOLN
INTRAMUSCULAR | Status: AC
  Filled 2018-10-30: qty 10

## 2018-10-30 MED ORDER — FENTANYL CITRATE (PF) 100 MCG/2ML IJ SOLN
INTRAMUSCULAR | Status: DC | PRN
  Administered 2018-10-30 (×2): 25 ug via INTRAVENOUS

## 2018-10-30 MED ORDER — FAMOTIDINE 20 MG PO TABS
20.0000 mg | ORAL_TABLET | Freq: Once | ORAL | Status: AC
  Administered 2018-10-30: 20 mg via ORAL

## 2018-10-30 MED ORDER — ONDANSETRON HCL 4 MG/2ML IJ SOLN
INTRAMUSCULAR | Status: AC
  Filled 2018-10-30: qty 2

## 2018-10-30 MED ORDER — SUGAMMADEX SODIUM 200 MG/2ML IV SOLN
INTRAVENOUS | Status: AC
  Filled 2018-10-30: qty 2

## 2018-10-30 MED ORDER — FENTANYL CITRATE (PF) 100 MCG/2ML IJ SOLN: 25.0000 ug | INTRAMUSCULAR | Status: DC | PRN

## 2018-10-30 MED ORDER — FENTANYL CITRATE (PF) 100 MCG/2ML IJ SOLN
INTRAMUSCULAR | Status: AC
  Filled 2018-10-30: qty 2

## 2018-10-30 MED ORDER — SUGAMMADEX SODIUM 200 MG/2ML IV SOLN
INTRAVENOUS | Status: DC | PRN
  Administered 2018-10-30: 100 mg via INTRAVENOUS

## 2018-10-30 MED ORDER — LIDOCAINE HCL (CARDIAC) PF 100 MG/5ML IV SOSY
PREFILLED_SYRINGE | INTRAVENOUS | Status: DC | PRN
  Administered 2018-10-30: 60 mg via INTRAVENOUS

## 2018-10-30 MED ORDER — DEXAMETHASONE SODIUM PHOSPHATE 4 MG/ML IJ SOLN
INTRAMUSCULAR | Status: AC
  Filled 2018-10-30: qty 1

## 2018-10-30 MED ORDER — ONDANSETRON HCL 4 MG/2ML IJ SOLN
4.0000 mg | Freq: Once | INTRAMUSCULAR | Status: AC | PRN
  Administered 2018-10-30: 15:00:00 4 mg via INTRAVENOUS

## 2018-10-30 MED ORDER — PROPOFOL 10 MG/ML IV BOLUS
INTRAVENOUS | Status: DC | PRN
  Administered 2018-10-30: 100 mg via INTRAVENOUS

## 2018-10-30 MED ORDER — PROPOFOL 10 MG/ML IV BOLUS
INTRAVENOUS | Status: AC
  Filled 2018-10-30: qty 20

## 2018-10-30 MED ORDER — FAMOTIDINE 20 MG PO TABS
ORAL_TABLET | ORAL | Status: AC
  Administered 2018-10-30: 13:00:00 20 mg via ORAL
  Filled 2018-10-30: qty 1

## 2018-10-30 MED ORDER — LACTATED RINGERS IV SOLN
INTRAVENOUS | Status: DC
  Administered 2018-10-30: 13:00:00 via INTRAVENOUS

## 2018-10-30 MED ORDER — PHENYLEPHRINE HCL (PRESSORS) 10 MG/ML IV SOLN
INTRAVENOUS | Status: DC | PRN
  Administered 2018-10-30 (×2): 50 ug via INTRAVENOUS

## 2018-10-30 MED ORDER — SUCCINYLCHOLINE CHLORIDE 20 MG/ML IJ SOLN
INTRAMUSCULAR | Status: DC | PRN
  Administered 2018-10-30: 80 mg via INTRAVENOUS

## 2018-10-30 MED ORDER — ROCURONIUM BROMIDE 100 MG/10ML IV SOLN
INTRAVENOUS | Status: DC | PRN
  Administered 2018-10-30: 5 mg via INTRAVENOUS

## 2018-10-30 MED ORDER — SUCCINYLCHOLINE CHLORIDE 20 MG/ML IJ SOLN
INTRAMUSCULAR | Status: AC
  Filled 2018-10-30: qty 1

## 2018-10-30 MED ORDER — ONDANSETRON HCL 4 MG/2ML IJ SOLN
INTRAMUSCULAR | Status: DC | PRN
  Administered 2018-10-30: 4 mg via INTRAVENOUS

## 2018-10-30 MED ORDER — ROCURONIUM BROMIDE 50 MG/5ML IV SOLN
INTRAVENOUS | Status: AC
  Filled 2018-10-30: qty 1

## 2018-10-30 NOTE — Anesthesia Post-op Follow-up Note (Signed)
Anesthesia QCDR form completed.        

## 2018-10-30 NOTE — Anesthesia Preprocedure Evaluation (Signed)
Anesthesia Evaluation  Patient identified by MRN, date of birth, ID band Patient awake    Reviewed: Allergy & Precautions, NPO status , Patient's Chart, lab work & pertinent test results, reviewed documented beta blocker date and time   Airway Mallampati: II  TM Distance: >3 FB     Dental  (+) Upper Dentures, Lower Dentures   Pulmonary shortness of breath, COPD, former smoker,           Cardiovascular hypertension, Pt. on medications and Pt. on home beta blockers + CAD, + Past MI, + CABG and + Peripheral Vascular Disease       Neuro/Psych  Headaches,    GI/Hepatic   Endo/Other  Hypothyroidism   Renal/GU      Musculoskeletal  (+) Arthritis ,   Abdominal   Peds  Hematology   Anesthesia Other Findings   Reproductive/Obstetrics                             Anesthesia Physical Anesthesia Plan  ASA: III  Anesthesia Plan: General   Post-op Pain Management:    Induction: Intravenous  PONV Risk Score and Plan:   Airway Management Planned: Oral ETT  Additional Equipment:   Intra-op Plan:   Post-operative Plan:   Informed Consent: I have reviewed the patients History and Physical, chart, labs and discussed the procedure including the risks, benefits and alternatives for the proposed anesthesia with the patient or authorized representative who has indicated his/her understanding and acceptance.       Plan Discussed with: CRNA  Anesthesia Plan Comments:         Anesthesia Quick Evaluation

## 2018-10-30 NOTE — Progress Notes (Signed)
[facility]  [address] [place], [place] [address] Phone: [phone]  Fax: [phone]   Clinic Day:  11/01/2018  Referring physician: Jinny Sanders, MD  Chief Complaint: Amy Stone is a 78 y.o. female with stage IB right breast cancer who is seen for 3 week assessment to review interval chest CT and bronchoscopy and discussion regarding direction of therapy.  HPI: The patient was last seen in the medical oncology clinic on 10/09/2018.  At that time, she had a cough productive of phlegm. She had lost 2 pounds.  Chest CT without contrast on 10/10/2018 revealed a 2.5 cm nodule in the left lower lobe. This lesion corresponded with the abnormality on the recent chest radiograph.  In addition, there was evidence for extensive mediastinal lymphadenopathy with a 3.8 cm posterior mediastinal mass or lymph node. Findings were most compatible with primary lung cancer. Recommendation was for further characterization with a PET-CT.  She was seen in pulmonology on 10/15/2018 by Dr. Ashby Dawes.  EBUS bronchoscopy was recommended.  She did not want further biopsies or treatment for possible lung cancer.  She declined palliative treatment.  She then changed her mind after talking with her family.  Bronchoscopy was scheduled.  She underwent bronchoscopy with EBUS on 10/30/2018.  Findings revealed an endobronchial lesion in the left mainstem bronchus with 20% occlusion of the left mainstem bronchus.  There was an enlarged subcarinal node as well as a subcarinal mass.  Pathology revealed squamous cell carcinoma.  During the interim, she is doing "fine."   She reports that her breathing is good. She reports chest pain this morning. She denies any dysphagia. She is tired and fatigued. She has soreness in her right arm.   She adamantly declines radiation and chemotherapy. She reports radiation made her sick when she underwent it for breast cancer. She understands  foregoing treatment will result in continued shortness of breath, coughing, and difficulty breathing.   She describes a neighbor with lung cancer who underwent radiation and chemotherapy. This friend was sick the entire time she received treatment; she is very opposed to having that happen to her.   She will consider Hospice care. Her husband would prefer to get Hospice care involved. She will talk over the situation with her family. She is "ok" with her daughter being contacted to discuss her situation.    Past Medical History:  Diagnosis Date  . AAA (abdominal aortic aneurysm) ([place])   . Blood transfusion   . Breast CA ([place]) 2016   right breast with lumpectomy and rad tx  . Breast screening, unspecified   . Cancer [facility]) 2015   Bladder  . 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 Cervicalgia   . Chronic airway obstruction, not elsewhere classified   . Coronary atherosclerosis of unspecified type of vessel, native or graft 2007  . Epigastric pain   . Myocardial infarction ([place]) 2007  . Neoplasm of uncertain behavior of skin   . Osteoarthrosis, unspecified whether generalized or localized, unspecified site   . Osteoporosis, unspecified   . Other and unspecified hyperlipidemia   . Other chest pain   . Peripheral vascular disease ([place])   . Personal history of radiation therapy   . PONV (postoperative nausea and vomiting)   . Shortness of breath   . Thoracic aneurysm, ruptured ([place])   . Thyroid nodule    seen on CT Scan 07/19/2011  . Unspecified essential hypertension   . Unspecified hypothyroidism   .  Unspecified tinnitus   . UTI (lower urinary tract infection)   . Wears dentures    full upper and lower    Past Surgical History:  Procedure Laterality Date  . ABDOMINAL AORTAGRAM N/A 05/15/2011   Procedure: ABDOMINAL Maxcine Ham;  Surgeon: Angelia Mould, MD;  Location: [facility] CATH LAB;  Service: Cardiovascular;  Laterality: N/A;  . ABDOMINAL HYSTERECTOMY    . acute inferior  mi  11/2005  . AORTA - BILATERAL FEMORAL ARTERY BYPASS GRAFT  07/25/2011   Procedure: AORTA BIFEMORAL BYPASS GRAFT;  Surgeon: Angelia Mould, MD;  Location: [facility];  Service: Vascular;  Laterality: Bilateral;  . BREAST BIOPSY Right 2016   positive  . BREAST LUMPECTOMY Right Feb. 2016   F/U with radiation  . CATARACT EXTRACTION W/PHACO Left 09/18/2016   Procedure: CATARACT EXTRACTION PHACO AND INTRAOCULAR LENS PLACEMENT ([place]) Left;  Surgeon: Leandrew Koyanagi, MD;  Location: [facility];  Service: Ophthalmology;  Laterality: Left;  . CATARACT EXTRACTION W/PHACO Right 11/29/2016   Procedure: CATARACT EXTRACTION PHACO AND INTRAOCULAR LENS PLACEMENT ([place])  Right;  Surgeon: Leandrew Koyanagi, MD;  Location: [facility];  Service: Ophthalmology;  Laterality: Right;  . CORONARY ARTERY BYPASS GRAFT  11/2005  . ENDOBRONCHIAL ULTRASOUND N/A 10/30/2018   Procedure: ENDOBRONCHIAL ULTRASOUND;  Surgeon: Laverle Hobby, MD;  Location: [facility] ORS;  Service: Pulmonary;  Laterality: N/A;  . goiter removal  1979  . PORT WINE STAIN REMOVAL W/ LASER  2004   bladder cancer  . PR VEIN BYPASS GRAFT,AORTO-FEM-POP  July 25, 2011    Family History  Problem Relation Age of Onset  . Alzheimer's disease Father   . Parkinsonism Father   . Heart defect Mother        enlarged heart  . Heart attack Mother   . Hypertension Sister   . Diabetes Sister   . Heart disease Sister   . Hyperlipidemia Sister   . Breast cancer Sister 21  . Hypothyroidism Sister   . Diabetes Sister   . Aneurysm Paternal Grandfather   . Breast cancer Daughter 63    Social History:  reports that she quit smoking about 12 years ago. Her smoking use included cigarettes. She quit after 1.00 year of use. She has never used smokeless tobacco. She reports current alcohol use of about 1.0 standard drinks of alcohol per week. She reports that she does not use drugs. She reports that she does not use drugs. She quit smoking on  11/24/2005.  The patient is alone today, with her husband Linna Hoff over the [place].   Allergies:  Allergies  Allergen Reactions  . Iohexol Hives and Other (See Comments)     Code: HIVES, Desc: CONTRAST REACTION OF HIVES (LARGE WHELTS DEVELOPED OVER ENTIRE BODY/MMS   . Prednisone Other (See Comments)    hallucinations    Current Medications: Current Outpatient Medications  Medication Sig Dispense Refill  . aspirin 81 MG tablet Take 81 mg by mouth daily.    [name] atenolol (TENORMIN) 25 MG tablet Take 1 tablet (25 mg total) by mouth daily. 90 tablet 0  . atorvastatin (LIPITOR) 80 MG tablet Take 1 tablet (80 mg total) by mouth at bedtime. 90 tablet 3  . letrozole (FEMARA) 2.5 MG tablet TAKE 1 TABLET BY MOUTH ONCE DAILY (Patient taking differently: Take 2.5 mg by mouth daily. ) 90 tablet 3  . levothyroxine (SYNTHROID) 50 MCG tablet TAKE 1/2 (ONE-HALF) TABLET BY MOUTH ONCE DAILY (Patient taking differently: Take 25 mcg by mouth at bedtime. )  45 tablet 3  . losartan (COZAAR) 50 MG tablet Take 0.5 tablets (25 mg total) by mouth daily. 45 tablet 1  . VENTOLIN HFA 108 (90 Base) MCG/ACT inhaler INHALE 2 PUFFS BY MOUTH EVERY 6 HOURS AS NEEDED FOR WHEEZING OR  SHORTNESS  OF  BREATH (Patient taking differently: Inhale 2 puffs into the lungs every 6 (six) hours as needed for wheezing or shortness of breath. ) 18 g 0  . benzonatate (TESSALON PERLES) 100 MG capsule Take 2 capsules (200 mg total) by mouth 3 (three) times daily. (Patient not taking: Reported on 11/01/2018) 90 capsule 2   No current facility-administered medications for this visit.     Review of Systems  Constitutional: Positive for weight loss (4 lbs). Negative for chills, diaphoresis, fever and malaise/fatigue.       Feels "fine".  HENT: Positive for hearing loss (uses hearing aids). Negative for congestion, ear pain, nosebleeds, sinus pain and sore throat.   Eyes: Negative.  Negative for blurred vision, double vision, photophobia and pain.   Respiratory: Positive for cough (phlegm). Negative for hemoptysis, sputum production and shortness of breath.   Cardiovascular: Positive for chest pain (unchanged; none this morning). Negative for palpitations, orthopnea, leg swelling and PND.  Gastrointestinal: Negative.  Negative for abdominal pain, blood in stool, constipation, diarrhea, melena, nausea and vomiting.       No swallowing issues today.  Genitourinary: Negative.  Negative for dysuria, frequency, hematuria and urgency.  Musculoskeletal: Negative for back pain, joint pain and myalgias.       Soreness in right arm.  Skin: Negative.  Negative for itching and rash.  Neurological: Negative.  Negative for dizziness, tremors, sensory change, speech change, focal weakness, weakness and headaches.  Endo/Heme/Allergies: Negative.  Does not bruise/bleed easily.  Psychiatric/Behavioral: Negative.  Negative for depression and memory loss. The patient is not nervous/anxious and does not have insomnia.   All other systems reviewed and are negative.  Performance status (ECOG): 1-2  Vitals Blood pressure 119/62, pulse (!) 53, temperature (!) 96.5 F (35.8 C), temperature source Tympanic, resp. rate 18, height _0  (1.626 m), weight 100 lb 8.5 oz (45.6 kg), SpO2 98 %.   Physical Exam  Constitutional: She is oriented to person, place, and time. She appears well-developed and well-nourished. No distress.  Thin elderly female sitting comfortably in the exam room in no acute distress.   HENT:  Head: Normocephalic and atraumatic.  Right Ear: External ear normal.  Mouth/Throat: No oropharyngeal exudate.  Short gray hair.  Dentures.  Mask.  Eyes: Pupils are equal, round, and reactive to light. Conjunctivae and EOM are normal. No scleral icterus.  Glasses.  Light hazel eyes.   Neck: Normal range of motion. Neck supple. No JVD present.  Cardiovascular: Normal rate and normal heart sounds.  Pulmonary/Chest: Effort normal and breath sounds normal.  No respiratory distress. She has no wheezes. She has no rales.  Few scattered crackles left lower lobe.  Abdominal: Soft. Bowel sounds are normal. She exhibits no distension. There is no abdominal tenderness. There is no rebound and no guarding.  Musculoskeletal: Normal range of motion.        General: No tenderness or edema.  Lymphadenopathy:    She has no cervical adenopathy.    She has no axillary adenopathy.       Right: No supraclavicular adenopathy present.       Left: No supraclavicular adenopathy present.  Neurological: She is alert and oriented to person, place, and time.  Skin: Skin is warm and dry. No rash noted. She is not diaphoretic. No erythema.  Psychiatric: She has a normal mood and affect. Her behavior is normal. Judgment and thought content normal.  Nursing note and vitals reviewed.   Admission on 10/30/2018, Discharged on 10/30/2018  Component Date Value Ref Range Status  . CYTOLOGY - NON GYN 10/30/2018    Final                   Value:Cytology - Non PAP CASE: [id number] PATIENT: Amy Stone Non-Gyn Cytology Report     SPECIMEN SUBMITTED: A. Subcarinal node; FNA  CLINICAL HISTORY: New 2.5 cm LLL nodule and subcarinal adenopathy/mass. In 2016 right breast carcinoma pT1b pN0, grade 1, ER positive, HER negative by FISH, lumpectomy and rad rx.  PRE-OPERATIVE DIAGNOSIS: None provided  POST-OPERATIVE DIAGNOSIS: Endobronchial lesion left mainstem bronchus, enlarged subcarinal node and subcarinal mass.    DIAGNOSIS: A. SUBCARINAL LYMPH NODE; EBUS-GUIDED FNA: - POSITIVE FOR MALIGNANCY. - SQUAMOUS CELL CARCINOMA.  Comment: Keratinizing squamous cell carcinoma is present. The cell block contains some tumor tissue but not as much as [phone]. Definite nodal tissue is not identified.  See also reports on concurrent left mainstem samples ([id number], [id number] and -[id number]) and subcarinal mass EBUS FNA ([id number]).  The diagnosis was  communicated to Dr. Ashby Dawes on 10/31/2018                          via [place].  GROSS DESCRIPTION: A. Site: Subcarinal node Procedure: Endobronchial ultrasound Cytotechnologist: Molli Barrows and Vance Peper Specimen(s) collected: 2 Diff Quik stained slides 2 Pap stained slides Specimen labeled: Subcarinal lymph node, FNA                      Volume: 20 mL                      Description: Red, cloudy CytoLyt solution admixed with wispy tissue fragments                      Submitted for: ThinPrep and cell block    Final Diagnosis performed by Bryan Lemma, MD.   Electronically signed 10/31/2018 6:03:22PM The electronic signature indicates that the named Attending Pathologist has evaluated the specimen  Technical component performed at [facility], [address] [place], [place] [address] Lab: [phone] Dir: Rush Farmer, MD, MMM  Professional component performed at [facility], [facility], [place], [place], [place] [address] Lab: [phone] Dir: Dellia Nims. Rubinas, MD   . CYTOLOGY - NON GYN 10/30/2018    Final                   Value:Cytology - Non PAP CASE: [id number] PATIENT: Amy Stone Non-Gyn Cytology Report     SPECIMEN SUBMITTED: A. Subcarinal mass; FNA  CLINICAL HISTORY: New 2.5 cm LLL nodule and subcarinal adenopathy/mass. In 2016 right breast carcinoma pT1b pN0, grade 1, ER positive, HER negative by FISH, lumpectomy and rad rx.  PRE-OPERATIVE DIAGNOSIS: None provided  POST-OPERATIVE DIAGNOSIS: Endobronchial lesion left mainstem bronchus, enlarged subcarinal node and subcarinal mass.    DIAGNOSIS: A. SUBCARINAL MASS; EBUS-GUIDED FNA: - POSITIVE FOR MALIGNANCY. - SQUAMOUS CELL CARCINOMA.  Comment: Keratinizing squamous cell carcinoma is present. The sample may represent nodal metastasis, but there is only a tiny amount of nodal tissue present. The cell block contains a high proportion of tumor tissue  which  appears well preserved, and would be suitable for molecular genetic testing if needed.  See also reports from the concurrent left mainstem ([id number], [id number], and -378) and subcarinal node EBUS FNA sample ([id number]).  The diagnosis was communicated to Dr. Ashby Dawes on 10/31/2018 via [place].  GROSS DESCRIPTION: A. Site: Subcarinal mass Procedure: Endobronchial ultrasound Cytotechnologist: Molli Barrows and Vance Peper Specimen(s) collected: 2 Diff Quik stained slides 2 Pap stained slides Specimen labeled: Subcarinal mass, FNA                      Volume: 20 mL                      Description: Red, cloudy CytoLyt solution admixed with wispy tissue fragments                      Submitted for: ThinPrep and cell block   Final Diagnosis performed by Bryan Lemma, MD.   Electronically signed 10/31/2018 6:01:55PM The electronic signature indicates that the named Attending Pathologist has evaluated the specimen  Technical component performed at [facility], [address] [place], [place] [address] Lab: [phone] Dir: Rush Farmer, MD, MMM  Professional component performed at [facility], [facility], [place], [place], [place] [address] Lab: [phone] Dir: Dellia Nims. Reuel Derby, MD   . CYTOLOGY - NON GYN 10/30/2018    Final                   Value:Cytology - Non PAP CASE: [id number] PATIENT: Amy Stone Non-Gyn Cytology Report     SPECIMEN SUBMITTED: A. Lung, left mainstem bronchus; lavage  CLINICAL HISTORY: New 2.5 cm LLL nodule and subcarinal adenopathy/mass. In 2016 right breast carcinoma pT1b pN0, grade 1, ER positive, HER negative by FISH, lumpectomy and rad rx.  PRE-OPERATIVE DIAGNOSIS: None provided  POST-OPERATIVE DIAGNOSIS: Endobronchial lesion left mainstem bronchus, enlarged subcarinal node and subcarinal mass.    DIAGNOSIS: A. LUNG, LEFT  MAINSTEM BRONCHUS; BRONCHOALVEOLAR LAVAGE: - POSITIVE FOR MALIGNANCY. - SQUAMOUS CELL CARCINOMA.  Comment: Keratinizing squamous cell carcinoma is present. Tumor cells are present in the cell block but are somewhat degenerated; this would not be the best specimen for ancillary molecular genetic testing.  See reports from the concurrent left main endobronchial forceps biopsy and brushing ([id number], [phone]) and subcarinal EBUS FNA s                         amples ([id number] and -[id number]).  GROSS DESCRIPTION: A. Site: Left mainstem Procedure: Bronchoscopy Cytotechnologist: Molli Barrows and Vance Peper Specimen labeled: Left mainstem, BAL                      Volume: 14 mL                      Description: Red, cloudy fluid admixed with wispy tissue fragments in a BAL collection device                      Submitted for: ThinPrep and cell block   Final Diagnosis performed by Bryan Lemma, MD.  Electronically signed 10/31/2018 5:52:46PM The electronic signature indicates that the named Attending Pathologist has evaluated the specimen  Technical component performed at [facility], [address] [place], [place] [address] Lab: [phone] Dir: Rush Farmer, MD, MMM  Professional component performed at [facility], [facility], [place], [place], [place] [address] Lab: [phone] Dir: Dellia Nims. Reuel Derby, MD   . CYTOLOGY - NON GYN 10/30/2018    Final                   Value:Cytology - Non PAP CASE: [id number] PATIENT: Amy Stone Non-Gyn Cytology Report     SPECIMEN SUBMITTED: A. Lung, left mainstem bronchus; brushing  CLINICAL HISTORY: New 2.5 cm LLL nodule and subcarinal adenopathy/mass. In 2016 right breast carcinoma pT1b pN0, grade 1, ER positive, HER negative by FISH, lumpectomy and rad rx.  PRE-OPERATIVE DIAGNOSIS: None provided  POST-OPERATIVE DIAGNOSIS: Endobronchial lesion left mainstem bronchus, enlarged subcarinal node  and subcarinal mass.     DIAGNOSIS: A. LUNG, LEFT MAINSTEM BRONCHUS; BRONCHOSCOPY BRUSHING: - POSITIVE FOR MALIGNANCY. - SQUAMOUS CELL CARCINOMA.  Comment: Keratinizing squamous cell carcinoma is present. Tumor cells are present in this cell block but probably not enough for ancillary molecular genetic testing.  See reports from the concurrent left main endobronchial forceps biopsy and BAL ([id number], [id number]) and subcarinal EBUS FNA samples ([id number] and -[id number]).  GROSS D                         ESCRIPTION: A. Site: Left mainstem Procedure: Bronchoscopy Cytotechnologist: Molli Barrows and Vance Peper Specimen(s) collected: 4 Diff Quik stained slides Specimen labeled: Left mainstem, brush                      Volume: 20 mL                      Description: Clear CytoLyt solution admixed with minimal, wispy tissue fragments and 1 collection brush                      Submitted for: ThinPrep and cell block   Final Diagnosis performed by Bryan Lemma, MD.   Electronically signed 10/31/2018 5:53:40PM The electronic signature indicates that the named Attending Pathologist has evaluated the specimen  Technical component performed at [facility], [address], [place], [place] [address] Lab: [phone] Dir: Rush Farmer, MD, MMM  Professional component performed at [facility], [facility], [place], [place], [place] [address] Lab: [phone] Dir: Dellia Nims. Reuel Derby, MD   . Specimen Description 10/30/2018    Final                   Value:BRONCHIAL ALVEOLAR LAVAGE Performed at [facility], [address] [place],  [address]   . Special Requests 10/30/2018    Final                   Value:LEFT MAIN STEM BRONCHUS Performed at [facility], [address] [place],  [address]   . Gram Stain 10/30/2018    Final                   Value:MODERATE WBC PRESENT, PREDOMINANTLY PMN NO ORGANISMS SEEN   . Culture  10/30/2018    Final                   Value:NO GROWTH 2 DAYS Performed at [facility], [place] [address], [place], [place] [address]   . Report Status 10/30/2018 PENDING   Incomplete  . AFB Specimen Processing 10/30/2018 Concentration   Final  . Acid Fast Smear 10/30/2018 Negative   Final   Comment: (NOTE) Performed At: [facility] [place], [place] [id number] Rush Farmer MD UE:[id number]   . Source (AFB) 10/30/2018 BRONCHIAL ALVEOLAR LAVAGE   Final   Performed at [facility], [address] [place], [place] [address]  . SURGICAL PATHOLOGY 10/30/2018    Final                   Value:Surgical Pathology CASE: [id number] PATIENT: Amy Stone Surgical Pathology Report     SPECIMEN SUBMITTED: A. Lung, left mainstem bronchus; forceps biopsy  CLINICAL HISTORY: New 2.5 cm LLL nodule and subcarinal adenopathy/mass. In 2016 right breast carcinoma pT1b pN0, grade 1, ER positive, HER negative by FISH, lumpectomy and rad rx.  PRE-OPERATIVE DIAGNOSIS: None provided  POST-OPERATIVE DIAGNOSIS: Endobronchial lesion left mainstem bronchus, enlarged subcarinal node and subcarinal mass.    DIAGNOSIS: A. LUNG, LEFT MAINSTEM BRONCHUS; ENDOBRONCHIAL FORCEPS BIOPSY: - SQUAMOUS CELL CARCINOMA.  Comment: Keratinizing squamous cell carcinoma is present. See also the cytology reports on concurrent samples from this site ([id number] and -[id number]). See also the cytology reports on concurrent subcarinal EBUS FNA samples ([id number] and -[id number]).  There is adequate tumor tissue in this specimen for PD-L1 testing. The tumor proportion may not be sufficient                          for ancillary molecular genetic testing. The cell block from the subcarinal mass FNA ([id number]) may be the best sample for genetic testing if needed.  The diagnosis was communicated to Dr. Ashby Dawes on 10/31/2018 via [place] chat.  GROSS DESCRIPTION: A.  Labeled: Left mainstem, forceps biopsy Received: The specimen is retrieved via bronchoscopy and placed into formalin. Tissue fragment(s): Multiple Size: Aggregate, 0.6 x 0.4 x 0.1 cm Description: Received are irregular fragments of tan-pink soft tissue. At the time of procedure, 2 Diff-Quik stained slides are performed.  The remainder of the specimen is filtered into a mesh bag and submitted entirely for routine histology in cassette 1.   Final Diagnosis performed by Bryan Lemma, MD.   Electronically signed 10/31/2018 5:51:13PM The electronic signature indicates that the named Attending Pathologist has evaluated the specimen  Technical component performed at [facility], [address] [place], [place] [address]                          Lab: [phone] Dir: Rush Farmer, MD, MMM  Professional component performed at [facility], [facility], [place], [place], [place] [address] Lab: [phone] Dir: Dellia Nims. Rubinas, MD     Assessment:  Amy Stone is a 78 y.o. female with stage IB right breast cancers/p partial mastectomyon 06/02/2014. 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 Tumor was ER positive (>90%), PR positive (1-10%), and Her2/neu negative by FISH. Two sentinel lymph nodes were negative. Pathologic stage was T1bN0M0.  She received 0940 cGyto the right breast from 07/01/2014 - 07/22/2014 and 1440 cGyto the scar from 07/23/2014 - 08/05/2014.  She began Femaraafter completing radiation.Bilateral diagnostic mammogramon 05/30/2018 revealed no mammographic evidence of malignancy in either breast.There were stable right breast posttreatment changes.  CA27.29has been followed: 8.5 on 03/24/2016, 6.9 on 08/07/2016, 8.4 on 02/06/2017,  11.9 on 08/07/2017, 8.4 on 02/12/2018, and 28.4 on 09/25/2018.  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  Findings  revealed an endobronchial lesion in the left mainstem bronchus with 20% occlusion of the left mainstem bronchus.  There was an enlarged subcarinal node as well as a subcarinal mass.  Pathology confirmed squamous cell carcinoma.  Chest CT without contrast on 10/10/2018 revealed a 2.5 cm nodule in the left lower lobe.  There was evidence for extensive mediastinal lymphadenopathy with a 3.8 cm posterior mediastinal mass or lymph node. Findings were compatible with primary lung cancer.   Bone density on 10/08/2014 revealed osteoporosiswith a T score of -3.2 in the right femur and -2.4 in L1-L4. She was briefly on weeklyFosamax. She is not interested in any further treatment.  She has a history of non-invasive bladder cancer. She undergoes yearly cystoscopy.  Symptomatically, she feels "fine".  She is not interested in receiving therapy.  She is interested in Hospice.  Plan: 1.   Clinical stage III squamous cell lung cancer  Review interval chest CT with patient.   Imaging personally reviewed.  Agree with radiology interpretation.   She has a LLL nodule and mediastinal adenopathy.  Discuss findings of bronchoscopy and pathology   Discuss squamous cell lung cancer.  Discuss staging studies to determine extent of disease and treatment plan.   Recommend PET scan and head MRI.    Patient declines imaging.  Discuss treatment for stage III disease versus stage IV disease.   Discuss concurrent chemotherapy (weekly Taxol) and radiation for stage III disease    Followed by maintenance durvalumab x 1 year.   Discuss systemic chemotherapy/immunotherapy for stage IV disease.   Patient adamantly stated that she did NOT want any treatment.    She stated "leave it like it is" (do nothing).   Discuss palliative radiation.  Patient declined.  Discuss symptom management and Hospice.   Patient is considering.   Discuss follow-up in clinic.   Discuss option to change her mind in the future if she wishes  to pursue treatment.  She stated she would talk it over with her family.  Multiple questions were asked and answered. 2. Stage IB right breast cancer Clinically, she is doing well. No evidence of recurrent disease. Patient on Femara. 3.   RTC in 1 month for MD assessment.  I discussed the assessment and treatment plan with the patient.  The patient was provided an opportunity to ask questions and all were answered.  The patient agreed with the plan and demonstrated an understanding of the instructions.  The patient was advised to call back if the symptoms worsen or if the condition fails to improve as anticipated.  I provided 25 minutes of face-to-face time during this this encounter and > 50% was spent counseling as documented under my assessment and plan.    Lequita Asal, MD, PhD    11/01/2018, 11:21 AM  I, Molly Dorshimer, am acting as Education administrator for [employer]. Mike Gip, MD, PhD.  I, Melissa C. Mike Gip, MD, have reviewed the above documentation for accuracy and completeness, and I agree with the above.

## 2018-10-30 NOTE — Interval H&P Note (Signed)
History and Physical Interval Note:  10/30/2018 1:05 PM  Amy Stone  has presented today for surgery, with the diagnosis of MEDIASTINAL LYMPHADENOPATHY.  The various methods of treatment have been discussed with the patient and family. After consideration of risks, benefits and other options for treatment, the patient has consented to  Procedure(s): ENDOBRONCHIAL ULTRASOUND (N/A) as a surgical intervention.  The patient's history has been reviewed, patient examined, no change in status, stable for surgery.  I have reviewed the patient's chart and labs.  Questions were answered to the patient's satisfaction.     Laverle Hobby

## 2018-10-30 NOTE — Anesthesia Postprocedure Evaluation (Signed)
Anesthesia Post Note  Patient: Amy Stone  Procedure(s) Performed: ENDOBRONCHIAL ULTRASOUND (N/A )  Patient location during evaluation: PACU Anesthesia Type: General Level of consciousness: awake and alert Pain management: pain level controlled Vital Signs Assessment: post-procedure vital signs reviewed and stable Respiratory status: spontaneous breathing, nonlabored ventilation, respiratory function stable and patient connected to nasal cannula oxygen Cardiovascular status: blood pressure returned to baseline and stable Postop Assessment: no apparent nausea or vomiting Anesthetic complications: no     Last Vitals:  Vitals:   10/30/18 1424 10/30/18 1439  BP: (!) 169/53 (!) 178/67  Pulse: 77 78  Resp: (!) 22 19  Temp: (!) 36.2 C   SpO2: 100% 93%    Last Pain:  Vitals:   10/30/18 1439  TempSrc:   PainSc: 0-No pain                 Astoria Condon S

## 2018-10-30 NOTE — Anesthesia Procedure Notes (Addendum)
Procedure Name: Intubation Date/Time: 10/30/2018 1:20 PM Performed by: Dionne Bucy, CRNA Pre-anesthesia Checklist: Patient identified, Patient being monitored, Timeout performed, Emergency Drugs available and Suction available Patient Re-evaluated:Patient Re-evaluated prior to induction Oxygen Delivery Method: Circle system utilized Preoxygenation: Pre-oxygenation with 100% oxygen Induction Type: IV induction Ventilation: Mask ventilation without difficulty Laryngoscope Size: 3 and McGraph Grade View: Grade I Tube type: Oral Tube size: 8.0 mm Number of attempts: 1 Airway Equipment and Method: Stylet and Video-laryngoscopy Placement Confirmation: ETT inserted through vocal cords under direct vision,  positive ETCO2 and breath sounds checked- equal and bilateral Secured at: 21 cm Tube secured with: Tape Dental Injury: Teeth and Oropharynx as per pre-operative assessment

## 2018-10-30 NOTE — Transfer of Care (Signed)
Immediate Anesthesia Transfer of Care Note  Patient: Amy Stone  Procedure(s) Performed: ENDOBRONCHIAL ULTRASOUND (N/A )  Patient Location: PACU  Anesthesia Type:General  Level of Consciousness: sedated  Airway & Oxygen Therapy: Patient Spontanous Breathing and Patient connected to face mask oxygen  Post-op Assessment: Report given to RN and Post -op Vital signs reviewed and stable  Post vital signs: Reviewed and stable  Last Vitals:  Vitals Value Taken Time  BP 169/53 10/30/18 1424  Temp    Pulse 119 10/30/18 1425  Resp 27 10/30/18 1425  SpO2 100 % 10/30/18 1425  Vitals shown include unvalidated device data.  Last Pain:  Vitals:   10/30/18 1224  TempSrc: Oral  PainSc: 0-No pain         Complications: No apparent anesthesia complications

## 2018-10-30 NOTE — Discharge Instructions (Signed)
anesFlexible Bronchoscopy, Care After This sheet gives you information about how to care for yourself after your test. Your doctor may also give you more specific instructions. If you have problems or questions, contact your doctor. Follow these instructions at home: Eating and drinking  Do not eat or drink anything (not even water) for 2 hours after your test, or until your numbing medicine (local anesthetic) wears off.  When your numbness is gone and your cough and gag reflexes have come back, you may: ? Eat only soft foods. ? Slowly drink liquids.  The day after the test, go back to your normal diet. Driving  Do not drive for 24 hours if you were given a medicine to help you relax (sedative).  Do not drive or use heavy machinery while taking prescription pain medicine. General instructions   Take over-the-counter and prescription medicines only as told by your doctor.  Return to your normal activities as told. Ask what activities are safe for you.  Do not use any products that have nicotine or tobacco in them. This includes cigarettes and e-cigarettes. If you need help quitting, ask your doctor.  Keep all follow-up visits as told by your doctor. This is important. It is very important if you had a tissue sample (biopsy) taken. Get help right away if:  You have shortness of breath that gets worse.  You get light-headed.  You feel like you are going to pass out (faint).  You have chest pain.  You cough up: ? More than a little blood. ? More blood than before. Summary  Do not eat or drink anything (not even water) for 2 hours after your test, or until your numbing medicine wears off.  Do not use cigarettes. Do not use e-cigarettes.  Get help right away if you have chest pain. This information is not intended to replace advice given to you by your health care provider. Make sure you discuss any questions you have with your health care provider. Document Released:  01/29/2009 Document Revised: 03/16/2017 Document Reviewed: 04/21/2016 Elsevier Patient Education  2020 [employer].

## 2018-10-30 NOTE — Op Note (Signed)
  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  Forceps, brushings, BAL were performed in this area. - Enlarged subcarinal node, as well as subcarinal mass, both of which were sampled.  Left hilar node could not be reached due to narrowing of the left mainstem.  Indication: Mediastinal lymphadenopathy. The patient (or their representative) was informed of the risks (including but not limited to bleeding, infection, respiratory failure, lung injury, tooth/oral injury) and benefits of the procedure and gave consent, see chart.   Pre-op diagnosis: Mediastinal lymphadenopathy Post-op diagnosis: As above with endobronchial lesion Estimated blood loss: 5 cc  Medications for procedure: Please see anesthesia notes for details.  Procedure description: Patient was sedated and intubated by anesthesia services, please see their notes for further details.  After obtaining informed consent a timeout was called to confirm patient and procedure.  The EVIS bronchoscope was passed by the endotracheal tube to the mainstem carina, enlarged subcarinal lymph node was seen in the subcarinal region from the right mainstem bronchus this was sampled x3 with good returns.  Bronchoscope was then taken to the left mainstem bronchus where subcarinal lymph node/mass were appreciated, 4 passes were taken with good returns.  The bronchoscope was taken to the left hilar station, however there was difficulty in passing the EBUS scope through the left mainstem bronchus due to narrowing.  The EVIS scope was removed  The white light bronchoscope was then passed, an anatomical tour was undertaken.  The right side was evaluated, there was moderate mucosal secretions throughout both lungs, there was moderate mucosal edema, secretions were suctioned.  The right lung appeared to be  normal with normal airways and no anatomical abnormalities.  In the left lung there was a exophytic lesion, which appeared to be protruding from the medial/inferior wall of the left mainstem bronchus, and was likely continuous with the subcarinal mass.  This caused approximately 20% to 30% occlusion of the left mainstem bronchus.  However the white light scope could be passed distal to this, the entry to the left lower lobe bronchus was narrowed due to external compression in the left lower lobe bronchus could not be entered. Brushings were taken of the abnormal mucosa/lesion in the left mainstem bronchus, followed by forceps biopsies, subsequently washings were taken.  As adequate samples appear to have been taken the bronchoscope was removed and the procedure was terminated.    Condition post procedure: stable   Complications: none noted    Deep Ashby Dawes, M.D., F.C.C.P. Board Certified in Internal Medicine, Pulmonary Medicine, [place], and Sleep Medicine.  [facility] Office Number: [phone]  10/30/2018

## 2018-10-31 ENCOUNTER — Encounter: Payer: Self-pay | Admitting: Internal Medicine

## 2018-10-31 LAB — SURGICAL PATHOLOGY

## 2018-10-31 LAB — CYTOLOGY - NON PAP

## 2018-11-01 ENCOUNTER — Other Ambulatory Visit: Payer: Self-pay

## 2018-11-01 ENCOUNTER — Inpatient Hospital Stay: Payer: Medicare HMO | Attending: Hematology and Oncology | Admitting: Hematology and Oncology

## 2018-11-01 ENCOUNTER — Encounter: Payer: Self-pay | Admitting: Hematology and Oncology

## 2018-11-01 VITALS — BP 119/62 | HR 53 | Temp 96.5°F | Resp 18 | Ht 64.0 in | Wt 100.5 lb

## 2018-11-01 DIAGNOSIS — Z79811 Long term (current) use of aromatase inhibitors: Secondary | ICD-10-CM | POA: Diagnosis not present

## 2018-11-01 DIAGNOSIS — Z170 Estrogen receptor positive status [ER+]: Secondary | ICD-10-CM

## 2018-11-01 DIAGNOSIS — Z8551 Personal history of malignant neoplasm of bladder: Secondary | ICD-10-CM | POA: Diagnosis not present

## 2018-11-01 DIAGNOSIS — C50911 Malignant neoplasm of unspecified site of right female breast: Secondary | ICD-10-CM

## 2018-11-01 DIAGNOSIS — I252 Old myocardial infarction: Secondary | ICD-10-CM | POA: Diagnosis not present

## 2018-11-01 DIAGNOSIS — C3431 Malignant neoplasm of lower lobe, right bronchus or lung: Secondary | ICD-10-CM

## 2018-11-01 DIAGNOSIS — Z87891 Personal history of nicotine dependence: Secondary | ICD-10-CM | POA: Diagnosis not present

## 2018-11-01 DIAGNOSIS — C50919 Malignant neoplasm of unspecified site of unspecified female breast: Secondary | ICD-10-CM | POA: Diagnosis not present

## 2018-11-01 DIAGNOSIS — E039 Hypothyroidism, unspecified: Secondary | ICD-10-CM | POA: Diagnosis not present

## 2018-11-01 DIAGNOSIS — Z79899 Other long term (current) drug therapy: Secondary | ICD-10-CM

## 2018-11-01 DIAGNOSIS — C3432 Malignant neoplasm of lower lobe, left bronchus or lung: Secondary | ICD-10-CM | POA: Insufficient documentation

## 2018-11-01 DIAGNOSIS — Z803 Family history of malignant neoplasm of breast: Secondary | ICD-10-CM | POA: Diagnosis not present

## 2018-11-01 DIAGNOSIS — J449 Chronic obstructive pulmonary disease, unspecified: Secondary | ICD-10-CM | POA: Diagnosis not present

## 2018-11-01 DIAGNOSIS — I10 Essential (primary) hypertension: Secondary | ICD-10-CM | POA: Diagnosis not present

## 2018-11-01 DIAGNOSIS — Z7982 Long term (current) use of aspirin: Secondary | ICD-10-CM

## 2018-11-01 DIAGNOSIS — Z7189 Other specified counseling: Secondary | ICD-10-CM

## 2018-11-01 DIAGNOSIS — C3492 Malignant neoplasm of unspecified part of left bronchus or lung: Secondary | ICD-10-CM | POA: Insufficient documentation

## 2018-11-01 DIAGNOSIS — E785 Hyperlipidemia, unspecified: Secondary | ICD-10-CM | POA: Diagnosis not present

## 2018-11-01 LAB — ACID FAST SMEAR (AFB, MYCOBACTERIA): Acid Fast Smear: NEGATIVE

## 2018-11-01 NOTE — Progress Notes (Signed)
Tender to touch to right breast / the patient states it is nothing new but she has bee unable to wear a bra in about 1 week

## 2018-11-02 LAB — CULTURE, BAL-QUANTITATIVE W GRAM STAIN: Culture: NO GROWTH

## 2018-11-07 ENCOUNTER — Telehealth: Payer: Self-pay

## 2018-11-07 NOTE — Telephone Encounter (Signed)
New patient referral has been faxed to hospice care [facility])  fax conformation recived.

## 2018-11-13 ENCOUNTER — Other Ambulatory Visit: Payer: Self-pay | Admitting: Hematology and Oncology

## 2018-11-13 ENCOUNTER — Telehealth: Payer: Self-pay

## 2018-11-13 NOTE — Telephone Encounter (Signed)
received a message from robin for fyi the patient will be elvauated  on Friday for hospice care. ( [facility])

## 2018-11-15 ENCOUNTER — Other Ambulatory Visit: Payer: Self-pay

## 2018-11-15 ENCOUNTER — Other Ambulatory Visit: Payer: Self-pay | Admitting: *Deleted

## 2018-11-15 MED ORDER — TRAMADOL HCL 50 MG PO TABS: 50.0000 mg | ORAL_TABLET | Freq: Four times a day (QID) | ORAL | 0 refills | Status: AC | PRN

## 2018-11-15 MED ORDER — GUAIFENESIN-CODEINE 100-10 MG/5ML PO SOLN: 5.0000 mL | Freq: Three times a day (TID) | ORAL | 0 refills | Status: DC | PRN

## 2018-11-15 NOTE — Telephone Encounter (Signed)
Patient was opened to hospice services yesterday and they report that she is having pain and that the benzonatate is not working for her. They are requesting Tramadol and lorazepam prescription for pain and Robitussin with codeine every 4 hours as needed for her cough. Please advise

## 2018-11-15 NOTE — Telephone Encounter (Signed)
  I just signed her papers for standard [facility] meds.  Do we need to do anything else?  Can you send the Rxs to me that are needed (if not covered)?  M

## 2018-11-20 ENCOUNTER — Ambulatory Visit: Payer: Medicare HMO | Admitting: Radiation Oncology

## 2018-11-26 ENCOUNTER — Other Ambulatory Visit: Payer: Self-pay

## 2018-11-27 MED ORDER — GUAIFENESIN-CODEINE 100-10 MG/5ML PO SOLN: 5.0000 mL | Freq: Three times a day (TID) | ORAL | 0 refills | Status: AC | PRN

## 2018-11-28 LAB — FUNGUS CULTURE WITH STAIN

## 2018-11-28 LAB — FUNGAL ORGANISM REFLEX

## 2018-11-28 LAB — FUNGUS CULTURE RESULT

## 2018-11-29 ENCOUNTER — Other Ambulatory Visit: Payer: Self-pay

## 2018-12-01 NOTE — Assessment & Plan Note (Signed)
High risk, on high dose statin lipitor 80 mg daily, good control.

## 2018-12-01 NOTE — Assessment & Plan Note (Signed)
Low normal today. Off and on lightheaded.. may have to hold BP meds.

## 2018-12-01 NOTE — Assessment & Plan Note (Signed)
Stable on levo.

## 2018-12-01 NOTE — Assessment & Plan Note (Signed)
perpt no change inSOb but does have new persistent cough.  Cannot afford spiriva or advair.

## 2018-12-01 NOTE — Assessment & Plan Note (Signed)
Needs further eval with CXR. Concern for lung cancer given smoking history and weight loss, chest pain

## 2018-12-02 ENCOUNTER — Ambulatory Visit: Payer: Medicare HMO | Admitting: Hematology and Oncology

## 2018-12-04 ENCOUNTER — Other Ambulatory Visit: Payer: Self-pay | Admitting: Oncology

## 2018-12-04 MED ORDER — MORPHINE SULFATE (CONCENTRATE) 20 MG/ML PO SOLN: 20.0000 mg | ORAL | 0 refills | Status: AC | PRN

## 2018-12-04 NOTE — Progress Notes (Signed)
Prescription sent for morphine concentrate 20 mg per 1 mL per hospice.  Faythe Casa, NP 12/04/2018 9:46 AM

## 2018-12-17 DEATH — deceased

## 2018-12-18 LAB — ACID FAST CULTURE WITH REFLEXED SENSITIVITIES (MYCOBACTERIA): Acid Fast Culture: NEGATIVE

## 2019-12-13 IMAGING — DX CHEST  1 VIEW
1 series · 1 of 1 positions shown · non-contrast
Comparison: Chest x-ray 10/03/2018, chest CT 10/10/2018

CLINICAL DATA: Post lung biopsy.  Lung lesion

EXAM:
CHEST  1 VIEW

[chest ap]
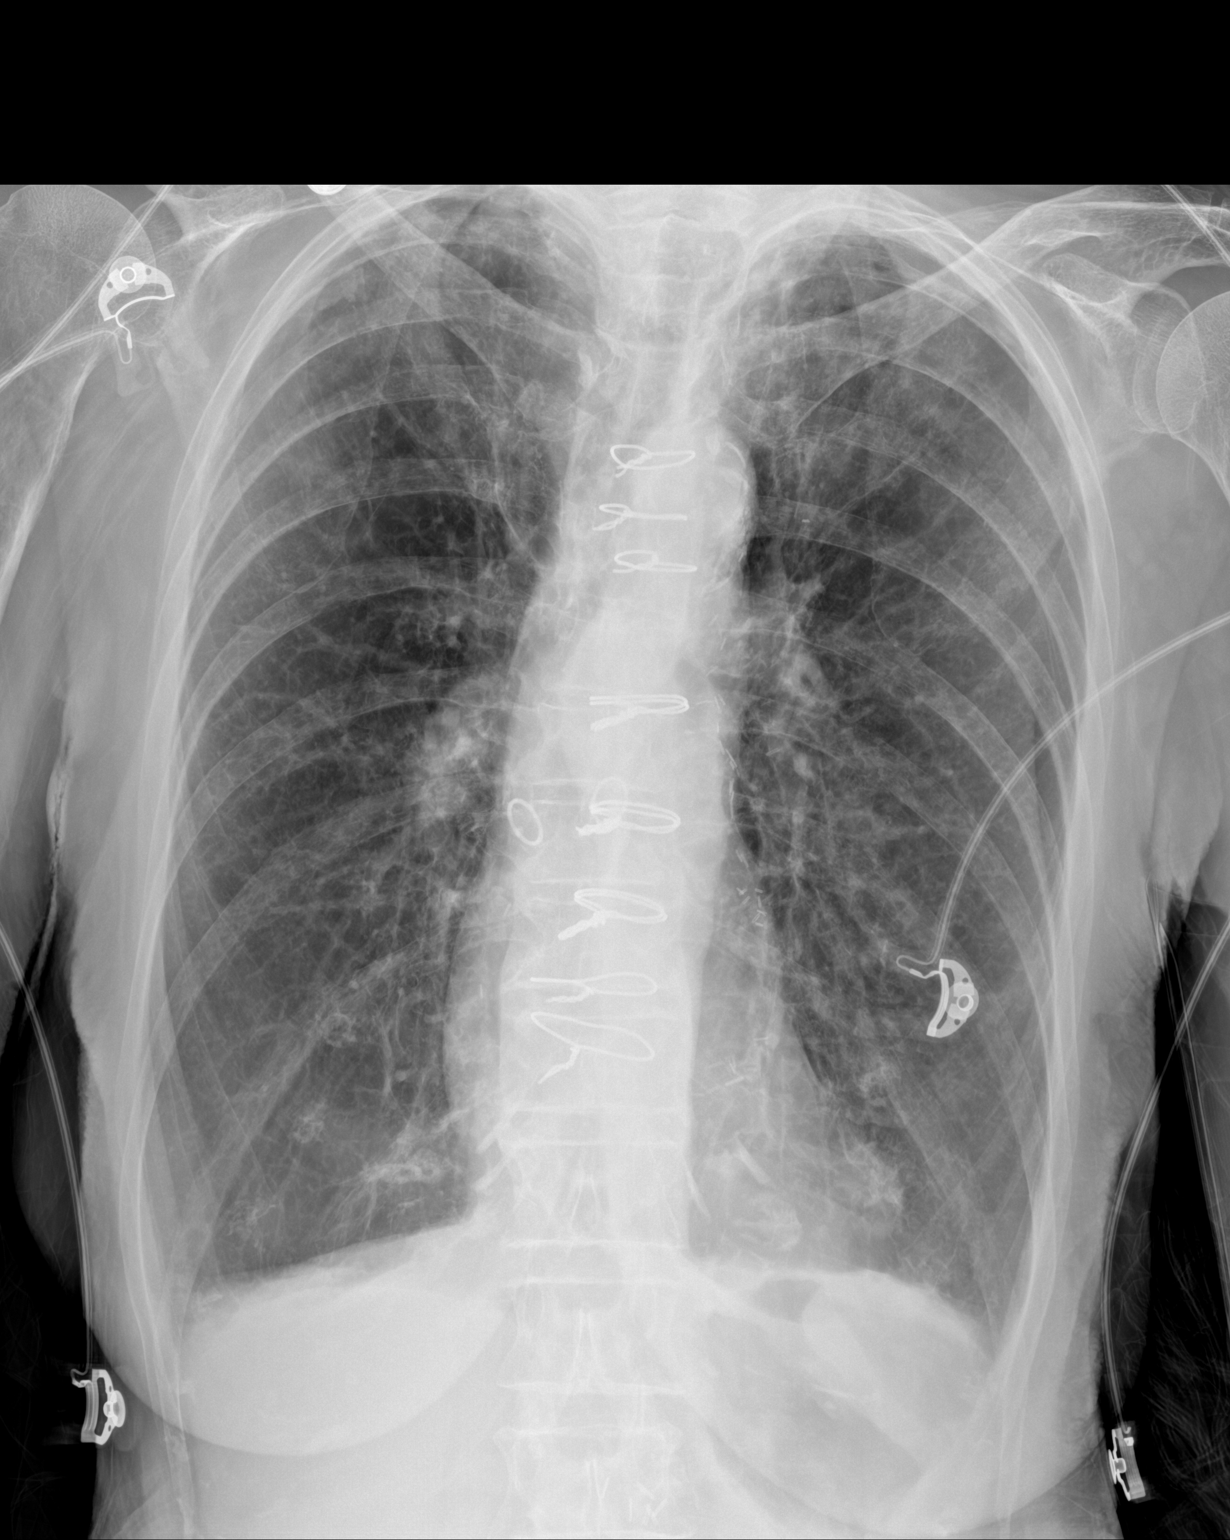

[1 of 1 positions shown; findings below may reference images not displayed]

FINDINGS: Skin filled overlying the left lateral chest. No pneumothorax. No
significant pleural effusion

Severe COPD.  Postop CABG.  Negative for heart failure.

Left lower lobe nodule best seen on prior studies especially CT.
IMPRESSION: No complication post lung biopsy.  Left lower lobe lung nodule.

## 2020-02-10 ENCOUNTER — Other Ambulatory Visit: Payer: Self-pay | Admitting: Family Medicine
